# Patient Record
Sex: Female | Born: 1960 | Race: Black or African American | Hispanic: No | Marital: Married | Smoking: Never smoker
Health system: Southern US, Community
[De-identification: ages and names within clinical notes are randomized; demographics above are authoritative.]

## PROBLEM LIST (undated history)

## (undated) ENCOUNTER — Ambulatory Visit: Payer: MEDICAID

## (undated) ENCOUNTER — Ambulatory Visit: Payer: MEDICAID | Attending: Women's Health

## (undated) DIAGNOSIS — R920 Mammographic microcalcification found on diagnostic imaging of breast: Secondary | ICD-10-CM

## (undated) HISTORY — DX: Mammographic microcalcification found on diagnostic imaging of breast: R92.0

## (undated) HISTORY — PX: KNEE SURGERY: SHX244

## (undated) MED FILL — BUDESONIDE DR-ER 9 MG TABLET,DELAYED AND EXTENDED RELEASE: 9 mg | ORAL | 30 days supply | Qty: 30 | Fill #0

## (undated) MED FILL — BUDESONIDE DR-ER 9 MG TABLET,DELAYED AND EXTENDED RELEASE: 9 mg | ORAL | 30 days supply | Qty: 15 | Fill #1

## (undated) MED FILL — BUDESONIDE DR-ER 9 MG TABLET,DELAYED AND EXTENDED RELEASE: 9 mg | ORAL | 30 days supply | Qty: 30 | Fill #1

---

## 1997-04-10 HISTORY — PX: CHOLECYSTECTOMY: SHX55

## 2007-04-11 HISTORY — PX: COLONOSCOPY: SHX174

## 2007-12-13 ENCOUNTER — Ambulatory Visit: Payer: Self-pay | Admitting: Obstetrics and Gynecology

## 2012-09-17 ENCOUNTER — Encounter: Payer: Self-pay | Admitting: General Surgery

## 2012-09-25 ENCOUNTER — Ambulatory Visit (INDEPENDENT_AMBULATORY_CARE_PROVIDER_SITE_OTHER): Payer: BC Managed Care – PPO | Admitting: General Surgery

## 2012-09-25 ENCOUNTER — Encounter: Payer: Self-pay | Admitting: General Surgery

## 2012-09-25 VITALS — BP 124/68 | HR 76 | Resp 14 | Ht 67.5 in | Wt 186.0 lb

## 2012-09-25 DIAGNOSIS — Z803 Family history of malignant neoplasm of breast: Secondary | ICD-10-CM

## 2012-09-25 DIAGNOSIS — R928 Other abnormal and inconclusive findings on diagnostic imaging of breast: Secondary | ICD-10-CM

## 2012-09-25 NOTE — Patient Instructions (Addendum)
Continue self breast exams. Call office for any new breast issues or concerns. Continue annual follow up mammogram with primary MD

## 2012-09-25 NOTE — Progress Notes (Signed)
Patient ID: Sierra Clark, female   DOB: December 28, 1960, 52 y.o.   MRN: [id number]  Chief Complaint  Patient presents with  . Follow-up    mammogram    HPI Sierra Clark is a 52 y.o. female.  Who presents for her annual follow up breast evaluation. The most recent mammogram was done on  09-16-12.  Patient does perform regular self breast checks and gets regular mammograms done. Family history of breast cancer includes a mother at age 76.   HPI  Past Medical History  Diagnosis Date  . Mammographic microcalcification     left    Past Surgical History  Procedure Laterality Date  . Cholecystectomy  1999  . Colonoscopy  2009    Family History  Problem Relation Age of Onset  . Breast cancer Mother 45    Social History History  Substance Use Topics  . Smoking status: Never Smoker   . Smokeless tobacco: Never Used  . Alcohol Use: No    No Known Allergies  No current outpatient prescriptions on file.   No current facility-administered medications for this visit.    Review of Systems Review of Systems  Constitutional: Negative.   Respiratory: Negative.   Cardiovascular: Negative.     Blood pressure 124/68, pulse 76, resp. rate 14, height 5' 7.5" (1.715 m), weight 186 lb (84.369 kg), last menstrual period 07/01/2012.  Physical Exam Physical Exam  Constitutional: She appears well-developed and well-nourished.  Neck: Neck supple.  Cardiovascular: Normal rate and regular rhythm.   Pulmonary/Chest: Effort normal and breath sounds normal. Right breast exhibits no inverted nipple, no mass, no nipple discharge, no skin change and no tenderness. Left breast exhibits no inverted nipple, no mass, no nipple discharge, no skin change and no tenderness.  Lymphadenopathy:    She has no cervical adenopathy.    She has no axillary adenopathy.  Neurological: She is alert.  Skin: Skin is warm and dry.    Data Reviewed Bilateral mammogram dated September 16, 2012 was reviewed. These films  were completed at [facility]. No areas of architectural distortions or microcalcifications. BI-RAD-1.  Assessment    Benign breast exam.     Plan    The patient should continue annual clinical exams with her gynecologist as well as annual mammograms to be scheduled through that office.        Earline Mayotte 09/26/2012, 9:00 PM

## 2012-09-26 ENCOUNTER — Encounter: Payer: Self-pay | Admitting: General Surgery

## 2012-09-26 DIAGNOSIS — R928 Other abnormal and inconclusive findings on diagnostic imaging of breast: Secondary | ICD-10-CM | POA: Insufficient documentation

## 2014-01-20 ENCOUNTER — Ambulatory Visit: Payer: Self-pay | Admitting: Gastroenterology

## 2014-01-21 LAB — PATHOLOGY REPORT

## 2014-02-09 ENCOUNTER — Encounter: Payer: Self-pay | Admitting: General Surgery

## 2016-12-21 DIAGNOSIS — Z720 Tobacco use: Secondary | ICD-10-CM

## 2016-12-21 DIAGNOSIS — M542 Cervicalgia: Secondary | ICD-10-CM

## 2016-12-21 DIAGNOSIS — M19019 Primary osteoarthritis, unspecified shoulder: Secondary | ICD-10-CM

## 2017-02-22 DIAGNOSIS — N393 Stress incontinence (female) (male): Secondary | ICD-10-CM

## 2017-11-29 DIAGNOSIS — M130 Polyarthritis, unspecified: Secondary | ICD-10-CM

## 2018-03-12 ENCOUNTER — Other Ambulatory Visit: Payer: Self-pay | Admitting: Family Medicine

## 2018-03-12 DIAGNOSIS — E2839 Other primary ovarian failure: Secondary | ICD-10-CM

## 2018-05-08 ENCOUNTER — Ambulatory Visit
Admission: RE | Admit: 2018-05-08 | Discharge: 2018-05-08 | Disposition: A | Discharge status: Home / Self Care | Payer: BC Managed Care – PPO | Source: Ambulatory Visit | Attending: Family Medicine | Admitting: Family Medicine

## 2018-05-08 DIAGNOSIS — E2839 Other primary ovarian failure: Secondary | ICD-10-CM

## 2018-05-09 DIAGNOSIS — F331 Major depressive disorder, recurrent, moderate: Secondary | ICD-10-CM

## 2018-11-27 NOTE — Progress Notes (Deleted)
Chelsea Ball is a 58 y.o. female seen via telehealth for a new visit consultation with chief complaint of No chief complaint on file.   at the request of None Per Patient Provider.    I performed this consultation using real-time Telehealth tools, including a live video connection between my location and the patient's location. Prior to initiating the consultation, I obtained informed verbal consent to perform this consultation using Telehealth tools and answered all the questions about the Telehealth interaction.       History of Present Illness  This is a 58 y.o. female with history of depression, cholecystectomy, hysterectomy, substance use, homelessness who is referred to gastroenterology for left upper quadrant pain.     For constipation she has been prescribed metamucil, colace, lactulose, Fleet's enemas, Amitiza 11mg BID    For reflux she has been prescribed famotidine, omeprazole, pantoprazole, Creon, Dexilant, Aciphex, Nexium,     For pain she has been prescribed Creon, Bentyl, hyoscyamine, desipramin 25mBID, Donnatal, Norco, tizanidine    She has been prescribed prozac and lyrica for anxiety. Other psychiatric medications in medical history include Risperdal, Lyrica (neuropathy), desipramine    Her gastroenterologist Dr. [facility]im[facility] history of diverticulitis, bloating believed due to distended lumen in splenic flexure and descending colon). Per outside gastroenterologist, was unable to pass splenic flexure/descending colon diverticulosis at attempted colonoscopy.     No past medical history on file.   No past surgical history on file.      No family history on file.   There is ***no family history of GI or liver diseases or malignancies.   No outpatient medications have been marked as taking for the 11/27/18 encounter (Appointment) with DaGuido SanderMD.      Allergies/Contraindications  Allergies not on file     Review of Systems  Gen: No  fevers/chills, no weight change  Cardiovascular: No chest pain, no syncope  All other systems were reviewed and are negative.     Physical Exam:  Observed via video:  Constitutional: Patient appears well-developed and well-nourished. Pleasant and appropriately interactive.  Head: Normocephalic and atraumatic.  Eyes: Conjunctivae and EOM are normal. No scleral icterus  Neck: Normal range of motion, no visible thyroid enlargement  Pulmonary/Chest: Effort normal. No respiratory distress. No cough.  Cardiovascular: No visible edema, face and upper extremities appear well perfused  Abdomen: No abdominal distension.  Musculoskeletal: Appears normal, no swelling or deformity, no temporal wasting  Neurological: Alert and oriented to person, place, and time. Cranial nerves grossly intact.   Psychiatric: Normal mood and affect. Behavior is normal. Judgment and thought content normal.  Skin: Normal color, no visible rash.      I have personally reviewed the labs, imaging and other diagnostics below:     Latest [facility]  No results found for: HGB, WBC, MCV, PLT, INR, AST, ALT, TBILI, ALKP, ESR, CRP    Latest Outside Labs  01/04/18   Hgb 13.8, Plt 277  Cr 0.47  TSH 1.33    05/29/18  Hgb 12.0, Plt 229  AST 28, ALT 19, alk phos 74, Tbili 0.4  HIV negative  TSH 3.34    05/29/18 UTox: Cocaine metabolite positive  10/30/17 drug tox oral fluid cocaine negative, cotinine postive  12/25/16 cocaine metabolite detected  01/25/16 cocaine metabolite detected    Latest Imaging and Other Diagnostics  07/23/18 Colonoscopy (abdominal pain)- moderate sedation:  Advanced to transverse colon. Prep was adequate  Comments:  Unable to advance past distal transverse colon. Diverticulosis noted.     07/05/18 Abdominal ultrasound (RUQ pain)  Impression: Fatty infiltration of the liver but without evidence of hepatomegaly, splenomegaly, portal hypertension, focal solid mass lesion, biliary ductal dilatation, ascites nor varices. S/p cholecystectomy w/o evidence  of biliary ductal dilatation or choledocholithiasis.     Colonoscopy 10/20/15 per outside summary of healthcare screening needs, no report available.   Upper endoscopy 08/05/18 (no report available)    Assessment and Plan  Problem List:        Summary of Recommendations:         Patient will follow up at ***    An after visit summary was provided to the patient.     CC: None Per Patient Provider

## 2019-01-06 ENCOUNTER — Ambulatory Visit: Admit: 2019-01-06 | Discharge: 2019-01-07 | Payer: MEDICAID

## 2019-01-21 ENCOUNTER — Ambulatory Visit
Admit: 2019-01-21 | Payer: MEDICAID | Attending: Student in an Organized Health Care Education/Training Program | Primary: Physician

## 2019-01-21 DIAGNOSIS — K579 Diverticulosis of intestine, part unspecified, without perforation or abscess without bleeding: Secondary

## 2019-01-21 DIAGNOSIS — R109 Unspecified abdominal pain: Secondary | ICD-10-CM

## 2019-01-21 DIAGNOSIS — F329 Major depressive disorder, single episode, unspecified: Secondary | ICD-10-CM

## 2019-01-21 DIAGNOSIS — F319 Bipolar disorder, unspecified: Secondary | ICD-10-CM

## 2019-01-21 DIAGNOSIS — F32A Depression, unspecified: Secondary | ICD-10-CM

## 2019-01-21 DIAGNOSIS — R634 Abnormal weight loss: Secondary | ICD-10-CM

## 2019-01-21 MED ORDER — PEG 3350-ELECTROLYTES 236 GRAM-22.74 GRAM-6.74 GRAM-5.86 GRAM SOLUTION
Freq: Once | ORAL | 0 refills | Status: AC
Start: 2019-01-21 — End: 2019-01-21

## 2019-01-21 MED ORDER — ONDANSETRON HCL 8 MG TABLET
8 | ORAL_TABLET | ORAL | 0 refills | Status: AC
Start: 2019-01-21 — End: ?

## 2019-01-21 NOTE — Patient Instructions (Addendum)
-  Please schedule colonoscopy with anesthesia support, our office number is [phone]

## 2019-01-21 NOTE — Progress Notes (Signed)
Chelsea Ball is a 58 y.o. female seen via telehealth for a new visit consultation with chief complaint of Abdominal Pain   at the request of Derrill Center, DO.    I performed this consultation using real-time Telehealth tools, including a live video connection between my location and the patient's location. Prior to initiating the consultation, I obtained informed verbal consent to perform this consultation using Telehealth tools and answered all the questions about the Telehealth interaction. This visit was conducted over phone as patient does not have video capability.       History of Present Illness  This is a 58 y.o. female with history of irritable bowel syndrome with constipation, diverticulitis, cholecystectomy (2019), depression, substance use and hysterectomy who is referred to gastroenterology for diverticulosis with abdominal pain and incomplete colonoscopy.     Referred from Dr. Casimiro Needle in [place]. Consult question: "Patient with diverticulosis / diverticulitis (treated)... disorted lumen in splenic flexure and descending colon- Please evaluate for possible surgery?"    The patient describes her problem as having problems having a bowel movement. When she eats she has to eat some fruit to have a bowel movement and it can be runny. The patient reports weight loss because she gets severe stomach pain when she eats. The pain is happening on a daily basis. The entire stomach is painful, not one particular area- she feels she has to pant (breathe heavily) which can calm the pain a little bit. She reports that she has been put on bed rest by Dr. Terance Hart because of this. She has had weight loss related to eating less, she was 142 pounds, now down to 115 pounds. The only thing she can eat is soft foods, and reports she's not able to eat much. If she eats more she gets severe pain. She has some nausea but no vomiting. She reports being very weak now. She is also having rotator cuff problem. She  reports having bowel movements that are runny - they used to be small, solid, curved, but now have transitioned to being runny. Some days she does not have a bowel movement, and some days she will have a few runny bowel movements. She has some fear of eating. There is no blood in the stools.     She has not seen a nutritionist, does not take any nutrition shakes. She has stopped taking any of her medications because of the abdominal pain, uses Norco maybe once a week for rotator cuff, occasionally takes Calcium.    The patient reads me her medication list, although she is not curently taking these: Mirtazapine 15, megestrol 15, 72m hydroxyzine, 62mtizanadine PRN, calcium +vitamin D, docusate, fluoxetine, lamotrigine, albuterol, QVAR, Norco 5. She takes esomeprazole 4061ms needed for heartburn. She has tried FiberChoice tablets, didn't help her. Prior medicines from Dr. PetTerance Hartve her diarrhea. She tried miralax. She takes lactulose sometimes when constipated, helps her go. No enemas. Her prior medication list from June 2020 note of Dr. PetTerance Hartso mentions several PPI's, as well as metamucil, Creon, Amitiza, Bentyl, despiramine, Donnatal, fleet's enemas, but she reports prior medicines prescribed by Dr. PetTerance Hartst gave her diarrhea so she's not on them now.     She was seen in June by her gastroenterologist Dr. PetTerance Harto noted that he had attempted colonoscopy but was unable to advance past an area.     Her cholecystectomy did not help her pain in 2019.     Note, utox has been positive for cocaine  metabolites on 05/29/18, 12/25/16, ad 01/25/16.     No past medical history on file.   Past Surgical History:   Procedure Laterality Date    CHOLECYSTECTOMY      HYSTERECTOMY           Family history:  Father and grandmother with colon cancer, also uncles with colon cancer. Father was in his 17's when he got colon cancer.     Medications:   See HPI    Allergies/Contraindications  Allergies not on file      Social:  3 cigarettes per day  Very rare alcohol  Reports not having used drugs for years      Review of Systems  Gen: No fevers/chills, + weight loss as per HPI  Cardiovascular: No chest pain, no syncope  All other systems were reviewed and are negative.     Physical Exam:  No physical exam due to video visit    I have personally reviewed the labs, imaging and other diagnostics below:     Latest [facility]  No results found for: HGB, WBC, MCV, PLT, INR, AST, ALT, TBILI, ALKP, ESR, CRP    Latest Outside Labs  01/04/18 Hgb 13.8, Plt 277, WBC 3.4  Calcium 10.1  Lipase 25  HIV negative  TSH 1.33    05/29/18   Utox positive for cocaine metabolites  Hgb 12.0, Plt 229  TSH 3.34    Latest Imaging and Other Diagnostics      07/23/18 Colonoscopy:  Indication: Abdominal pain (Fentanyl 150 total, Versed 4 total)  Comments: Unable to pass distal transverse colon, diverticulosis noted.     07/05/18 Abdominal ultrasound:   Impression: Fatty infiltration of liver, but without evidence of hepatomegaly, splenomegaly, portal hypertension, focal solid mass lesion, biliary ductal dilatation, ascites nor varices.   S/p cholecystectomy without biliary dilatation or choledocholithiasis  Simple right renal cyst    EGD 08/05/18  Impression: Acute gastritis without bleeding  Pathology:  Antrum biopsy: Mild reactive gastropathy. No intestinal metplasia, H pylori, or neoplasia.    CT abdomen/pelvis with contrast 01/06/19:   Impression:   S/p cholecystectomy clips without evidence of biliary ductal dilatation or choledocholithiasis seen  S/p hysterectomy findings  Fatty infiltration of liver  Bilateral simple renal cysts, largest of which measures 11 x 16 x 27m in mid lower pole junction region of right kidney    01/04/18 CT abdomen/pelvis with IV contrast: (abdominal pain):  FINDINGS:  Mediastinum: A small hiatal hernia is present.    Liver: A small amount of focal fatty infiltration is seen in the liver adjacent  to the falciform ligament. No  suspicious masses are seen within the liver.  Gallbladder and bile ducts: The gallbladder has been removed.  Pancreas: Normal. No ductal dilation.  Spleen: Normal. No splenomegaly.  Adrenals: Normal. No mass.  Kidneys and ureters: The right kidney demonstrates a simple cyst in the lower  pole measuring 1.6 cm in diameter. The left kidney demonstrates a simple cyst  in the lower pole measuring 6 mm in diameter. There is no hydronephrosis. No  renal stones are identified.  Stomach and bowel: Unremarkable. No obstruction. No mucosal thickening.  Appendix: No evidence of appendicitis.  Intraperitoneal space: Unremarkable. No free air. No significant fluid  collection.  Vasculature: Unremarkable. No abdominal aortic aneurysm.  Lymph nodes: Unremarkable. No enlarged lymph nodes.    Bladder: Unremarkable as visualized.  Reproductive: Unremarkable as visualized.  Bones/joints: Unremarkable. No acute fracture.  Soft tissues: Unremarkable.  IMPRESSION:  Small  hiatal hernia.  Bilateral renal cysts.  No acute findings.  Colonic diverticulosis.    04/13/17 CT abd/pelvis with contrast:   IMPRESSION:  1. Small benign-appearing bilateral renal cysts.  2. Rectosigmoid diverticulosis.  3. 3 mm pulmonary nodule anterior right lung base. Recommend  followup as per Fleischner Society criteria.    01/02/14 CT abd/pelvis with IV contrast:   1. Colonic diverticulosis. Apparent mild circumferential wall thickening of    the mid sigmoid colon, which is favored to be artifactual, related to    nondistention. However, cannot fully exclude early acute sigmoid    diverticulitis or focal colitis. Recommend correlation with the patient's    clinical symptoms.   2. Echogenic foci with within the dependent gallbladder likely represent    gallstones. However, recommend followup with gallbladder ultrasound to fully    exclude the less likely possibility of a polypoid gallbladder lesion.   3. Tiny distal esophageal diverticulum.   4.  Noncalcified 3 mm right middle lobe pulmonary nodule. Recommend comparison    with any prior CT examinations that are available. If none are available,    recommend followup according to Fleischner criteria.    Colonoscopy 10/21/14: report not available    EGD 12/23/2014: report not available      Assessment and Plan  Problem List:  # Abdominal pain  # Weight loss  # Diverticulosis    This is a 58 y.o. female with history of irritable bowel syndrome with constipation, diverticulitis, cholecystectomy (2019), depression, substance use and hysterectomy who is referred to gastroenterology for diverticulosis with abdominal pain and incomplete colonoscopy.     She is having severe diffuse postprandial abdominal pain. This has led to her eating less with subsequent weight loss. Bowel movements have changed from small pieces to mostly liquid and are irregular. Her most recent colonoscopy April 2020 comments on diverticulosis and an inability to pass the distal transverse colon. The differential includes stricture (from diverticulosis or possibly repeated diverticulitis, although only one of her CT scans has shown findings suggestive of diverticulitis many years ago, versus stricture from ischemic colitis given her history of cocaine use), symptomatic uncomplicated diverticular disease, constipation with overflow diarrhea, malignancy (family history of colon cancer). 2019 CT scan with IV contrast reports unremarkable vascular so chronic mesenteric ischemia is not likely. Her CT scan September 2020 did not show large bowel obstruction so I believe prepping for a colonoscopy is safe.     I will repeat colonoscopy with anesthesia support which may allow passage of the narrowed or distorted area of the colon seen on her prior colonoscopy to clarify if there is evidence of fibrotic stricture, malignancy, or only diverticulosis leading to the narrowing/distortion. Pending these findings we can consider involvement of colorectal  surgery.     Summary of Recommendations:   -Low residue diet  -Recommended lactose free protein / nutrition shakes 1-2 per day if patient is able to obtain, to maintain nutrition  -Colonoscopy with anesthesia support    Patient will follow up at the time of colonoscopy.     An after visit summary was provided to the patient.     The patient requested this visit. I obtained consent from the patient to conduct this visit by telephone only. I spent a total of > 30 minutes. Total minutes: 35 minutes in audio communication with this patient.    CC: Casimiro Needle MD

## 2019-01-22 NOTE — Nursing Note (Signed)
Spoke to pt and confirmed procedure date-01/28/19 and arrival time-1130/ML-location/NPO status/driver required/prep received.       Instructed pt to adhere to clear liquid diet all day 01/27/19     ( AVOID drinks that are colored purple and red. Clear liquids include: water, clear juices (with no pulp), clear broth (beef, vegetable or chicken), coconut water, Gatorade, coffee or tea without milk, green/yellow jello. Honey and sugar are OK.)      Advised pt to start drinking 1st half of prep at 6 pm 01/27/19 and 2nd half at 0700 01/28/19. Stop all clear liquids including prep two hours prior to arrival time. Pt verbalized understanding.     Pre-op COVID-19 test will be ordered by Prepare team to be completed 3-4 days prior to procedure. Pt is aware.        Temporary Visitor Restrictions During the COVID-19 Emergency     Wed like to provide advance notice of additional protections our hospital has temporarily put in place for the safety of our patients and visitors, including you, your loved ones, and our healthcare providers.      Visitors are restricted from the hospital and our surgical waiting area will be closed to non-patients.   If you are not going home the day of surgery, we ask that your family members/visitors stay at home.   If you are going home the day of your surgery, your family member or person who will drive you home will be unable to accompany you to the preop / pacu area.  We strongly encourage your family member/driver to practice social distancing in the location they chose to wait.  If the driver lives in [place], we encourage them to return home to wait.       Patients will need to undergo a health screening on arrival and will not be allowed if they have symptoms of illness, including cough, runny nose, sneezing, fever, and sore throat.   Additionally, children under the age of 68 are restricted from the hospital.

## 2019-01-25 ENCOUNTER — Ambulatory Visit: Admit: 2019-01-25 | Payer: MEDICAID | Primary: Physician

## 2019-01-25 DIAGNOSIS — Z01818 Encounter for other preprocedural examination: Secondary | ICD-10-CM

## 2019-01-26 LAB — COVID-19 RNA, RT-PCR/NUCLEIC A: COVID-19 RNA, RT-PCR/Nucleic A: NOT DETECTED

## 2019-01-27 ENCOUNTER — Ambulatory Visit: Admit: 2019-01-27 | Discharge: 2019-01-27 | Payer: MEDICAID

## 2019-01-27 MED ORDER — ESOMEPRAZOLE MAGNESIUM 40 MG CAPSULE,DELAYED RELEASE
40 | ORAL | Status: AC
Start: 2019-01-27 — End: ?

## 2019-01-27 MED ORDER — CALCIUM CARBONATE 600 MG-VITAMIN D3 10 MCG (400 UNIT) TABLET
600-10400 mg-10 mcg (400 unit) | ORAL | Status: AC
Start: 2019-01-27 — End: ?

## 2019-01-27 MED ORDER — FLUOXETINE 20 MG CAPSULE
20 | ORAL | 1.00 refills | 30.00000 days | Status: AC
Start: 2019-01-27 — End: 2020-09-10

## 2019-01-27 MED ORDER — HYDROXYZINE HCL 25 MG TABLET
25 | ORAL | 0.00 refills | 30.00000 days | Status: AC
Start: 2019-01-27 — End: ?

## 2019-01-27 MED ORDER — PREMARIN 0.625 MG/GRAM VAGINAL CREAM
0.625 | VAGINAL | 2.00 refills | 60.00000 days | Status: AC
Start: 2019-01-27 — End: 2020-09-10

## 2019-01-27 MED ORDER — LAMOTRIGINE 100 MG TABLET
100 | ORAL | Status: AC
Start: 2019-01-27 — End: ?

## 2019-01-27 NOTE — Anesthesia Pre-Procedure Evaluation (Addendum)
[facility]  Anesthesia Preprocedure Evaluation    Procedure Information     Case:  [id number] Date/Time:  01/28/19 1235    Procedure:  ENDO ADULT COLONOSCOPY WITH BIOPSY (N/A )    Diagnosis:       Diverticulosis [K57.90]      Abdominal pain, unspecified abdominal location [R10.9]    Pre-op diagnosis:       Diverticulosis [K57.90]      Abdominal pain, unspecified abdominal location [R10.9]    Location:  [place] [facility] / [facility]    Surgeon:  Guido Sander, MD          Precautions          None          Relevant Problems   No relevant active problems       Previous Anesthesia Records Displaying the 5 most recent anesthesia records    Date   Procedure Department Responsible Provider Comp/Issues @ Handoff Comp/Issues @ Post Op Anesthesia Technique(s)    05/15/18  See report for details [facility] Operating Room - - - -    04/26/17  See report for details [facility] Periop - - - -       View all records in Chart Review            Anesthesia Encounter History        CC/HPI/Past Medical History Summary: Chelsea Ball is a 58 y.o. female with diverticulosis S/f ENDO ADULT COLONOSCOPY WITH BIOPSY (N/A ) on 01/28/19 by Dr. Chalmers Cater. At [facility]. 1/2 hour surgery under MAC anesthesia.       (Please refer to APeX Allergies, Problems, Past Medical History, Past Surgical History, Social History, and Family History activities, Results for current data from these respective sections of the chart; these sections of the chart are also summarized in reports, including the Patient Summary Extracts found in Chart Review)      Summary of Outside Records:  ~~~~~~~~~~~~~~~~~~~~~~~~    01/25/19; 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 Last addressed on 12/18/2012. Problem automatically mapped to SNOMED code "Eczema (19758832)" from [facility] Chronic Conditions table on 12/20/2013.      Gastroduodenitis 06/23/2011     Converted note: Onset date 06/23/2011. Last addressed on 03/29/2012. Problem automatically mapped to SNOMED code "Gastroduodenitis (549826415)" from [facility] Chronic Conditions table on  12/20/2013.      Moderate persistent asthma 04/10/2011     Converted note: Onset date 04/10/2011. Last addressed on 05/30/2013. Problem automatically mapped to SNOMED code "Moderate persistent asthma (676195093)" from [facility] Chronic Conditions table on 12/20/2013.      Back pain 06/22/2006     Past Medical History:   Diagnosis Date    Abdominal pain 01/21/2019    Bipolar 1 disorder (CMS code) 01/21/2019    Depression 01/21/2019    GERD (gastroesophageal reflux disease) 05/04/2016     Past Surgical History:   Procedure Laterality Date    CHOLECYSTECTOMY      HYSTERECTOMY       Current Medications       Dosage    calcium carbonate-vitamin D 600 mg(1,575m) -400 unit tablet TAKE 1 TABLET BY MOUTH EVERY DAY    esomeprazole (NEXIUM) 40 mg capsule TAKE 1 CAPSULE BY MOUTH EVERY DAY    FLUoxetine (PROZAC) 20 mg capsule      hydrOXYzine (ATARAX) 25 mg tablet     lamoTRIgine (LAMICTAL) 100 mg tablet     ondansetron (ZOFRAN) 8 mg tablet Take 8 mg po every 6-8 hours as needed for nausea related to colonoscopy prep.   To be taken as needed for nausea ( see colonoscopy prep instructions for details)    PREMARIN vaginal cream PLACE 0.5 G VAGINALLY NIGHTLY AT BEDTIME        Allergies as of 01/27/2019  Never Reviewed   No Known Allergies       Social History     Socioeconomic History    Marital status: Widowed     Spouse name: Not on file    Number of children: Not on file    Years of education: Not on file    Highest education level: Not on file   Occupational History    Not on file   Social Needs    Financial resource strain: Not on file    Food insecurity:     Worry: Not on file     Inability: Not on file    Transportation needs:     Medical: Not on file     Non-medical: Not on file   Tobacco Use    Smoking status: Not on file   Substance and Sexual Activity    Alcohol use: Not on file    Drug use: Not on file    Sexual activity: Not on file   Lifestyle    Physical activity:     Days per week: Not on file     Minutes per session: Not on file    Stress: Not on file   Relationships    Social connections:     Talks on phone: Not on file     Gets together: Not on file     Attends religious service: Not on file     Active member of club or organization: Not on file     Attends meetings of clubs or organizations: Not on file     Relationship status: Not on file    Intimate partner violence:     Fear of current or ex partner: Not on file     Emotionally abused: Not on file     Physically abused: Not on file     Forced sexual activity: Not on file   Other Topics Concern    Not on file   Social History Narrative    Not on file     Review  of Systems Functional Status: Climb a flight of stairs or walk up a hill (5.50 METs)   Respiratory: Negative.    Cardiovascular: Negative.    Gastrointestinal: Positive for abdominal pain.  Negative for Negative for GERD symptoms.       Physical Exam    Airway:   Modified Mallampati score: II. Thyromental distance: > 6.5 cm. Mouth opening: good. Neck range of motion: full.   Constitutional:       Appearance: She is well-developed and well-nourished.   HENT:      Mouth/Throat:      Dentition: None present.   Cardiovascular:      Rate and Rhythm: Normal rate and regular rhythm.   Pulmonary:      Effort: Pulmonary effort is normal.      Breath sounds: Normal breath sounds.   Neurological:   Level of consciousness: alert.      Dental: Patient is edentulous. Patient has upper dentures and lower dentures.       There were no vitals taken for this visit.      Prepare (Pre-Operative Clinic) Assessment/Plan/Narrative  Prepare Clinic consult type: Chart review  01/27/19-LM that patient can continue clear liquid until 2 hours prior to arrival time. Will call pt in the morning to complete interview.  Pablo Ledger, NP  01/27/19-1440, 1600, 1630-Left messages at Cell: [phone] to return call to complete PC. Patient is having problem obtaining bowel prep, so she was unable to talk.  Pablo Ledger, NP      Obstructive Sleep Apnea Screening  STOPBANG Score:      S Do you Snore loudly (louder than talking or loud  enough to be heard through closed doors)? No    T Do you often feel Tired, fatigued, or sleepy during daytime? No    O Has anyone Observed you stop breathing during  your sleep? No    P Do you have or are you being treated for high blood Pressure? No    B BMI more than 35 KG/m^2? No    A Age over 75 years old? Yes    N Neck circumference > 16 inches (40cm)? No    G Gender: Female? No    STOPBANG Total Score 1         Risk Level (based only on STOPBANG) Low       CPAP/BiPAP prescribed - No.              Anesthesia Assessment and Plan  Day Of Surgery Provider Chart Review:  NPO status verified  Medications reviewed  Allergies reviewed  Problem list reviewed  Anesthesia history reviewed  Pertinent labs reviewed  Consults  reviewed    ASA 2       Anesthesia Plan  Anesthesia Type: MAC  Induction Technique: IntraVenous  Invasive Monitors/Vascular Access: None  Airway Plan: mask only  Possible Advanced Airway Techniques: None  Other Techniques: None  Planned Recovery Location: Endoscopy Recovery Area    Blood Product PreparationBlood Products Plan: N/A, minimal risk    Anesthesia Potential Complication Discussion  At increased or higher than average risk of: Aspiration and Post-operative nausea and vomiting  There is the possibility of rare but serious complications.    Informed Consent for Anesthesia  Consent obtained from patient    Risks, benefits and alternatives including those of invasive monitoring discussed. Increased risks (as above) discussed.  Questions invited and all answered.  Interpreter: N/A - patient/guardian's preferred language is [place].  Consent granted for anesthetic  plan    Quality Measure Documentation   Opioid Therapy Planned? No    (See Anesthesia Record for attending attestation)    [Please note, smart link data included in this note may not reflect changes since note creation. Please see appropriate section of APeX for up-to-the minute information.]

## 2019-01-27 NOTE — H&P (Signed)
Procedure Planned: Colonoscopy  Diagnosis: Diverticulosis    Past Medical History:   Diagnosis Date    Abdominal pain 01/21/2019    Bipolar 1 disorder (CMS code) 01/21/2019    Depression 01/21/2019    GERD (gastroesophageal reflux disease) 05/04/2016     Past Surgical History:   Procedure Laterality Date    CHOLECYSTECTOMY      HYSTERECTOMY       Allergies/Contraindications  Allergies no known allergies   No medications prior to admission.       Social History  She      Family History  family history is not on file.   Family history is otherwise negative or as noted above.      Physical Exam:  Vss:  There were no vitals taken for this visit.   Wt Readings from Last 3 Encounters:   No data found for Wt      Mallampati Score: 2  ASA Classification: 3  Constitutional: Well-appearing.  No acute distress.  Eyes: Normal eyelids and conjunctivae, pupils equal round and reactive to light  Ears, Nose, Mouth, Throat: Atraumatic, normocephalic, normal lips, teeth, and gum, moist mucous membranes  Neck:  Neck supple, no lymphadenopathy  Respiratory:  Normal respiratory effort, no accessory muscle use or intercostal retraction, no dullness to percussion, clear to auscultation bilaterally  Cardiovascular:  Regular rate and rhythm, normal s1/s2, no lower extremity edema  Gastrointestinal:  Soft, nontender, nondistended, no masses, no hepatosplenomegaly, no guarding, no rebound   Hem/Lymphatic: No lymphadenopathy of neck or axillae  Muskuloskeletal: No clubbing or cyanosis of hands.    Skin:  No rashes, ulcers or lesions.  No nodules, scaling or induration.  Neurologic:  Gait normal  Psychiatric: Oriented to time, place, and person.  Normal mood and affect.     I have personally reviewed the following blood tests listed below:     Latest [facility]:  No results found for: WBC, RBC, HGB, HCT, MCV, MCH, MCHC, PLT, CBCD  No results found for: ALT, NALT, ANA6, AST, ALKP, DBILI, TBILI, ANA4, TBILWB, GGT, GGTEXL, GGTEXQ, ANA5   No  results found for: AMY  No results found for: LIPA  No results found for: CRP, CRPEXL, CRPEXQ       I have reviewed the studies below     Latest imaging and other diagnostics:      07/23/18 Colonoscopy:  Indication: Abdominal pain (Fentanyl 150 total, Versed 4 total)  Comments: Unable to pass distal transverse colon, diverticulosis noted.     07/05/18 Abdominal ultrasound:   Impression: Fatty infiltration of liver, but without evidence of hepatomegaly, splenomegaly, portal hypertension, focal solid mass lesion, biliary ductal dilatation, ascites nor varices.   S/p cholecystectomy without biliary dilatation or choledocholithiasis  Simple right renal cyst    EGD 08/05/18  Impression: Acute gastritis without bleeding  Pathology:  Antrum biopsy: Mild reactive gastropathy. No intestinal metplasia, H pylori, or neoplasia.    CT abdomen/pelvis with contrast 01/06/19:   Impression:   S/p cholecystectomy clips without evidence of biliary ductal dilatation or choledocholithiasis seen  S/p hysterectomy findings  Fatty infiltration of liver  Bilateral simple renal cysts, largest of which measures 11 x 16 x 74mm in mid lower pole junction region of right kidney    01/04/18 CT abdomen/pelvis with IV contrast: (abdominal pain):  FINDINGS:  Mediastinum: A small hiatal hernia is present.    Liver: A small amount of focal fatty infiltration is seen in the liver adjacent  to the falciform  ligament. No suspicious masses are seen within the liver.  Gallbladder and bile ducts: The gallbladder has been removed.  Pancreas: Normal. No ductal dilation.  Spleen: Normal. No splenomegaly.  Adrenals: Normal. No mass.  Kidneys and ureters: The right kidney demonstrates a simple cyst in the lower  pole measuring 1.6 cm in diameter. The left kidney demonstrates a simple cyst  in the lower pole measuring 6 mm in diameter. There is no hydronephrosis. No  renal stones are identified.  Stomach and bowel: Unremarkable. No obstruction. No mucosal  thickening.  Appendix: No evidence of appendicitis.  Intraperitoneal space: Unremarkable. No free air. No significant fluid  collection.  Vasculature: Unremarkable. No abdominal aortic aneurysm.  Lymph nodes: Unremarkable. No enlarged lymph nodes.    Bladder: Unremarkable as visualized.  Reproductive: Unremarkable as visualized.  Bones/joints: Unremarkable. No acute fracture.  Soft tissues: Unremarkable.  IMPRESSION:  Small hiatal hernia.  Bilateral renal cysts.  No acute findings.  Colonic diverticulosis    _______________________________  ASSESSMENT AND PLAN: 58 y.o. female with history of irritable bowel syndrome with constipation, diverticulitis, cholecystectomy (2019), depression, substance use and hysterectomy who is referred recently to [facility] gastroenterology for diverticulosis with abdominal pain after incomplete colonoscopy, now scheduled for repeat colonoscopy given concerns for possible stricture.     Plan for: Anesthesia Consult    Immediate Pre-Sedation Assessment Completed including response to Pre-Medication.    Airway Status Unchanged - Cleared for Sedation and Procedure    DOCUMENTATION OF INFORMED CONSENT  I have discussed the risks, benefits, and alternatives of the procedure and sedation with the patient and/or the patient's medical decision-maker.  This discussion included, but was not limited to, the risk of bleeding, infection, damage to anatomical structures, need for reoperation, or even death.  The patient and/or the patient's medical decision maker understands, has had all of his/her questions answered, and desires to proceed.  Informed consent obtained.

## 2019-01-27 NOTE — Patient Instructions (Signed)
We want your upcoming surgical visit to be as safe and comfortable as possible. The following instructions are designed to prepare you for your surgical hospitalization.  Please read and follow all instructions carefully.    Procedure Location  Your procedure is scheduled to take place at [place]: [place]. 1st Floor Report to Room L103 at assigned ARRIVAL time noted below. Phone [phone]    Procedure Date and Time  Please arrive on  01/28/2019.       Contact your surgeon's office if you have not received your time of arrival for the day surgery    If for any reason your surgery start time is changed, you will receive a call from your surgeons office.  Should you have any questions regarding the date or time of arrival, please call your surgeons office and ask to speak with his/her practice assistant.    Preparing for Surgery  What can I eat or drink on the day of surgery?   Please do not have anything to eat or drink except clear liquids after midnight the evening before your surgery (including gum, candy or mints).   You may have clear liquids on the day of surgery up to 2 hours prior to arrival.   Clear liquids include:   Non-pulp, clear apple juice.   Tea with sugar or sweetener (NO milk, cream, or milk substitute)   Gatorade   Water   If you drink anything other than clear liquids on the day of surgery, or if you have ANYTHING to drink in the two hours prior to hospital arrival, then your doctors will cancel your surgery for your safety.   If you have been instructed to take any medications the day of surgery, take them with a small sip of water.     If your surgeon has given you additional instructions about what to eat or drink before the day of surgery, please follow those instructions as well.    Which medications should I take on the day of surgery?   Lew Dawes   Prior to Surgery Medication Instructions NID:[id number]    Printed on:01/27/19  1657   Medication Information Take Morning of Surgery Special Instructions          calcium carbonate-vitamin D 600 mg(1,500mg ) -400 unit tablet  (calcium carbonate/vitamin D3)  TAKE 1 TABLET BY MOUTH EVERY DAY   DO NOT TAKE MORNING OF SURGERY       esomeprazole (NEXIUM) 40 mg capsule  (esomeprazole magnesium)  TAKE 1 CAPSULE BY MOUTH EVERY DAY   TAKE MORNING OF SURGERY       FLUoxetine (PROZAC) 20 mg capsule  (fluoxetine HCl)     TAKE MORNING OF SURGERY       hydrOXYzine (ATARAX) 25 mg tablet  (hydroxyzine HCl)          lamoTRIgine (LAMICTAL) 100 mg tablet  (lamotrigine)     TAKE MORNING OF SURGERY       ondansetron (ZOFRAN) 8 mg tablet  (ondansetron HCl)  Take 8 mg po every 6-8 hours as needed for nausea related to colonoscopy prep.   To be taken as needed for nausea ( see colonoscopy prep instructions for details)   TAKE MORNING OF SURGERY, IF NEEDED       PREMARIN vaginal cream  (estrogens, conjugated)  PLACE 0.5 G VAGINALLY NIGHTLY AT BEDTIME    TAKE NIGHT BEFORE SURGERY          Preparing to  Come to the Hospital   Showering Instructions:   Shower the morning of surgery. After showering, do not apply lotion, cream, powder, deodorant, or hair conditioner.    Do not shave or remove body hair. Shaving your face is generally fine. If you are having head surgery, however, ask your doctor whether you can shave.     Wear casual, loose fitting, comfortable clothing and leave all valuables, including jewelry, and large sums of cash at home.   Leave your valuables at home. Belongings that remain with you are your responsibility. [facility] is not liable for loss or damage. If valuables are brought to the hospital, they will be identified and recorded by our admitting team. All jewelry must be removed and left at home. Otherwise, removal will occur in the Pre-operative department and may result in damage of the jewelry.  This policy protects the patient and prevents the items from being lost or damaged.    Leave contact  lenses at home. Wear your eyeglasses and bring a case.   If you develop any illness prior to surgery (fever, cough, sore throat, cold, flu, infection), OR START A NEW MEDICATION, please call your surgeon and the [facility] at [phone].   If you are spending the night you may bring toiletries and sleeping clothes if you desire; otherwise the hospital will provide them for you.    DO NOT bring your medications with you to the hospital unless you were specifically instructed to do so.   DO bring a list of your medications including dose(s) and when you take them.   DO bring TWO forms of ID - including one ID with a photo.    Leaving the Hospital   Please ask your surgeon about your anticipated length of stay.   It is recommended that all patients have a responsible person at home the first night after discharge from the hospital.   ALL patients, including same day surgery patients, must arrange for an adult to drive/escort them home upon discharge.  Patients going home the same day of surgery will have their procedure cancelled if these arrangements are not made ahead of time.    Family and Friends  Friends and family may wait in the assigned waiting areas.  Patient care coordinators are available in these patient waiting areas to provide updates regarding patient progress; hospital room assignments; and discharge planning (for same day surgery).    [facility] End[facility], [address]    Guided Imagery  Research has shown that listening to guided imagery is helpful for many health conditions.   offers these sessions to listen to at the following website: [email or web address]     Please consider guided imagery to prepare for surgery and for coping with stress, sleep and pain.        Temporary Visitor Restrictions During the COVID-19 Emergency    Wed like to provide advance notice of additional protections our hospital  has temporarily put in place for the safety of our patients and visitors, including you, your loved ones, and our healthcare providers.     Visitors are restricted from the hospital and our surgical waiting area will be closed to non-patients.   If you are not going home the day of surgery, we ask that your family members/visitors stay at home.   If you are going home the day of your surgery, your family member or person who will drive you home will be unable to accompany you  to the preop / pacu area.  We strongly encourage your family member/driver to practice social distancing in the location they chose to wait.  If the driver lives in [place], we encourage them to return home to wait.       Patients will need to undergo a health screening on arrival and will not be allowed if they have symptoms of illness, including cough, runny nose, sneezing, fever, and sore throat.   Additionally, children under the age of 60 are restricted from the hospital.    Thank you for your cooperation.     FAQ     1.     What if I or the person who is scheduled to take me home following my procedure has one of the symptoms you stated?   Please attempt to secure a ride home with a different friend or family member. If thats not possible, please contact your surgeons office immediately to inquire whether or not it is appropriate to reschedule the procedure.     2.     Why are children not allowed?   This is a precaution put in place by our clinical leadership and intended to ensure the safety of our patients. Younger children can carry viruses even when they are not symptomatic.

## 2019-01-28 DIAGNOSIS — K5790 Diverticulosis of intestine, part unspecified, without perforation or abscess without bleeding: Secondary | ICD-10-CM

## 2019-01-28 DIAGNOSIS — R109 Unspecified abdominal pain: Secondary | ICD-10-CM

## 2019-01-28 MED ORDER — FENTANYL (PF) 50 MCG/ML INJECTION SOLUTION
50 | INTRAMUSCULAR | Status: DC | PRN
Start: 2019-01-28 — End: 2019-01-28
  Administered 2019-01-28: 20:00:00 via INTRAVENOUS

## 2019-01-28 MED ORDER — PROPOFOL 10 MG/ML INTRAVENOUS EMULSION
10 | INTRAVENOUS | Status: DC | PRN
Start: 2019-01-28 — End: 2019-01-28
  Administered 2019-01-28: 20:00:00 250 via INTRAVENOUS

## 2019-01-28 MED ORDER — PROPOFOL 10 MG/ML INTRAVENOUS EMULSION
10 | INTRAVENOUS | Status: DC | PRN
Start: 2019-01-28 — End: 2019-01-28
  Administered 2019-01-28: 21:00:00 via INTRAVENOUS
  Administered 2019-01-28 (×2): 20 via INTRAVENOUS

## 2019-01-28 MED ORDER — LIDOCAINE (PF) 10 MG/ML (1 %) INJECTION SOLUTION: 10 mg/mL (1 %) | INTRAMUSCULAR | Status: DC

## 2019-01-28 MED ORDER — ALBUTEROL SULFATE HFA 90 MCG/ACTUATION AEROSOL INHALER: 90 mcg/actuation | RESPIRATORY_TRACT | Status: AC | PRN

## 2019-01-28 MED ORDER — ONDANSETRON HCL (PF) 4 MG/2 ML INJECTION SOLUTION
4 | Freq: Four times a day (QID) | INTRAMUSCULAR | Status: DC | PRN
Start: 2019-01-28 — End: 2019-01-28

## 2019-01-28 MED ORDER — PHENYLEPHRINE 10 MG/ML INJECTION SOLUTION
10 | INTRAMUSCULAR | Status: DC | PRN
Start: 2019-01-28 — End: 2019-01-28
  Administered 2019-01-28: 20:00:00 100 via INTRAVENOUS
  Administered 2019-01-28: 20:00:00 via INTRAVENOUS
  Administered 2019-01-28: 21:00:00 100 via INTRAVENOUS

## 2019-01-28 MED ORDER — SODIUM CHLORIDE 0.9 % INTRAVENOUS SOLUTION
0.9 % | INTRAVENOUS | Status: DC
  Administered 2019-01-28: 19:00:00 100 mL/h via INTRAVENOUS
  Administered 2019-01-28: 20:00:00 via INTRAVENOUS

## 2019-01-28 MED ORDER — ACETAMINOPHEN 1,000 MG/100 ML (10 MG/ML) INTRAVENOUS SOLUTION
1000 | Freq: Once | INTRAVENOUS | Status: DC | PRN
Start: 2019-01-28 — End: 2019-01-28

## 2019-01-28 NOTE — Anesthesia Post-Procedure Evaluation (Addendum)
Anesthesia Post-op Evaluation    Scheduled date of Operation: 01/28/2019    Scheduled Surgeon(s):Daniel Heriberto Antigua, MD  Scheduled Procedure(s):ENDO ADULT COLONOSCOPY WITH POLYPECTOMYENDO ADULT COLONOSCOPY WITH BIOPSY    Final Anesthesia Type: MAC    Assessment  Respiratory Function:      Airway Patency: Excellent      Respiratory Rate: See vitals below      SpO2: See vitals below      Overall Respiratory Assessment: Stable  Cardiovascular Function:      Pulse Rate: See Vitals Below      Blood Pressure: See Vitals Below      Cardiac status: Stable  Mental Status:      RASS Score: -1 Drowsy, Not fully alert, but has sustained (more than 10 seconds) awakening, with eye contact, to voice  Temperature: Normothermic  Pain Control: Adequate  Nausea and Vomiting: Absent  Fluids/Hydration Status: Euvolemic    Complications (anesthesia/case associated complications, possible complications, and/or significant issues; as of time of note completion: No apparent complications      Plan  Follow-up care: As per primary team    Post-op Note Status: Complete, patient participated in evaluation, which occurred after recovery from anesthesia but prior to 48 hours from end of case      OB Perinatal DB          Recent Pre-op and Post-op Vital Signs  Vitals:    01/28/19 1205 01/28/19 1352 01/28/19 1400   BP: _0   Pulse: 83 79 78   Resp: _1 Temp: 36.1 C (97 F) 36 C (96.8 F)    TempSrc: Temporal Temporal    SpO2: 100% 100% 100%     Last Vital Signs Out of Room to Anesthesia Stop  Vitals Value Taken Time   Pulse     Resp     SpO2     BP     Arterial Line BP (mmHg)      Arterial Line MAP (mmHg)     Arterial Line 2 BP (mmHg)     Arterial Line 2 MAP (mmHg)     Temp     Temp src         Case Tracking Events:  Event Time In Time Out   In Facility 1125    In [place] 1126    In Pre-op 1153    Anesthesia Start 1251    Anesthesia Ready 1311    In Room 1302    Procedure Start 1312    Procedure Finish 1540    Out of Room 1348     In PACU 1350    Anesthesia Finish 1351

## 2019-02-02 MED ORDER — MESALAMINE 1.2 GRAM TABLET,DELAYED RELEASE
1.2 | ORAL_TABLET | Freq: Every day | ORAL | 1 refills | Status: DC
Start: 2019-02-02 — End: 2019-02-28

## 2019-02-02 NOTE — Telephone Encounter (Signed)
I called the patient to discuss her biopsy and colonoscopy results and discuss next steps.     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 Endoscopic appearance was suggestive of SCAD but there was no inflammation on biopsies. Either way I recommend at trial of treatment with mesalamine 2.4g daily so I explained this to her along with possible side effects of the medicine. Will set her up with a follow up visit with me.     She will need a surveillance colonoscopy in 3 years due to polyps.     Eustace Pen MD

## 2019-02-11 MED ORDER — MESALAMINE 400 MG CAPSULE (WITH DELAYED RELEASE TABLETS INSIDE)
400 | ORAL_CAPSULE | Freq: Three times a day (TID) | ORAL | 1 refills | Status: DC
Start: 2019-02-11 — End: 2019-02-28

## 2019-02-11 NOTE — Telephone Encounter (Signed)
Current pended order:  sulfaSALAzine (AZULFIDINE) 500 mg tablet [Pharmacy Med Name: SULFASALAZINE 500 MG TABLET]  Please specify directions, refills and quantity    Last time approved:  mesalamine (DELZICOL) 400 mg DR capsule  Take 2 capsules (800 mg total) by mouth 3 (three) times daily  Dispense: 90 capsule Refill: 1  [facility] 0175 2 by SELVIG, DANIEL ROBIN on 02/11/19      Last appointment: Visit date not found  Next appointment: Visit date not found    Last  BP:  BP Readings from Last 3 Encounters:   01/28/19 92/57       Last Labs:    No results found for: CREAT, NA, K, A1C, CHOL, LDL, HDL, TSH, HGB, GFRC, GFRAA, POCTHGBA1C    If patient has not been seen in clinic for 12 months or more I have routed encounter to the administrative team to book a follow-up appointment.

## 2019-02-12 MED ORDER — BALSALAZIDE 750 MG CAPSULE
750 | ORAL_CAPSULE | Freq: Three times a day (TID) | ORAL | 2 refills | Status: DC
Start: 2019-02-12 — End: 2020-09-10

## 2019-02-28 ENCOUNTER — Ambulatory Visit
Admit: 2019-02-28 | Payer: MEDICAID | Attending: Student in an Organized Health Care Education/Training Program | Primary: Physician

## 2019-02-28 DIAGNOSIS — R109 Unspecified abdominal pain: Secondary | ICD-10-CM

## 2019-02-28 DIAGNOSIS — K6389 Other specified diseases of intestine: Secondary | ICD-10-CM

## 2019-02-28 DIAGNOSIS — K5790 Diverticulosis of intestine, part unspecified, without perforation or abscess without bleeding: Secondary | ICD-10-CM

## 2019-02-28 DIAGNOSIS — K5010 Crohn's disease of large intestine without complications: Secondary | ICD-10-CM

## 2019-02-28 DIAGNOSIS — R634 Abnormal weight loss: Secondary | ICD-10-CM

## 2019-02-28 MED ORDER — RIFAXIMIN 550 MG TABLET
550 | ORAL_TABLET | Freq: Three times a day (TID) | ORAL | 0 refills | Status: AC
Start: 2019-02-28 — End: 2019-03-24
  Filled 2019-03-10: qty 42, 14d supply, fill #0

## 2019-02-28 NOTE — Telephone Encounter (Signed)
Specialty Pharmacy   Prior Authorization Note    [place]:[id number]    PA: pending    Pharmacy:[facility]   Insurance info: [place] on 02/28/19 via covermymeds         Clinton Quant,  Specialty Pharmacy Technician  Pharmacy:[phone]

## 2019-02-28 NOTE — Progress Notes (Signed)
Chelsea Ball is a 58 y.o. female seen via telehealth for a follow up visit  with chief complaint of Diverticulitis, Irritable bowel syndrome with constipation, Abdominal Pain, and Acute gastritis   at the request of Derrill Center, DO.    I performed this consultation using real-time Telehealth tools, including a live video connection between my location and the patient's location. Prior to initiating the consultation, I obtained informed verbal consent to perform this consultation using Telehealth tools and answered all the questions about the Telehealth interaction. This visit was conducted over phone as patient does not have video capability.       History of Present Illness  This is a 58 y.o. female with history of irritable bowel syndrome with constipation, diverticulitis, cholecystectomy (2019), depression, substance use and hysterectomy who is referred to gastroenterology for diverticulosis with abdominal pain and incomplete colonoscopy, following up after colonoscopy was able to be completed.     Background history:  Referred from Dr. Casimiro Needle in [place]. Consult question: "Patient with diverticulosis / diverticulitis (treated)... disorted lumen in splenic flexure and descending colon- Please evaluate for possible surgery?"    The patient describes her problem as having problems having a bowel movement. When she eats she has to eat some fruit to have a bowel movement and it can be runny. The patient reports weight loss because she gets severe stomach pain when she eats. The pain is happening on a daily basis. The entire stomach is painful, not one particular area- she feels she has to pant (breathe heavily) which can calm the pain a little bit. She reports that she has been put on bed rest by Dr. Terance Hart because of this. She has had weight loss related to eating less, she was 142 pounds, now down to 115 pounds. The only thing she can eat is soft foods, and reports she's not able to eat much. If  she eats more she gets severe pain. She has some nausea but no vomiting. She reports being very weak now. She is also having rotator cuff problem. She reports having bowel movements that are runny - they used to be small, solid, curved, but now have transitioned to being runny. Some days she does not have a bowel movement, and some days she will have a few runny bowel movements. She has some fear of eating. There is no blood in the stools.     She has not seen a nutritionist, does not take any nutrition shakes. She has stopped taking any of her medications because of the abdominal pain, uses Norco maybe once a week for rotator cuff, occasionally takes Calcium.    The patient reads me her medication list, although she is not curently taking these: Mirtazapine 15, megestrol 15, 84m hydroxyzine, 635mtizanadine PRN, calcium +vitamin D, docusate, fluoxetine, lamotrigine, albuterol, QVAR, Norco 5. She takes esomeprazole 4055ms needed for heartburn. She has tried FiberChoice tablets, didn't help her. Prior medicines from Dr. PetTerance Hartve her diarrhea. She tried miralax. She takes lactulose sometimes when constipated, helps her go. No enemas. Her prior medication list from June 2020 note of Dr. PetTerance Hartso mentions several PPI's, as well as metamucil, Creon, Amitiza, Bentyl, despiramine, Donnatal, fleet's enemas, but she reports prior medicines prescribed by Dr. PetTerance Hartst gave her diarrhea so she's not on them now.     She was seen in June by her gastroenterologist Dr. PetTerance Harto noted that he had attempted colonoscopy but was unable to advance past an area.  Her cholecystectomy did not help her pain in 2019.     Note, utox has been positive for cocaine metabolites on 05/29/18, 12/25/16, ad 01/25/16.     Interval Events (02/28/19 phone visit):   At my initial clinic visit on 01/21/19, I planned to repeat colonoscopy with anesthesia. This was performed 01/28/19 and was able to be completed to the cecum, with  polyps and with severe diverticulosis with endoscopic appearance of SCAD but with pathology showing mucosal prolapse. I started the patient on mesalamine 2.4g daily.     At first she was having small bowel movements with postprandial abdominal pain. She is still having thin stools and postprandial discomfort that begins even right when she begins to eat a meal that has caused some fear of eating larger meals. Now at our 02/28/19 visit she is a little better but not much. The mesalamine has given her sour stomach after taking it for a week so she has stopped it. Now she reports walking more after being off bed rest and is having still thin bowel movements. She reports still not eating very well but thinks she has not lost further weight. She has not filled other medications with her PCP at this point. She is back on her psych meds. She takes opioids but only rarely, not every day.     Past Medical History:   Diagnosis Date    Abdominal pain 01/21/2019    Bipolar 1 disorder (CMS code) 01/21/2019    Depression 01/21/2019    GERD (gastroesophageal reflux disease) 05/04/2016      Past Surgical History:   Procedure Laterality Date    CHOLECYSTECTOMY      HYSTERECTOMY        Social History     Tobacco Use    Smoking status: Current Every Day Smoker     Years: 8.00    Smokeless tobacco: Never Used    Tobacco comment: 3 cigarettes/day   Substance and Sexual Activity    Alcohol use: Yes     Comment: holidays only    Drug use: Not Currently     Comment: cocaine 7 years ago     Family history:  Father and grandmother with colon cancer, also uncles with colon cancer. Father was in his 70's when he got colon cancer.     Medications:   See HPI    Allergies/Contraindications  No Known Allergies     Social:  3 cigarettes per day  Very rare alcohol  Reports not having used drugs for years      Review of Systems  Gen: No fevers/chills, reports no weight loss since last visit  Cardiovascular: No chest pain, no syncope  All  other systems were reviewed and are negative.     Physical Exam:  No physical exam due to video visit    I have personally reviewed the labs, imaging and other diagnostics below:     Latest [place] [facility]  No results found for: HGB, WBC, MCV, PLT, INR, AST, ALT, TBILI, ALKP, ESR, CRP    Latest Outside Labs  01/04/18 Hgb 13.8, Plt 277, WBC 3.4  Calcium 10.1  Lipase 25  HIV negative  TSH 1.33    05/29/18   Utox positive for cocaine metabolites  Hgb 12.0, Plt 229  TSH 3.34    Latest Imaging and Other Diagnostics  01/28/19 colonoscopy ([place])  IMPRESSIONS:   1. Two sessile polyps were found in the ascending   colon and at the cecum; polypectomy was  performed with cold forceps   2. 4 mm sessile polyp was found in the sigmoid colon; polypectomy was   performed using a cold snare   3. Two sessile polyps were found in the rectum; polypectomy was   performed with a cold snare   4. There was severe diverticulosis in the sigmoid colon, ranging from   18cm to 30cm from the anal verge (measured on withdrawal with the   colon straight and short). 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 This was biopsied for   histology   5. Retroflexed views revealed no abnormalities     FINAL PATHOLOGIC DIAGNOSIS  A. Right colon, polyps, biopsy: Tubular adenoma.       B. Sigmoid polyp, biopsy: Sessile serrated adenoma.  C. Sigmoid, biopsy: Mucosal prolapse.   D. Rectal polyps, biopsy: Sessile serrated adenoma, fragments.       07/23/18 Colonoscopy:  Indication: Abdominal pain (Fentanyl 150 total, Versed 4 total)  Comments: Unable to pass distal transverse colon, diverticulosis noted.     07/05/18 Abdominal ultrasound:   Impression: Fatty infiltration of liver, but without evidence of hepatomegaly, splenomegaly, portal hypertension, focal solid mass lesion, biliary ductal dilatation, ascites nor varices.   S/p cholecystectomy  without biliary dilatation or choledocholithiasis  Simple right renal cyst    EGD 08/05/18  Impression: Acute gastritis without bleeding  Pathology:  Antrum biopsy: Mild reactive gastropathy. No intestinal metplasia, H pylori, or neoplasia.    CT abdomen/pelvis with contrast 01/06/19:   Impression:   S/p cholecystectomy clips without evidence of biliary ductal dilatation or choledocholithiasis seen  S/p hysterectomy findings  Fatty infiltration of liver  Bilateral simple renal cysts, largest of which measures 11 x 16 x 51m in mid lower pole junction region of right kidney    01/04/18 CT abdomen/pelvis with IV contrast: (abdominal pain):  FINDINGS:  Mediastinum: A small hiatal hernia is present.    Liver: A small amount of focal fatty infiltration is seen in the liver adjacent  to the falciform ligament. No suspicious masses are seen within the liver.  Gallbladder and bile ducts: The gallbladder has been removed.  Pancreas: Normal. No ductal dilation.  Spleen: Normal. No splenomegaly.  Adrenals: Normal. No mass.  Kidneys and ureters: The right kidney demonstrates a simple cyst in the lower  pole measuring 1.6 cm in diameter. The left kidney demonstrates a simple cyst  in the lower pole measuring 6 mm in diameter. There is no hydronephrosis. No  renal stones are identified.  Stomach and bowel: Unremarkable. No obstruction. No mucosal thickening.  Appendix: No evidence of appendicitis.  Intraperitoneal space: Unremarkable. No free air. No significant fluid  collection.  Vasculature: Unremarkable. No abdominal aortic aneurysm.  Lymph nodes: Unremarkable. No enlarged lymph nodes.    Bladder: Unremarkable as visualized.  Reproductive: Unremarkable as visualized.  Bones/joints: Unremarkable. No acute fracture.  Soft tissues: Unremarkable.  IMPRESSION:  Small hiatal hernia.  Bilateral renal cysts.  No acute findings.  Colonic diverticulosis.    04/13/17 CT abd/pelvis with contrast:   IMPRESSION:  1. Small benign-appearing  bilateral renal cysts.  2. Rectosigmoid diverticulosis.  3. 3 mm pulmonary nodule anterior right lung base. Recommend  followup as per Fleischner Society criteria.    01/02/14 CT abd/pelvis with IV contrast:   1. Colonic diverticulosis. Apparent mild circumferential wall thickening of    the mid sigmoid colon, which is  favored to be artifactual, related to    nondistention. However, cannot fully exclude early acute sigmoid    diverticulitis or focal colitis. Recommend correlation with the patient's    clinical symptoms.   2. Echogenic foci with within the dependent gallbladder likely represent    gallstones. However, recommend followup with gallbladder ultrasound to fully    exclude the less likely possibility of a polypoid gallbladder lesion.   3. Tiny distal esophageal diverticulum.   4. Noncalcified 3 mm right middle lobe pulmonary nodule. Recommend comparison    with any prior CT examinations that are available. If none are available,    recommend followup according to Fleischner criteria.    Colonoscopy 10/21/14: report not available    EGD 12/23/2014: report not available      Assessment and Plan  Problem List:  # Abdominal pain  # Weight loss  # Diverticulosis    This is a 57 y.o. female with history of irritable bowel syndrome with constipation, diverticulitis, cholecystectomy (2019), depression, substance use and hysterectomy who is referred to gastroenterology for diverticulosis with abdominal pain and incomplete colonoscopy, subsequently able to be completed on 01/28/19 showing severe diverticulosis with endoscopic appearance suggestive of SCAD.     Overall I suspect she has either segmental colitis associated with diverticulosis (appearance was suggestive of this, but biopsies did not show infammation) or symptomatic uncomplicated diverticular disease. Her severe symptoms and weight loss may also reflect an underlying component of irritable bowel syndrome as well. While she has risk factors for  ischemic colitis, CT scans have shown patent vasculature. We have tried mesalamine but she did not tolerate it due to nausea after using it for 1 week. I will try giving a course of rifaximin, as in addition to SCAD there may be small intestinal bacterial overgrowth (or if unable to obtain it with insurance, a one week course of metronidazole). Alternative treatments can include oral Uceris or VSL #3. She has not previously had any improvement with stool softeners. If all else fails surgery can be considered although it is not certain that this will completely resolve her symptoms.       Summary of Recommendations:   -Stop mesalamine (already discontinued due to nausea  -I have prescribed a course of rifaximin (if not able to obtain, will recommend 7 days of metronidazole 225m TID after confirming patient not drinking any alcohol)  -Future treatments could include Uceris    Patient will follow up via phone visit in 1 month.     An after visit summary was provided to the patient.     The patient requested this visit. I obtained consent from the patient to conduct this visit by telephone only. I spent a total of 21-30 minutes minutes in audio communication with this patient.    CC: RCasimiro NeedleMD

## 2019-02-28 NOTE — Patient Instructions (Signed)
I have prescribed a course of rifaximin, which is a non-absorbed antibiotic that may help with the intestines and the digestion. It is taken 3 times per day for 2 weeks.     I want to have another telephone visit with you in 1 month. You can also notify our clinic at [phone] after you have tried the antibiotic medicine to let us know how it has worked.

## 2019-03-04 NOTE — Telephone Encounter (Signed)
Specialty Pharmacy   Prior Authorization Note    JH:[id number]    PA: approved   Pharmacy:[facility]   Insurance info: [place] on 02/28/19 via covermymeds     [facility] will reach out to patient     Clinton Quant,  Specialty Pharmacy Technician  Pharmacy:[phone]

## 2019-04-19 ENCOUNTER — Emergency Department: Payer: BC Managed Care – PPO

## 2019-04-19 ENCOUNTER — Emergency Department
Admission: EM | Admit: 2019-04-19 | Discharge: 2019-04-19 | Disposition: A | Discharge status: Home / Self Care | Payer: BC Managed Care – PPO | Attending: Student | Admitting: Student

## 2019-04-19 ENCOUNTER — Other Ambulatory Visit: Payer: Self-pay

## 2019-04-19 DIAGNOSIS — Y939 Activity, unspecified: Secondary | ICD-10-CM | POA: Insufficient documentation

## 2019-04-19 DIAGNOSIS — Y92410 Unspecified street and highway as the place of occurrence of the external cause: Secondary | ICD-10-CM | POA: Insufficient documentation

## 2019-04-19 DIAGNOSIS — S72432A Displaced fracture of medial condyle of left femur, initial encounter for closed fracture: Secondary | ICD-10-CM | POA: Insufficient documentation

## 2019-04-19 DIAGNOSIS — S79922A Unspecified injury of left thigh, initial encounter: Secondary | ICD-10-CM | POA: Diagnosis present

## 2019-04-19 DIAGNOSIS — Y999 Unspecified external cause status: Secondary | ICD-10-CM | POA: Insufficient documentation

## 2019-04-19 MED ORDER — HYDROMORPHONE HCL 1 MG/ML IJ SOLN
1.0000 mg | Freq: Once | INTRAMUSCULAR | Status: AC
  Administered 2019-04-19: 15:00:00 1 mg via INTRAMUSCULAR
  Filled 2019-04-19: qty 1

## 2019-04-19 MED ORDER — OXYCODONE-ACETAMINOPHEN 5-325 MG PO TABS
1.0000 | ORAL_TABLET | Freq: Once | ORAL | Status: AC
  Administered 2019-04-19: 1 via ORAL
  Filled 2019-04-19: qty 1

## 2019-04-19 MED ORDER — KETOROLAC TROMETHAMINE 60 MG/2ML IM SOLN
60.0000 mg | Freq: Once | INTRAMUSCULAR | Status: AC
  Administered 2019-04-19: 60 mg via INTRAMUSCULAR
  Filled 2019-04-19: qty 2

## 2019-04-19 MED ORDER — OXYCODONE-ACETAMINOPHEN 7.5-325 MG PO TABS: 1.0000 | ORAL_TABLET | Freq: Four times a day (QID) | ORAL | 0 refills | Status: AC | PRN

## 2019-04-19 MED ORDER — ORPHENADRINE CITRATE 30 MG/ML IJ SOLN
60.0000 mg | Freq: Two times a day (BID) | INTRAMUSCULAR | Status: DC
  Administered 2019-04-19: 60 mg via INTRAMUSCULAR
  Filled 2019-04-19: qty 2

## 2019-04-19 MED ORDER — IBUPROFEN 600 MG PO TABS: 600.0000 mg | ORAL_TABLET | Freq: Three times a day (TID) | ORAL | 0 refills | Status: DC | PRN

## 2019-04-19 MED ORDER — LIDOCAINE 5 % EX PTCH
1.0000 | MEDICATED_PATCH | CUTANEOUS | Status: DC
  Administered 2019-04-19: 1 via TRANSDERMAL
  Filled 2019-04-19: qty 1
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
## 2019-04-19 NOTE — Discharge Instructions (Addendum)
Follow discharge care instruction take medication as directed.  Ambulate with knee immobilizer and crutches until evaluation by orthopedics.  Call Monday morning at 830 and tell them you are follow-up from the emergency room.

## 2019-04-25 MED ORDER — BUDESONIDE DR-ER 9 MG TABLET,DELAYED AND EXTENDED RELEASE
9 | ORAL_TABLET | Freq: Every day | ORAL | 3 refills | Status: DC
Start: 2019-04-25 — End: 2019-04-29

## 2019-04-25 NOTE — Telephone Encounter (Signed)
Patient called clinic reporting ongoing symptoms despite rifaximin.     I recommend a trial of Uceris as described in my last clinic note. I have prescribed a 30 day trial of this medication to [facility] [place] [facility] [employer]     I tried calling the patient twice to discuss but number was busy; will have Doreen Beam or other RN try calling the patient to inform her.    Percell Boston MD

## 2019-04-25 NOTE — Telephone Encounter (Signed)
Reports reports return of "episodes" of intermittent/diffuse cramping abdominal pain around umbilicus and severe pain below umbilicus, abdominal bloating, not being able to poop normally and occasional nausea as of 1/9.    Reports some improvement of lower abdominal pain after BMs.    Has 0-3 small/thin BMs daily.    Last BM was 1/14 and was small/worm thin.    Denies severe pain now, blood in stool/black tarry stools, fever/chills, vomiting.    Pt afraid to eat during the day due to the pain so not eating much.   has 1 taco in the evening or cereal at night.    Completed 2 week course of rifaximin 2 weeks ago.   Had some improvement initially but symptoms have returned.    Pt understands to go to ED if severe abdominal pain.  She's agreeable to schedule phone visit as MD recommended 02/2019.    If Labs[facility]  Ok to [facility] with MD's recs or LM for appt.          ----- Message from Thurston Hole sent at 04/24/2019  2:17 PM PST -----  Regarding: patient symptoms  Hi Berlene Dixson,    This patient called asking for advice on her symptoms asap. She said she has severe stomach pain and her BM's are "worm" thin. Her call back number is [phone] and it is ok to leave a detailed message.    -Jana Half

## 2019-04-25 NOTE — Telephone Encounter (Signed)
-----   Message from Thurston Hole sent at 04/24/2019  2:17 PM PST -----  Regarding: patient symptoms  Hi Melaney Tellefsen,    This patient called asking for advice on her symptoms asap. She said she has severe stomach pain and her BM's are "worm" thin. Her call back number is [phone] and it is ok to leave a detailed message.    -Jana Half

## 2019-04-29 NOTE — Telephone Encounter (Signed)
Informed pt provider's comments below.  Per pt she thought we were going to reach out to her to schedule follow up 1 month after 11/20 appt and she was frustrated we hadn't reached out to her as of yet.    I advised pt when an MD recommends follow up appointment it is the pt's responsibility to contact the clinic to schedule the appt once they know what days/times work for them. our practice does reach out to pts to schedule appointments but since this pt is overdue for a visit the doctor asked a scheduler reach out to arrange follow up.    provided pt the clinic number to schedule an appt per her request though she understands our office will reach out to her this week to schedule follow.      Ivory Broad, MD  Doreen Beam, RN    Cc: Leighton Parody   Caller: Unspecified (4 days ago, 3:17 PM)            Hi Alizae Bechtel,   Can you try calling the patient to inform her I have prescribed a trial of 30 days of Uceris (taken once per day in the morning) [facility] [employer] in [place] I tried to call twice but no answer.     Lynnea Ferrier, can you also arrange a follow up telephone visit with the patient?

## 2019-04-30 MED ORDER — BUDESONIDE DR-ER 9 MG TABLET,DELAYED AND EXTENDED RELEASE
9 | ORAL_TABLET | Freq: Every day | ORAL | 1 refills | Status: DC
Start: 2019-04-30 — End: 2019-06-03
  Filled 2019-06-17: qty 30, 30d supply, fill #0

## 2019-05-01 NOTE — Telephone Encounter (Signed)
Specialty Pharmacy   Prior Authorization Note    Rx: Budesonide  PA: pending    Pharmacy:[facility]   Insurance info: [employer]   Submitted on 05/01/19 via covermymeds     (Key: [id number])        Chaney Malling,  Specialty Pharmacy Technician  Pharmacy:[phone]

## 2019-05-05 NOTE — Telephone Encounter (Signed)
Specialty Pharmacy   Prior Authorization Note    GO:[id number]    PA: denied, need more medical justification    Pharmacy:[facility]   Insurance info: [employer]

## 2019-05-21 ENCOUNTER — Ambulatory Visit
Admit: 2019-05-22 | Discharge: 2019-05-27 | Payer: MEDICAID | Attending: Student in an Organized Health Care Education/Training Program | Primary: Physician

## 2019-05-21 DIAGNOSIS — K5790 Diverticulosis of intestine, part unspecified, without perforation or abscess without bleeding: Secondary | ICD-10-CM

## 2019-05-21 DIAGNOSIS — R634 Abnormal weight loss: Secondary | ICD-10-CM

## 2019-05-21 DIAGNOSIS — R109 Unspecified abdominal pain: Secondary | ICD-10-CM

## 2019-05-21 NOTE — Progress Notes (Signed)
Chelsea Ball is a 59 y.o. female seen via telehealth for a follow up visit  with chief complaint of Irritable Bowel Syndrome, Abdominal Pain, and Diverticulitis   at the request of Derrill Center, DO.    The patient requested this visit. I obtained consent from the patient to conduct this visit by telephone only. I spent a total of 21-30 minutes in audio communication with this patient.      History of Present Illness  This is a 59 y.o. female with history of irritable bowel syndrome with constipation, diverticulitis, cholecystectomy (2019), depression, substance use and hysterectomy who is referred to gastroenterology for diverticulosis with abdominal pain and incomplete colonoscopy, following up after colonoscopy was able to be completed.     Background history:  Referred from Dr. Casimiro Needle in [place]. Consult question: "Patient with diverticulosis / diverticulitis (treated)... disorted lumen in splenic flexure and descending colon- Please evaluate for possible surgery?"    The patient describes her problem as having problems having a bowel movement. When she eats she has to eat some fruit to have a bowel movement and it can be runny. The patient reports weight loss because she gets severe stomach pain when she eats. The pain is happening on a daily basis. The entire stomach is painful, not one particular area- she feels she has to pant (breathe heavily) which can calm the pain a little bit. She reports that she has been put on bed rest by Dr. Terance Hart because of this. She has had weight loss related to eating less, she was 142 pounds, now down to 115 pounds. The only thing she can eat is soft foods, and reports she's not able to eat much. If she eats more she gets severe pain. She has some nausea but no vomiting. She reports being very weak now. She is also having rotator cuff problem. She reports having bowel movements that are runny - they used to be small, solid, curved, but now have transitioned to  being runny. Some days she does not have a bowel movement, and some days she will have a few runny bowel movements. She has some fear of eating. There is no blood in the stools.     She has not seen a nutritionist, does not take any nutrition shakes. She has stopped taking any of her medications because of the abdominal pain, uses Norco maybe once a week for rotator cuff, occasionally takes Calcium.    The patient reads me her medication list, although she is not curently taking these: Mirtazapine 15, megestrol 15, 78m hydroxyzine, 61mtizanadine PRN, calcium +vitamin D, docusate, fluoxetine, lamotrigine, albuterol, QVAR, Norco 5. She takes esomeprazole 4050ms needed for heartburn. She has tried FiberChoice tablets, didn't help her. Prior medicines from Dr. PetTerance Hartve her diarrhea. She tried miralax. She takes lactulose sometimes when constipated, helps her go. No enemas. Her prior medication list from June 2020 note of Dr. PetTerance Hartso mentions several PPI's, as well as metamucil, Creon, Amitiza, Bentyl, despiramine, Donnatal, fleet's enemas, but she reports prior medicines prescribed by Dr. PetTerance Hartst gave her diarrhea so she's not on them now.     She was seen in June by her gastroenterologist Dr. PetTerance Harto noted that he had attempted colonoscopy but was unable to advance past an area.     Her cholecystectomy did not help her pain in 2019.     Note, utox has been positive for cocaine metabolites on 05/29/18, 12/25/16, ad 01/25/16.     02/28/19  phone visit:  At my initial clinic visit on 01/21/19, I planned to repeat colonoscopy with anesthesia. 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 I started the patient on mesalamine 2.4g daily.     At first she was having small bowel movements with postprandial abdominal pain. She is still having thin stools and  postprandial discomfort that begins even right when she begins to eat a meal that has caused some fear of eating larger meals. At our 02/28/19 visit she is a little better but not much. The mesalamine has given her sour stomach after taking it for a week so she has stopped it. Now she reports walking more after being off bed rest and is having still thin bowel movements. She reports still not eating very well but thinks she has not lost further weight. She has not filled other medications with her PCP at this point. She is back on her psych meds. She takes opioids but only rarely, not every day.     Interval Events (05/21/19 visit):  At our 04/29/18 video visit, she was given a course of rifaximin with some improvement initially but symptoms returned. She was prescribed a course of Uceris    At our 05/21/19 visit, she reports symptoms are getting worse. She has frequent small volume diarrhea, sometimes with urges to have a bowel movement but nothing comes out. When she does have bowel movments, it tends to be small bits or runny. She has had some episodes of incontinence. Some days she goes to the bathroom every 20 or 30 minutes and bowel movements are still very thin- bowel movements are like very long worms if it's not watery. Her abdominal pain is severe- reports it is worse than labor pains, intermittent spasms affecting the whole abdomen - she has to lay still and focus on her breathing to get the pain improve. It involves the whole abdomen and tends to happen when she is on the toilet trying to have a bowel movement. She continues to be afraid to eat because when she eats she usually has to go to the bathroom within 10-15 minutes for a bowel movement and then she gest the pain while on the toilet. She changes her body position (leaning backwards) sometimes helps a little stool come out. She continues to lose weight, she thinks now down to maybe 100 pounds although does not have a scale to confirm this.     Past  Medical History:   Diagnosis Date    Abdominal pain 01/21/2019    Bipolar 1 disorder (CMS code) 01/21/2019    Depression 01/21/2019    GERD (gastroesophageal reflux disease) 05/04/2016      Past Surgical History:   Procedure Laterality Date    CHOLECYSTECTOMY      HYSTERECTOMY        Social History     Tobacco Use    Smoking status: Current Every Day Smoker     Years: 8.00    Smokeless tobacco: Never Used    Tobacco comment: 3 cigarettes/day   Substance and Sexual Activity    Alcohol use: Yes     Comment: holidays only    Drug use: Not Currently     Comment: cocaine 7 years ago     Family history:  Father and grandmother with colon cancer, also uncles with colon cancer. Father was in his 57's  when he got colon cancer.     Medications:   See HPI    Allergies/Contraindications  No Known Allergies     Social:  3 cigarettes per day  Very rare alcohol  Reports not having used drugs for years      Review of Systems  Gen: No fevers/chills, reports no weight loss since last visit  Cardiovascular: No chest pain, no syncope  All other systems were reviewed and are negative.     Physical Exam:  No physical exam due to video visit    I have personally reviewed the labs, imaging and other diagnostics below:     Latest [facility]  No results found for: HGB, WBC, MCV, PLT, INR, AST, ALT, TBILI, ALKP, ESR, CRP    Latest Outside Labs  01/04/18 Hgb 13.8, Plt 277, WBC 3.4  Calcium 10.1  Lipase 25  HIV negative  TSH 1.33    05/29/18   Utox positive for cocaine metabolites  Hgb 12.0, Plt 229  TSH 3.34    Latest Imaging and Other Diagnostics  01/28/19 colonoscopy ([facility])  IMPRESSIONS:   1. Two sessile polyps were found in the ascending   colon and at the cecum; polypectomy was performed with cold forceps   2. 4 mm sessile polyp was found in the sigmoid colon; polypectomy was   performed using a cold snare   3. Two sessile polyps were found in the rectum; polypectomy was   performed with a cold snare   4. There was severe  diverticulosis in the sigmoid colon, ranging from   18cm to 30cm from the anal verge (measured on withdrawal with the   colon straight and short). 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 This was biopsied for   histology   5. Retroflexed views revealed no abnormalities     FINAL PATHOLOGIC DIAGNOSIS  A. Right colon, polyps, biopsy: Tubular adenoma.       B. Sigmoid polyp, biopsy: Sessile serrated adenoma.  C. Sigmoid, biopsy: Mucosal prolapse.   D. Rectal polyps, biopsy: Sessile serrated adenoma, fragments.       07/23/18 Colonoscopy:  Indication: Abdominal pain (Fentanyl 150 total, Versed 4 total)  Comments: Unable to pass distal transverse colon, diverticulosis noted.     07/05/18 Abdominal ultrasound:   Impression: Fatty infiltration of liver, but without evidence of hepatomegaly, splenomegaly, portal hypertension, focal solid mass lesion, biliary ductal dilatation, ascites nor varices.   S/p cholecystectomy without biliary dilatation or choledocholithiasis  Simple right renal cyst    EGD 08/05/18  Impression: Acute gastritis without bleeding  Pathology:  Antrum biopsy: Mild reactive gastropathy. No intestinal metplasia, H pylori, or neoplasia.    CT abdomen/pelvis with contrast 01/06/19:   Impression:   S/p cholecystectomy clips without evidence of biliary ductal dilatation or choledocholithiasis seen  S/p hysterectomy findings  Fatty infiltration of liver  Bilateral simple renal cysts, largest of which measures 11 x 16 x 65m in mid lower pole junction region of right kidney    01/04/18 CT abdomen/pelvis with IV contrast: (abdominal pain):  FINDINGS:  Mediastinum: A small hiatal hernia is present.    Liver: A small amount of focal fatty infiltration is seen in the liver adjacent  to the falciform ligament. No suspicious masses are seen within the liver.  Gallbladder and bile ducts: The  gallbladder has been removed.  Pancreas: Normal. No ductal dilation.  Spleen: Normal. No splenomegaly.  Adrenals: Normal. No mass.  Kidneys and ureters: The right kidney demonstrates a simple cyst in the lower  pole measuring 1.6 cm in diameter. The left kidney demonstrates a simple cyst  in the lower pole measuring 6 mm in diameter. There is no hydronephrosis. No  renal stones are identified.  Stomach and bowel: Unremarkable. No obstruction. No mucosal thickening.  Appendix: No evidence of appendicitis.  Intraperitoneal space: Unremarkable. No free air. No significant fluid  collection.  Vasculature: Unremarkable. No abdominal aortic aneurysm.  Lymph nodes: Unremarkable. No enlarged lymph nodes.    Bladder: Unremarkable as visualized.  Reproductive: Unremarkable as visualized.  Bones/joints: Unremarkable. No acute fracture.  Soft tissues: Unremarkable.  IMPRESSION:  Small hiatal hernia.  Bilateral renal cysts.  No acute findings.  Colonic diverticulosis.    04/13/17 CT abd/pelvis with contrast:   IMPRESSION:  1. Small benign-appearing bilateral renal cysts.  2. Rectosigmoid diverticulosis.  3. 3 mm pulmonary nodule anterior right lung base. Recommend  followup as per Fleischner Society criteria.    01/02/14 CT abd/pelvis with IV contrast:   1. Colonic diverticulosis. Apparent mild circumferential wall thickening of    the mid sigmoid colon, which is favored to be artifactual, related to    nondistention. However, cannot fully exclude early acute sigmoid    diverticulitis or focal colitis. Recommend correlation with the patient's    clinical symptoms.   2. Echogenic foci with within the dependent gallbladder likely represent    gallstones. However, recommend followup with gallbladder ultrasound to fully    exclude the less likely possibility of a polypoid gallbladder lesion.   3. Tiny distal esophageal diverticulum.   4. Noncalcified 3 mm right middle lobe pulmonary nodule. Recommend comparison    with any  prior CT examinations that are available. If none are available,    recommend followup according to Fleischner criteria.    Colonoscopy 10/21/14: report not available    EGD 12/23/2014: report not available      Assessment and Plan  Problem List:  # Abdominal pain  # Weight loss  # Diverticulosis    This is a 59 y.o. female with history of irritable bowel syndrome with constipation, diverticulitis, cholecystectomy (2019), depression, substance use and hysterectomy who is referred to gastroenterology for diverticulosis with abdominal pain.     Her outside gastroenterologist was not able to complete a colonoscopy in 2020 due to the severity of her diverticulosis. Subsequent colonoscopy with anesthesia here at [facility] 01/28/19 was able to be completed but with significant difficulty showing quite severe diverticulosis with endoscopic appearance suggestive of SCAD (however biopsies just showed mucosal prolapse, not inflammation).     Overall I do suspect that her severe and worsening abdominal symptoms are due to extreme sigmoid diverticulosis leading to chronic partial large bowel obstruction symptoms. Her stools have become progressively smaller and narrower, now just small thin pieces or loose/liquid, and she has problems both with incontinence of liquid stool and inability to pass stools at all at varying times. She has severe cramping abdominal pain preceding attempts at bowel movements. She has decreased her food intake because eating leads to the need to have bowel movements which then leads to the extreme pain. She has had weight loss of around 30-40 pounds in the last year or so due to the severity of the symptoms. Treatment with antibiotics and mesalamine has not been successful, but since I believe mechanical distortion rather than inflammation is the cause of the symptoms I  do not think further medical treatments are likely to be of much help.     I have referred the patient to colorectal surgery for  consideration of resection of the area of severe diverticulosis.       Summary of Recommendations:   -Referral to colorectal surgery for consideration of resection of severe diverticulosis which I suspect is causing chronic partial large bowel obstruction    I will call the patient to check in on her in 1 week.     An after visit summary was provided to the patient.     The patient requested this visit. I obtained consent from the patient to conduct this visit by telephone only. I spent a total of 21-30 minutes minutes in audio communication with this patient.    CC: Casimiro Needle MD

## 2019-05-21 NOTE — Patient Instructions (Signed)
I will refer you to the surgeons for discussion of possible colon surgery.

## 2019-05-28 NOTE — Telephone Encounter (Signed)
Gastroenterology Clinic Telephone Contact Note    I called Chelsea Ball to follow up on our clinic visit from last week. Her symptoms are the same. She has not heard from colorectal surgery so I advised her to call [phone] to ask about scheduling of the appointment.       Percell Boston MD  Assistant Professor  Gastroenterology

## 2019-06-03 MED ORDER — BUDESONIDE DR-ER 9 MG TABLET,DELAYED AND EXTENDED RELEASE
9 | ORAL_TABLET | Freq: Every day | ORAL | 1 refills | Status: DC
Start: 2019-06-03 — End: 2019-07-10

## 2019-06-05 ENCOUNTER — Ambulatory Visit: Admit: 2019-06-12 | Discharge: 2019-06-17 | Payer: MEDICAID | Attending: Physician | Primary: Physician

## 2019-06-05 DIAGNOSIS — K5790 Diverticulosis of intestine, part unspecified, without perforation or abscess without bleeding: Secondary | ICD-10-CM

## 2019-06-05 DIAGNOSIS — K50119 Crohn's disease of large intestine with unspecified complications: Secondary | ICD-10-CM

## 2019-06-05 DIAGNOSIS — R634 Abnormal weight loss: Secondary | ICD-10-CM

## 2019-06-05 NOTE — Telephone Encounter (Signed)
Specialty Pharmacy   Prior Authorization Note    HM:[id number]    resubmitted PA    Pharmacy:[facility]   Insurance info: [facility]   Submitted on 06/05/19 via covermymeds   Chelsea Ball (Key: [id number])      Chaney Malling,  Specialty Pharmacy Technician  [phone]

## 2019-06-05 NOTE — H&P (Signed)
Chelsea Ball presents todayto the [place] for evaluation of segmental colitis associated diverticulosis.    HISTORY OF PRESENT ILLNESS:  Chelsea Ball is a 59 year old woman with history of chronic abdominal pain, depression, substance use (cocaine), and sigmoid diverticulosis presenting today via video for surgical evaluation. This is a new problem for me to evaluate.  History of present illness:  She reports a greater than one year long history of abdominal pain and bowel urgency and increased bowel frequency.   She reports multiple bowel movements per day and that she cannot leave her house due to the number of bowel movements.   She reports daily abdominal pain that is debilitating. She reports nothing has helped her pain and that she has already tried many medications with her GI without relief. She reports she has not tried steroids as of yet.  She reports that her pain is not always improved with BM. She reports that she does not always produce a BM when she is in the bathroom.   She reports her pain and frequent BMs have affected her quality of life. She reports she cannot leave the home due to the pain and BMs. She reports she is miserable right now and that she has to do something to try to help her issues. She reports she is no longer on norco for pain and is only on muscle relaxants presently for pain.   H/o incomplete colonoscopy in April 2020 (to transverse colon; unsure why incomplete) that demonstrated diverticulosis of the large intestine, which was repeated with anesthesia on 01/28/19 and found severe diverticulosis with endoscopic appearance of SCAD but with pathology showing mucosal prolapse.   She reports she smokes 1 pack of cigarettes per week. She reports she can walk a flight of stairs but that she would be out of breath doing it.   She reports significant weight loss. She is on a dairy-free diet.   Prior surgical history:   cholecystectomy (2019)    Hysterectomy   Imaging:  01/06/19 CT abd pel with contrast (images available for review)  Impressions:  Status post cholecystectomy clips without evidence of biliary ductal dilatation or choledocholithiasis seen.   Status post hysterectomy findings.  Fatty infiltration of liver.  Bilateral, simple renal cysts, the largest of which measures 11 x 16 x 13 mm in the mid-lower pole junction region of the right kidney.   Procedures:  01/28/19 Colonoscopy (complete)  IMPRESSIONS:   1. Two sessile polyps were found in the ascending colon and at the cecum; polypectomy was performed with cold forceps  2. 4 mm sessile polyp was found in the sigmoid colon; polypectomy was performed using a cold snare   3. Two sessile polyps were found in the rectum; polypectomy was performed with a cold snare   4. There was severe diverticulosis in the sigmoid colon, ranging from 18cm to 30cm from the anal verge (measured on withdrawal with the   colon straight and short). There were a few areas of patchy erythema in the mucosa between the diverticula, most suggestive of Segmental   Colitis associated with Diverticulosis (SCAD). This was biopsied for histology5. Retroflexed views revealed no abnormalities   FINAL PATHOLOGIC DIAGNOSIS  A. Right colon, polyps, biopsy: Tubular adenoma.   B. Sigmoid polyp, biopsy: Sessile serrated adenoma.   C. Sigmoid, biopsy: Mucosal prolapse.   D. Rectal polyps, biopsy: Sessile serrated adenoma, fragments.   COMMENT:   The trichrome stain on block C1 confirm focal lamina propria fibrosis;  however, no activity is seen. Level sections on blocks B1-D1 confirm the diagnoses.     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, right    Osteoarthritis of shoulder    Paresthesia and pain of both upper extremities    Polyarthritis    Schizophrenia (CMS code)    Stress incontinence in female    Tobacco user    Vitamin D deficiency    Abdominal pain    History of colonoscopy       Past Medical History:   Diagnosis Date    Abdominal pain 01/21/2019    Bipolar 1 disorder (CMS code) 01/21/2019    Depression 01/21/2019    GERD (gastroesophageal reflux disease) 05/04/2016       Past Surgical History:   Procedure Laterality Date    CHOLECYSTECTOMY      HYSTERECTOMY          Allergies/Contraindications  No Known Allergies    Current Outpatient Medications   Medication Sig Dispense Refill    albuterol 90 mcg/actuation metered dose inhaler Inhale into the lungs every 6 (six) hours as needed for Wheezing      calcium carbonate-vitamin D 600 mg(1,561m) -400 unit tablet TAKE 1 TABLET BY MOUTH EVERY DAY      PREMARIN vaginal cream PLACE 0.5 G VAGINALLY NIGHTLY AT BEDTIME      balsalazide (COLAZAL) 750 mg capsule Take 1 capsule (750 mg total) by mouth 3 (three) times daily (Patient not taking: Reported on 05/20/2019  ) 90 capsule 2    budesonide (UCERIS) 9 mg 24 hr tablet Take 1 tablet (9 mg total) by mouth daily 30 tablet 1    esomeprazole (NEXIUM) 40 mg capsule TAKE 1 CAPSULE BY MOUTH EVERY DAY      FLUoxetine (PROZAC) 20 mg capsule       hydrOXYzine (ATARAX) 25 mg tablet       lamoTRIgine (LAMICTAL) 100 mg tablet       ondansetron (ZOFRAN) 8 mg tablet Take 8 mg po every 6-8 hours as needed for nausea related to colonoscopy prep.   To be taken as needed for nausea ( see colonoscopy prep instructions for details) (Patient not taking: Reported on 05/20/2019  ) 3 tablet 0     No current facility-administered medications for this visit.        There are no discontinued  medications.      Social History     Socioeconomic History    Marital status: Widowed     Spouse name: Not on file    Number of children: Not on file    Years of education: Not on file    Highest education level: Not on file   Tobacco Use    Smoking status: Current Every Day Smoker     Years: 8.00    Smokeless tobacco: Never Used    Tobacco comment: 3 cigarettes/day   Substance and Sexual  Activity    Alcohol use: Yes     Comment: holidays only    Drug use: Not Currently     Comment: cocaine 7 years ago    Sexual activity: Not on file   Other Topics Concern    Not on file   Social History Narrative    Not on file       No family history on file.    ROS    There were no vitals filed for this visit.       Physical Exam     STUDIES:    No results found for: NA, CL, KSB2, CREAT, BUN, GLUF, CA, TRIG2, APTH3, APTH4, APTH6, APTH7, APTH8, PWAPF, AG2, FETC, AG13, HCTM, T412, AG1, RABE, AG4, AG14, ALKP, AST, TBILIEXL, ALB, RBC, MCH, MCHC, WBC, HGB, HCT, HCV, PLT, CHOL, HDL, CHOLRATIO, TG  No results found.   Results for orders placed or performed during the hospital encounter of 01/28/19   Endoscopy, colon, diagnostic    Impression    COLONOSCOPY PROCEDURE REPORT     EXAM DATE: 01/28/2019    PATIENT NAME:      Chelsea Ball, Chelsea Ball           MR #:      [id number]  BIRTHDATE:       [birth year]  ATTENDING:     Belenda Cruise M.D.  ASSISTANT:    INDICATIONS:  The patient is a 59 yr old female here for a colonoscopy  due to Patient's prior colonoscopy was unable to be completed past  the distal transverse colon; diverticulosis reported.  Abdominal pain  with eating, weight loss.  PROCEDURE PERFORMED:     Colonoscopy, diagnostic and Colonoscopy with  snare  MEDICATIONS:     Nasal oxygen,   MAC    DESCRIPTION OF PROCEDURE:  During intra-op preparation period all  mechanical & medical equipment was checked for proper function. Hand  hygiene and appropriate measures for infection prevention was taken.  A physicial examination was  performed.  After the risks, benefits and  alternatives of the procedure and sedation were thoroughly explained,  informed consent was verified, confirmed and timeout was successfully  executed by the treatment team.  The [id number]-[id number] ([id number]) endoscope  was introduced through the anus and advanced to the cecum, which was  identified by both the appendiceal orifice and ileocecal valve.  The  scope was then completely withdrawn from the patient and the  procedure terminated.    QUALITY OF PREP:  Adequate for the indication and for the standard  follow up guidelines The [place] Bowel Prep Score was [facility] Scale  Right colon 3, Transverse colon 3, and Left colon 3.  Total BBPS = 9.      A digital rectal exam was performed and revealed no abnormalities of  the rectum.  COLON FINDINGS: Two sessile polyps were found in the ascending colon  and at the cecum.  The polyp sizes were 2 mm.  A polypectomy was  performed with cold forceps.  The resection was complete and the  polyp tissue was completely retrieved.   A 4 mm sessile polyp was  found in the sigmoid colon.  A polypectomy was performed using a cold  snare.  The resection was complete and the polyp tissue was  completely retrieved.   Two sessile polyps were found in the rectum.  The polyp sizes were 4 mm.  A polypectomy was performed with a cold  snare.  The resection was complete and the polyp  tissue was  completely retrieved.   There was severe diverticulosis in the  sigmoid colon, ranging from 18cm to 30cm from the anal verge  (measured on withdrawal with the colon straight and short).  There  were a few areas of patchy erythema in the mucosa between the  diverticula, most suggestive of Segmental Colitis Associtaed with  Diverticulosis (SCAD).  This was biopsied for histology.  Retroflexed views revealed no abnormalities.  Otherwise the  examination was normal.        ESTIMATED BLOOD LOSS:     None  COMPLICATIONS:      There were no complications.  IMPRESSIONS:     1.   Two sessile polyps were found in the ascending  colon and at the cecum; polypectomy was performed with cold forceps  2.  4 mm sessile polyp was found in the sigmoid colon; polypectomy was  performed using a cold snare  3.  Two sessile polyps were found in the rectum; polypectomy was  performed with a cold snare  4.  There was severe diverticulosis in the sigmoid colon, ranging from  18cm to 30cm from the anal verge (measured on withdrawal with the  colon straight and short).  There were a few areas of patchy erythema  in the mucosa between the diverticula, most suggestive of Segmental  Colitis Associtaed with Diverticulosis (SCAD).  This was biopsied for  histology  5.  Retroflexed views revealed no abnormalities    RECOMMENDATIONS:     Await pathology results.  Repeat colonoscopy likely in 3 years if 3 or more of the polyps  removed are tubular adenomas.  I will contact the patient for discussion of biopsy results and  possible segental colitis associated with diverticulosis.  REPEAT COLONOSCOPY:     for Colonoscopy, pending biopsy results.      The attending physician performed the entire procedure.    _____________________________  Belenda Cruise M.D.  Activated:  01/28/2019 1:55 PM      cc:            Reviewed by:     Belenda Cruise M.D.        PATIENT NAME:  Chelsea Ball, Chelsea Ball  MR#: [id number]              Recent Surgical Pathology  (Last result in the past 365 days)               01/28/19 1330  Surgical Pathology Specimen Source (enter 1 per line): 1.   Right colon Polyps, 2.   Sigmoid polyp, 3.   Sigmoid biopsies  r/o colitis, 4.   Rectal polyps Final result    Impression:  [facility]   [address] [place] [address]   TEL: [phone]  FAX: [phone]           SURGICAL PATHOLOGY REPORT               Patient Name: Chelsea Ball, Chelsea Ball   Med. Rec.#: [id number]   DOB: Apr 04, 1961 (Age: 44)   Sex: Female   Accession #: [id number]   Visit #: [id number]    Service Date: 01/28/2019   Received: 01/28/2019   Location: [facility]   Client:[facility]    Physician(s): Shaune Pascal. [place] [phone])                   FINAL PATHOLOGIC DIAGNOSIS       A. Right colon, polyps,  biopsy: Tubular adenoma.            B. Sigmoid polyp, biopsy: Sessile serrated adenoma.       C. Sigmoid, biopsy: Mucosal prolapse.        D. Rectal polyps, biopsy: Sessile serrated adenoma, fragments.               COMMENT:   The trichrome stain on block C1 confirm focal lamina propria fibrosis;   however, no activity is seen. Level sections on blocks B1-D1 confirm the   diagnoses.                    Specimen(s) Received   A:Right colon Polyps   B:Sigmoid polyp   C:Sigmoid biopsies  r/o colitis   D:Rectal polyps           Clinical History   Relevant History: 59 year old woman with diverticulosis.            Gross Description   The specimen is received in 4 parts; each part is labeled with the   patient's name and medical record number.       Part A is received in formalin and additionally labeled "right colon   polyps," and consists of two tan, yellow, irregular soft tissue   fragments (0.2 x 0.2 x 0.2 and 0.2 x 0.2 x 0.2 cm).  The specimen is   entirely submitted in cassette A1.  (rm)       Part B is received in formalin and additionally labeled "sigmoid polyp,"   and consists of a pale pink-red, irregular soft tissue fragment (1.1 x   0.3 x 0.2 cm).  The specimen is entirely submitted in cassette B1.  (rm)       Part C is received in formalin and additionally labeled "sigmoid   biopsies, r/o colitis," and consists of multiple pink, yellow, irregular   soft tissue fragments (0.6 x 0.4 x 0.1 cm in aggregate).  The specimen   is entirely submitted in cassette C1.  (rm)       Part D is received in formalin and additionally labeled "rectal polyps,"   and consists of two yellow, tan, irregular soft tissue fragment (0.6 x   0.4 x 0.2 cm).  The specimen is entirely submitted in cassette D1.  (rm)           The  diagnosis was rendered using whole slide digital images of the glass   slides on [employer]' FDA-approved (21 CFR 924.2683) PIPS-based platform   validated at [employer].               Diagnosis based on microscopic and/or gross examinations.  Final   Diagnosis made by attending pathologist following review of all   pathology slides (histochemical quality is satisfactory).  The attending   pathologist has reviewed all dictations, including prosector work, and   preliminary interpretations performed by any resident involved in the   case and performed all necessary edits before signing the final report.           Laurel Dimmer Kim/Pathologist        Electronically signed out on 01/31/2019 14:34                            ASSESSMENT AND PLAN:  This is a new diagnosis for me.  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  I discussed  laparoscopic possible robot sigmoid colectomy in detail with her as well as the risks, benefits and alternatives.  Risks include but are not limited to bleeding, infection, injury to the surrounding structures, anastomotic leak, hernia, ileus, bowel obstruction, cardiopulmonary and venous thrombotic complication.  We discussed expected postoperative course.  I answered all of her questions.  I advised her to stop smoking and nutrition consult.  She is eager to proceed with surgery.    discussed the risks of surgery.   discussed that surgery may or may not help with her symptoms   encouraged smoking cessation prior to surgery   referral to nutrition to help with weight gain and non-dairy nutrition in preparation for surgery   I will plan to discuss with her GI any potential for additional medical therapy    I spent a total of 30 minutes face-to-face with the patient and 15 minutes of that time was spent counseling regarding treatment plan    I performed this evaluation using real-time telehealth tools, including a live video Zoom connection between my location  and the patient's location. Prior to initiating, I obtained informed verbal consent to perform this evaluation using telehealth tools and answered all the questions about the telehealth interaction.

## 2019-06-05 NOTE — Telephone Encounter (Signed)
Specialty Pharmacy   Prior Authorization Note    XA:[id number]    resubmitted PA  denied  Pharmacy:[facility]   Insurance info: [facility]   Submitted on 06/05/19 via covermymeds   Chelsea Ball (Key: [id number])          Chaney Malling,  Specialty Pharmacy Technician  [phone]

## 2019-06-09 NOTE — Telephone Encounter (Signed)
Specialty Pharmacy   Prior Authorization Note    [id number]    PA: resubmitted 06/09/19 attached letter   Pharmacy:[facility]   Insurance info: [employer]   Submitted on 06/09/19 via covermymeds     (Key: [id number])      Chaney Malling,  Specialty Pharmacy Technician  Pharmacy:[phone]

## 2019-06-10 NOTE — Telephone Encounter (Signed)
Specialty Pharmacy   Prior Authorization Note    YS:[id number]    PA: approved 09/08/19    Pharmacy:[facility]   Insurance info: [employer]   Submitted on 06/09/19 via covermymeds     (Key: [id number])      Chaney Malling,  Specialty Pharmacy Technician  [phone]

## 2019-06-12 MED ORDER — NEOMYCIN 500 MG TABLET
500 | ORAL_TABLET | ORAL | 0 refills | 2.00000 days | Status: AC
Start: 2019-06-12 — End: 2021-03-30

## 2019-06-12 MED ORDER — PEG3350 100 GRAM-SOD SULF 7.5 GRAM-NACL-KCL-ASCORBATE-C ORAL PWDR PACK
PACK | Freq: Once | ORAL | 0 refills | Status: AC
Start: 2019-06-12 — End: 2019-06-12

## 2019-06-12 MED ORDER — METRONIDAZOLE 500 MG TABLET
500 | ORAL_TABLET | ORAL | 0 refills | 7.00000 days | Status: AC
Start: 2019-06-12 — End: 2021-03-30

## 2019-06-12 MED ORDER — ONDANSETRON HCL 4 MG TABLET
4 mg | ORAL_TABLET | Freq: Three times a day (TID) | ORAL | 0 refills | Status: AC | PRN
Start: 2019-06-12 — End: 2021-03-30

## 2019-06-12 NOTE — Progress Notes (Signed)
Surgery orders placed.   Bowel prep sent

## 2019-06-16 NOTE — Progress Notes (Signed)
[facility]  Pharmacist Medication Management      Spoke with Patient who agreed to a comprehensive medication review.    Patient allergies reviewed:  Yes  a. Any onset of new allergies? None noted    Medication recall process reviewed with patient and verbalized understanding?  Yes    Does the patient have an emergency contact person?  Yes: reviewed in epic    Does the patient have any dietary restrictions? i.e. vegan, gluten-free, etc.  None noted    Does the patient have any functional limitations? i.e. hearing impaired, mobility constraints, etc.  None noted    Does the patient have any concerns in affording their medication?  None noted    Does patient have adequate living arrangements to adhere to therapy? i.e. homeless, no refrigerator, etc.  Yes    Is patient educated on emergency preparedness? Information is in our Copy and pharmacy website under resources  Yes    For re-assessments only: Has patient missed any doses in the last 6 months?  new start    Patient's Goal of Therapy:  Patient agrees to goals of therapy: reduce GI discomfort/symptoms; achieve disease remission      [facility]  Pharmacist Medication Reconciliation    Medication Discrepancies and Points of Clarification    Medications ON the [facility] list that the patient is NOT taking:  None noted    Medications NOT on the [facility] list that the patient IS taking:  None noted    Medications ON the [facility] list that the patient is taking DIFFERENTLY than ordered:  pt reports holding all meds for now except calcium/vitD and premarin    Drug-Drug interactions that were identified:  None noted      A/P  1. Start budesonide 9mg  po qam. Counseled pt by phone. Delivery address verified, signature services requested.       Estimated total time spent: 15 to 20 minutes    [place]  [phone]

## 2019-06-18 NOTE — Telephone Encounter (Signed)
I spoke at length with the patient about surgery, steroid medication and nutrition.   We wanted Chelsea Ball to know she received the new steroid medication today 06/18/19.     We scheduled for a nutritionist visit with Greta for next week, details send via e-mail and mychart info to activate.     Pending:  If Dr. Ishmael Holter spoke with the GI MD about treatment options.  The patient is still up in the air about surgery, wants to see how steroids do for next 30 days.

## 2019-06-19 NOTE — Telephone Encounter (Addendum)
Call placed but went to VM. Left message and will try again tomorrow.

## 2019-06-20 NOTE — Telephone Encounter (Signed)
Call placed to ask patient if she has started budesonide which was actually ordered two months ago by her Dr [name] but did not get approved until now.     Reached patient. She would like to try the medication first and started it this morning.   She will give it two weeks and would like a follow up visit with Dr Ishmael Holter via zoom set up to check in again in two weeks. If not benefit, would like to talk about surgery again.     She is aware of the 06/26/19 nutritionist appointment.

## 2019-07-10 ENCOUNTER — Ambulatory Visit: Admit: 2019-07-11 | Discharge: 2019-07-17 | Payer: MEDICAID | Attending: Physician | Primary: Physician

## 2019-07-10 DIAGNOSIS — K5730 Diverticulosis of large intestine without perforation or abscess without bleeding: Secondary | ICD-10-CM

## 2019-07-10 DIAGNOSIS — K5010 Crohn's disease of large intestine without complications: Secondary | ICD-10-CM

## 2019-07-10 MED ORDER — BUDESONIDE DR-ER 9 MG TABLET,DELAYED AND EXTENDED RELEASE
9 | ORAL_TABLET | Freq: Every day | ORAL | 1 refills | Status: DC
Start: 2019-07-10 — End: 2019-08-18

## 2019-07-10 NOTE — Progress Notes (Signed)
English Chelsea Ball presents today at the [facility] for follow-up.     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     At her initial visit she had reported a greater than one year long history of abdominal pain, bowel urgency and increased bowel frequency that was interfering with her quality of life and ability to leave her home. She had reported daily abdominal pain that was debilitating. She has a h/o incomplete colonoscopy in April 2020 (to transverse colon; unsure why incomplete) that demonstrated diverticulosis of the large intestine, which was repeated with anesthesia on 01/28/19 and found severe diverticulosis with endoscopic appearance of SCAD but with pathology showing mucosal prolapse. At her consultation, surgery was discussed but with concerns that it may not relieve all her presenting symptoms. After consultation she was alerted by her pharmacy that a prescription of budesonide, sent in by her GI team but unable to be authorized for an extended period of time, was finally ready for pick up. Patient opted to try this medication first and then follow up in two weeks before moving forward with surgery (laparoscopic possible robot sigmoid colectomy). This decision was made in conjunction with her GI Dr Chalmers Cater. She presents today for follow up to discuss surgery intent s/p two weeks on budesonide.   She reports the budesonide has been very helpful. She states her abdominal pain is much improved as well as her bowel urgency. Her energy is also much improved. She has been able to leave her home now and function. She on today's Zoom video visit is outside the home running errands. She reports increased hunger on the medication and that she has to at time actively  limit her food consumption as over indulgence leads to bloating. She reports this bloating is improved with passage of stool. She reports some intermittent sensation of "clamminess" to her abdomen. She would like to hold off on surgery for now given her improvements and continue on the medication. She reports on week of medication remaining and is unsure if she has a refill but will like one available.         ROS    There were no vitals filed for this visit.       Physical Exam  NAD, woman of stated age, answering appropriately and in good spirits.     STUDIES: ( The following reports and imaging studies have been personally reviewed and interpreted by me )     No results found for: NA, CL, KSB2, CREAT, BUN, GLUF, CA, TRIG2, APTH3, APTH4, APTH6, APTH7, APTH8, PWAPF, AG2, FETC, AG13, HCTM, T412, AG1, RABE, AG4, AG14, ALKP, AST, TBILIEXL, ALB, RBC, MCH, MCHC, WBC, HGB, HCT, HCV, PLT, CHOL, HDL, CHOLRATIO, TG    No results found.   Results for orders placed or performed during the hospital encounter of 01/28/19   Endoscopy, colon, diagnostic    Impression    COLONOSCOPY PROCEDURE REPORT     EXAM DATE: 01/28/2019    PATIENT NAME:      Chelsea Ball, Chelsea Ball           MR #:      [id number]  BIRTHDATE:       05-17-1960  ATTENDING:     Belenda Cruise M.D.  ASSISTANT:    INDICATIONS:  The  patient is a 59 yr old female here for a colonoscopy  due to Patient's prior colonoscopy was unable to be completed past  the distal transverse colon; diverticulosis reported.  Abdominal pain  with eating, weight loss.  PROCEDURE PERFORMED:     Colonoscopy, diagnostic and Colonoscopy with  snare  MEDICATIONS:     Nasal oxygen,   MAC    DESCRIPTION OF PROCEDURE:  During intra-op preparation period all  mechanical & medical equipment was checked for proper function. Hand  hygiene and appropriate measures for infection prevention was taken.  A physicial examination was performed.  After the risks, benefits and  alternatives of the procedure and  sedation were thoroughly explained,  informed consent was verified, confirmed and timeout was successfully  executed by the treatment team.  The EC34-[id number] ([id number]) endoscope  was introduced through the anus and advanced to the cecum, which was  identified by both the appendiceal orifice and ileocecal valve.  The  scope was then completely withdrawn from the patient and the  procedure terminated.    QUALITY OF PREP:  Adequate for the indication and for the standard  follow up guidelines The [place] Bowel Prep Score was [facility] Scale  Right colon 3, Transverse colon 3, and Left colon 3.  Total BBPS = 9.      A digital rectal exam was performed and revealed no abnormalities of  the rectum.  COLON FINDINGS: Two sessile polyps were found in the ascending colon  and at the cecum.  The polyp sizes were 2 mm.  A polypectomy was  performed with cold forceps.  The resection was complete and the  polyp tissue was completely retrieved.   A 4 mm sessile polyp was  found in the sigmoid colon.  A polypectomy was performed using a cold  snare.  The resection was complete and the polyp tissue was  completely retrieved.   Two sessile polyps were found in the rectum.  The polyp sizes were 4 mm.  A polypectomy was performed with a cold  snare.  The resection was complete and the polyp tissue was  completely retrieved.   There was severe diverticulosis in the  sigmoid colon, ranging from 18cm to 30cm from the anal verge  (measured on withdrawal with the colon straight and short).  There  were a few areas of patchy erythema in the mucosa between the  diverticula, most suggestive of Segmental Colitis Associtaed with  Diverticulosis (SCAD).  This was biopsied for histology.  Retroflexed views revealed no abnormalities.  Otherwise the  examination was normal.        ESTIMATED BLOOD LOSS:     None  COMPLICATIONS:      There were no complications.  IMPRESSIONS:     1.  Two sessile polyps were found in the ascending  colon and at the cecum;  polypectomy was performed with cold forceps  2.  4 mm sessile polyp was found in the sigmoid colon; polypectomy was  performed using a cold snare  3.  Two sessile polyps were found in the rectum; polypectomy was  performed with a cold snare  4.  There was severe diverticulosis in the sigmoid colon, ranging from  18cm to 30cm from the anal verge (measured on withdrawal with the  colon straight and short).  There were a few areas of patchy erythema  in the mucosa between the diverticula, most suggestive of Segmental  Colitis Associtaed with Diverticulosis (SCAD).  This was biopsied for  histology  5.  Retroflexed views revealed no abnormalities    RECOMMENDATIONS:     Await pathology results.  Repeat colonoscopy likely in 3 years if 3 or more of the polyps  removed are tubular adenomas.  I will contact the patient for discussion of biopsy results and  possible segental colitis associated with diverticulosis.  REPEAT COLONOSCOPY:     for Colonoscopy, pending biopsy results.      The attending physician performed the entire procedure.    _____________________________  Belenda Cruise M.D.  Activated:  01/28/2019 1:55 PM      cc:            Reviewed by:     Belenda Cruise M.D.        PATIENT NAME:  Chelsea Ball, Chelsea Ball  MR#: [id number]              Recent Surgical Pathology  (Last result in the past 365 days)               01/28/19 1330  Surgical Pathology Specimen Source (enter 1 per line): 1.   Right colon Polyps, 2.   Sigmoid polyp, 3.   Sigmoid biopsies  r/o colitis, 4.   Rectal polyps Final result    Impression:  [facility]   [address] [place] [address]   TEL: [phone]  FAX: [phone]           SURGICAL PATHOLOGY REPORT               Patient Name: Chelsea Ball, Chelsea Ball   Med. Rec.#: [id number]   DOB: [birth year] (Age: 68)   Sex: Female   Accession #: [id number]   Visit #: [id number]   Service Date: 01/28/2019   Received: 01/28/2019   Location: [facility]    Client:[facility]    Physician(s): Shaune Pascal. Selvig [phone])                   FINAL PATHOLOGIC DIAGNOSIS       A. Right colon, polyps, biopsy: Tubular adenoma.            B. Sigmoid polyp, biopsy: Sessile serrated adenoma.       C. Sigmoid, biopsy: Mucosal prolapse.        D. Rectal polyps, biopsy: Sessile serrated adenoma, fragments.               COMMENT:   The trichrome stain on block C1 confirm focal lamina propria fibrosis;   however, no activity is seen. Level sections on blocks B1-D1 confirm the   diagnoses.                    Specimen(s) Received   A:Right colon Polyps   B:Sigmoid polyp   C:Sigmoid biopsies  r/o colitis   D:Rectal polyps           Clinical History   Relevant History: 59 year old woman with diverticulosis.            Gross Description   The specimen is received in 4 parts; each part is labeled with the   patient's name and medical record number.       Part A is received in formalin and additionally labeled "right colon   polyps," and consists of two tan, yellow, irregular soft tissue   fragments (0.2 x 0.2 x 0.2 and 0.2 x 0.2 x 0.2 cm).  The specimen is  entirely submitted in cassette A1.  (rm)       Part B is received in formalin and additionally labeled "sigmoid polyp,"   and consists of a pale pink-red, irregular soft tissue fragment (1.1 x   0.3 x 0.2 cm).  The specimen is entirely submitted in cassette B1.  (rm)       Part C is received in formalin and additionally labeled "sigmoid   biopsies, r/o colitis," and consists of multiple pink, yellow, irregular   soft tissue fragments (0.6 x 0.4 x 0.1 cm in aggregate).  The specimen   is entirely submitted in cassette C1.  (rm)       Part D is received in formalin and additionally labeled "rectal polyps,"   and consists of two yellow, tan, irregular soft tissue fragment (0.6 x   0.4 x 0.2 cm).  The specimen is entirely submitted in cassette D1.  (rm)           The diagnosis was rendered using whole slide digital images of the glass    slides on [employer]' FDA-approved (21 CFR 353.6144) PIPS-based platform   validated at [employer].               Diagnosis based on microscopic and/or gross examinations.  Final   Diagnosis made by attending pathologist following review of all   pathology slides (histochemical quality is satisfactory).  The attending   pathologist has reviewed all dictations, including prosector work, and   preliminary interpretations performed by any resident involved in the   case and performed all necessary edits before signing the final report.           Laurel Dimmer Kim/Pathologist        Electronically signed out on 01/31/2019 14:34                            ASSESSMENT AND PLAN:  She is a 59 year old woman with severe symptomatic SCAD       delighted to hear that she is feeling some significant improvements on the budesonide. I feel it is reasonable to hold off on surgery now and continue on the medication with GI. I did invite her to follow up again in the future for repeat surgical discussion should the medication begin to lose efficacy and her symptoms once again worsen. She reports she will.   Dyann Ruddle NP to send Dr Chalmers Cater a mychart message updating him on patient's report of improvements. She should follow up with GI for any medication/refills.   Follow up here as needed.      I performed this evaluation using real-time telehealth tools, including a live video Zoom connection between my location and the patient's location. Prior to initiating, I obtained informed verbal consent to perform this evaluation using telehealth tools and answered all the questions about the telehealth interaction.

## 2019-07-14 NOTE — Telephone Encounter (Addendum)
Informed pt Dr. Miguel Rota and NP's comments below.  Pt was appreciative of the call and will give [place] an update in 3-4 weeks.      ----- Message from Ivory Broad, MD sent at 07/11/2019 11:25 AM PDT -----  Regarding: Inform of budesonide refill  Hi Chistina Roston,     Can you call Ms. Stribling (she does not use MyChart) to inform her that a refill has been prescribed for her Uceris (budesonide)?    Also remind her that if she needs to get in touch with our clinic for refills she can call the [phone] number, she has had trouble reaching [place] for thing like this in the past so I want to make sure she knows how to get ahold of [place].     -Leotis Shames, NP  Ivory Broad, MD; Doreen Beam, RN            She doesn't have mychart.     Kemp Gomes, would you please let patient know we sent in more oral budesonide and she should give [place] an update in 3-4 weeks.   Josie               ----- Message -----   From: Ivory Broad, MD   Sent: 07/10/2019  4:11 PM PDT   To: Canary Brim, MD, *     Thank you very much, yes I agree if there is improvement we will continue it and I will set up follow up with her, thanks!   -Percell Boston   ----- Message -----   From: Ricci Barker, NP   Sent: 07/10/2019  3:49 PM PDT   To: Canary Brim, MD, Ivory Broad, MD, *     Hi Dr [name] and Byrd Hesselbach.   Dr Ishmael Holter saw Rosey Bath for follow up via video today, and she reports she has had noticeable improvements in pain and energy with the budesonide and so she would like to continue on that and hold off on a surgery for now.   She was asking for a refill of her medication and so I told her that we would reach out to your office with regards to that and setting up any ongoing follow ups with her.   She was advised to return for future surgical discussion should her pain and symptoms stop responding to the medication.   Sincerely   Marjean Donna

## 2019-08-01 MED ORDER — POLYETHYLENE GLYCOL 3350 17 GRAM/DOSE ORAL POWDER
17 | Freq: Every day | ORAL | 3 refills | Status: DC
Start: 2019-08-01 — End: 2020-01-27

## 2019-08-01 NOTE — Telephone Encounter (Signed)
Gastroenterology Clinic Telephone Contact Note    Called Ms Bickley to inform of miralax rx for constipation.     Percell Boston MD  Assistant Professor  Gastroenterology

## 2019-08-19 MED ORDER — BUDESONIDE DR-ER 9 MG TABLET,DELAYED AND EXTENDED RELEASE
9 | ORAL_TABLET | Freq: Every day | ORAL | 1 refills | Status: DC
Start: 2019-08-19 — End: 2019-09-03
  Filled 2019-08-21: qty 30, 30d supply, fill #0

## 2019-08-21 NOTE — Telephone Encounter (Addendum)
Specialty Pharmacy   Prior Authorization Note    Rx: Budesonide ER 9mg  tab  PA: Approved 08/20/19-10/20/19 (2 month supply)  Pharmacy: [facility]  Insurance: [facility]  Submitted on 08/20/19 via CMM (Key: [id number])         10/20/19  [facility]

## 2019-08-29 ENCOUNTER — Ambulatory Visit
Admit: 2019-09-03 | Discharge: 2019-09-04 | Payer: MEDICAID | Attending: Student in an Organized Health Care Education/Training Program | Primary: Physician

## 2019-08-29 DIAGNOSIS — R634 Abnormal weight loss: Secondary | ICD-10-CM

## 2019-08-29 DIAGNOSIS — R109 Unspecified abdominal pain: Secondary | ICD-10-CM

## 2019-08-29 DIAGNOSIS — K5730 Diverticulosis of large intestine without perforation or abscess without bleeding: Secondary | ICD-10-CM

## 2019-08-29 NOTE — Patient Instructions (Signed)
Dear Chelsea Ball,     Please continue taking the miralax and the budesonide.     We will have a repeat telephone visit in 3 months. If you are doing well we can consider trying to decrease the dose of budesonide down to one pill every other day.

## 2019-08-29 NOTE — Progress Notes (Signed)
Chelsea Ball is a 59 y.o. female seen via telehealth for a follow up visit  with chief complaint of No chief complaint on file.   at the request of Derrill Center, DO.    The patient requested this visit. I obtained consent from the patient to conduct this visit by telephone only. I spent a total of 15 minutes in audio communication with this patient.      History of Present Illness  This is a 59 y.o. female with history of irritable bowel syndrome with constipation, diverticulitis, cholecystectomy (2019), depression, substance use and hysterectomy who is referred to gastroenterology for diverticulosis with abdominal pain and incomplete colonoscopy, following up after colonoscopy was able to be completed.     Background history:  Referred from Dr. Casimiro Needle in [place]. Consult question: "Patient with diverticulosis / diverticulitis (treated)... disorted lumen in splenic flexure and descending colon- Please evaluate for possible surgery?"    The patient describes her problem as having problems having a bowel movement. When she eats she has to eat some fruit to have a bowel movement and it can be runny. The patient reports weight loss because she gets severe stomach pain when she eats. The pain is happening on a daily basis. The entire stomach is painful, not one particular area- she feels she has to pant (breathe heavily) which can calm the pain a little bit. She reports that she has been put on bed rest by Dr. Terance Hart because of this. She has had weight loss related to eating less, she was 142 pounds, now down to 115 pounds. The only thing she can eat is soft foods, and reports she's not able to eat much. If she eats more she gets severe pain. She has some nausea but no vomiting. She reports being very weak now. She is also having rotator cuff problem. She reports having bowel movements that are runny - they used to be small, solid, curved, but now have transitioned to being runny. Some days she does not  have a bowel movement, and some days she will have a few runny bowel movements. She has some fear of eating. There is no blood in the stools.     She has not seen a nutritionist, does not take any nutrition shakes. She has stopped taking any of her medications because of the abdominal pain, uses Norco maybe once a week for rotator cuff, occasionally takes Calcium.    The patient reads me her medication list, although she is not curently taking these: Mirtazapine 15, megestrol 15, 83m hydroxyzine, 668mtizanadine PRN, calcium +vitamin D, docusate, fluoxetine, lamotrigine, albuterol, QVAR, Norco 5. She takes esomeprazole 4084ms needed for heartburn. She has tried FiberChoice tablets, didn't help her. Prior medicines from Dr. PetTerance Hartve her diarrhea. She tried miralax. She takes lactulose sometimes when constipated, helps her go. No enemas. Her prior medication list from June 2020 note of Dr. PetTerance Hartso mentions several PPI's, as well as metamucil, Creon, Amitiza, Bentyl, despiramine, Donnatal, fleet's enemas, but she reports prior medicines prescribed by Dr. PetTerance Hartst gave her diarrhea so she's not on them now.     She was seen in June 2020 by her gastroenterologist Dr. PetTerance Harto noted that he had attempted colonoscopy but was unable to advance past an area.     Her cholecystectomy did not help her pain in 2019.     Note, utox has been positive for cocaine metabolites on 05/29/18, 12/25/16, ad 01/25/16.     02/28/19 phone  visit:  At my initial clinic visit on 01/21/19, I planned to repeat colonoscopy with anesthesia. 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 I started the patient on mesalamine 2.4g daily.     At first she was having small bowel movements with postprandial abdominal pain. She is still having thin stools and postprandial discomfort that  begins even right when she begins to eat a meal that has caused some fear of eating larger meals. At our 02/28/19 visit she is a little better but not much. The mesalamine has given her sour stomach after taking it for a week so she has stopped it. Now she reports walking more after being off bed rest and is having still thin bowel movements. She reports still not eating very well but thinks she has not lost further weight. She has not filled other medications with her PCP at this point. She is back on her psych meds. She takes opioids but only rarely, not every day.     05/21/19 visit:  At our 04/29/18 video visit, she was given a course of rifaximin with some improvement initially but symptoms returned. She was prescribed a course of Uceris but was not able to get this due to insurance for a couple of months.     At our 05/21/19 visit, she reports symptoms are getting worse. She has frequent small volume diarrhea, sometimes with urges to have a bowel movement but nothing comes out. When she does have bowel movments, it tends to be small bits or runny. She has had some episodes of incontinence. Some days she goes to the bathroom every 20 or 30 minutes and bowel movements are still very thin- bowel movements are like very long worms if it's not watery. Her abdominal pain is severe- reports it is worse than labor pains, intermittent spasms affecting the whole abdomen - she has to lay still and focus on her breathing to get the pain improve. It involves the whole abdomen and tends to happen when she is on the toilet trying to have a bowel movement. She continues to be afraid to eat because when she eats she usually has to go to the bathroom within 10-15 minutes for a bowel movement and then she gest the pain while on the toilet. She changes her body position (leaning backwards) sometimes helps a little stool come out. She continues to lose weight, she thinks now down to maybe 100 pounds although does not have a scale to  confirm this.     Interval Events- 08/29/19 visit:   At our 05/21/19 visit I had referred the patient to colorectal surgery given the ongoing weight loss and severity of her symptoms. She then had the Uceris approved and started it June 20, 2019 and did experience some improvement in symptoms so surgery was not pursued at the time. She reported constipation and was prescribed Miralax in April 2021.     She is continuing to use Miralax with sometimes loose stools. Appetite is increased on the steroids. She feels a lot better on the Uceris, not miserable and waking up overnight anymore. She still has some episodes of wanting to poop and not being able to poop. She's eating moderately. She has 3 more weeks of Uceris left right now. Her weight has gone up, 113# up to 130.       Past Medical History:  Diagnosis Date    Abdominal pain 01/21/2019    Bipolar 1 disorder (CMS code) 01/21/2019    Depression 01/21/2019    GERD (gastroesophageal reflux disease) 05/04/2016      Past Surgical History:   Procedure Laterality Date    CHOLECYSTECTOMY      HYSTERECTOMY        Social History     Tobacco Use    Smoking status: Current Every Day Smoker     Years: 8.00    Smokeless tobacco: Never Used    Tobacco comment: 3 cigarettes/day   Substance and Sexual Activity    Alcohol use: Yes     Comment: holidays only    Drug use: Not Currently     Comment: cocaine 7 years ago     Family history:  Father and grandmother with colon cancer, also uncles with colon cancer. Father was in his 30's when he got colon cancer.     Medications:   See HPI    Allergies/Contraindications  No Known Allergies     Social:  3 cigarettes per day  Very rare alcohol  Reports not having used drugs for years      Review of Systems  Gen: No fevers/chills, reports no weight loss since last visit  Cardiovascular: No chest pain, no syncope  All other systems were reviewed and are negative.     Physical Exam:  No physical exam due to video visit    I have  personally reviewed the labs, imaging and other diagnostics below:     Latest [facility]  No results found for: HGB, WBC, MCV, PLT, INR, AST, ALT, TBILI, ALKP, ESR, CRP    Latest Outside Labs  01/04/18 Hgb 13.8, Plt 277, WBC 3.4  Calcium 10.1  Lipase 25  HIV negative  TSH 1.33    05/29/18   Utox positive for cocaine metabolites  Hgb 12.0, Plt 229  TSH 3.34    Latest Imaging and Other Diagnostics  01/28/19 colonoscopy ([facility])  IMPRESSIONS:   1. Two sessile polyps were found in the ascending   colon and at the cecum; polypectomy was performed with cold forceps   2. 4 mm sessile polyp was found in the sigmoid colon; polypectomy was   performed using a cold snare   3. Two sessile polyps were found in the rectum; polypectomy was   performed with a cold snare   4. There was severe diverticulosis in the sigmoid colon, ranging from   18cm to 30cm from the anal verge (measured on withdrawal with the   colon straight and short). 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 This was biopsied for   histology   5. Retroflexed views revealed no abnormalities     FINAL PATHOLOGIC DIAGNOSIS  A. Right colon, polyps, biopsy: Tubular adenoma.       B. Sigmoid polyp, biopsy: Sessile serrated adenoma.  C. Sigmoid, biopsy: Mucosal prolapse.   D. Rectal polyps, biopsy: Sessile serrated adenoma, fragments.       07/23/18 Colonoscopy:  Indication: Abdominal pain (Fentanyl 150 total, Versed 4 total)  Comments: Unable to pass distal transverse colon, diverticulosis noted.     07/05/18 Abdominal ultrasound:   Impression: Fatty infiltration of liver, but without evidence of hepatomegaly, splenomegaly, portal hypertension, focal solid mass lesion, biliary ductal dilatation, ascites nor varices.   S/p cholecystectomy without biliary dilatation or choledocholithiasis  Simple right renal cyst    EGD 08/05/18  Impression: Acute  gastritis without bleeding  Pathology:  Antrum biopsy: Mild reactive gastropathy. No intestinal metplasia, H pylori, or neoplasia.    CT abdomen/pelvis with contrast 01/06/19:   Impression:   S/p cholecystectomy clips without evidence of biliary ductal dilatation or choledocholithiasis seen  S/p hysterectomy findings  Fatty infiltration of liver  Bilateral simple renal cysts, largest of which measures 11 x 16 x 76m in mid lower pole junction region of right kidney    01/04/18 CT abdomen/pelvis with IV contrast: (abdominal pain):  FINDINGS:  Mediastinum: A small hiatal hernia is present.    Liver: A small amount of focal fatty infiltration is seen in the liver adjacent  to the falciform ligament. No suspicious masses are seen within the liver.  Gallbladder and bile ducts: The gallbladder has been removed.  Pancreas: Normal. No ductal dilation.  Spleen: Normal. No splenomegaly.  Adrenals: Normal. No mass.  Kidneys and ureters: The right kidney demonstrates a simple cyst in the lower  pole measuring 1.6 cm in diameter. The left kidney demonstrates a simple cyst  in the lower pole measuring 6 mm in diameter. There is no hydronephrosis. No  renal stones are identified.  Stomach and bowel: Unremarkable. No obstruction. No mucosal thickening.  Appendix: No evidence of appendicitis.  Intraperitoneal space: Unremarkable. No free air. No significant fluid  collection.  Vasculature: Unremarkable. No abdominal aortic aneurysm.  Lymph nodes: Unremarkable. No enlarged lymph nodes.    Bladder: Unremarkable as visualized.  Reproductive: Unremarkable as visualized.  Bones/joints: Unremarkable. No acute fracture.  Soft tissues: Unremarkable.  IMPRESSION:  Small hiatal hernia.  Bilateral renal cysts.  No acute findings.  Colonic diverticulosis.    04/13/17 CT abd/pelvis with contrast:   IMPRESSION:  1. Small benign-appearing bilateral renal cysts.  2. Rectosigmoid diverticulosis.  3. 3 mm pulmonary nodule anterior right lung base.  Recommend  followup as per Fleischner Society criteria.    01/02/14 CT abd/pelvis with IV contrast:   1. Colonic diverticulosis. Apparent mild circumferential wall thickening of    the mid sigmoid colon, which is favored to be artifactual, related to    nondistention. However, cannot fully exclude early acute sigmoid    diverticulitis or focal colitis. Recommend correlation with the patient's    clinical symptoms.   2. Echogenic foci with within the dependent gallbladder likely represent    gallstones. However, recommend followup with gallbladder ultrasound to fully    exclude the less likely possibility of a polypoid gallbladder lesion.   3. Tiny distal esophageal diverticulum.   4. Noncalcified 3 mm right middle lobe pulmonary nodule. Recommend comparison    with any prior CT examinations that are available. If none are available,    recommend followup according to Fleischner criteria.    Colonoscopy 10/21/14: report not available    EGD 12/23/2014: report not available      Assessment and Plan  Problem List:  # Abdominal pain  # Weight loss  # Diverticulosis    This is a 59y.o. female with history of irritable bowel syndrome with constipation, diverticulitis, cholecystectomy (2019), depression, substance use and hysterectomy who is referred to gastroenterology for diverticulosis with abdominal pain.     Her outside gastroenterologist was not able to complete a colonoscopy in 2020 due to the severity of her diverticulosis. [facility] was able to be completed but with significant difficulty showing quite severe diverticulosis with endoscopic appearance  suggestive of SCAD (however biopsies just showed mucosal prolapse, not inflammation).     Overall I do suspect that her severe and worsening abdominal symptoms are due to extreme sigmoid diverticulosis with intermittent partially obstructive symptoms. She was not able to tolerate oral mesalamine, but a  trial of Uceris has led to significant improvement in her abdominal pain, bowel movements and appetite, associated with regain of much of the weight she had previously lost. This suggests that there is a component of inflammation associated with the diverticulosis (diagnosis of SCAD) despite the biopsies not showing any inflammation.     The budesonide has resulted in significant improvement of her symptoms, PO intake and weight loss and has likely spared her from needing a sigmoid colectomy at this point. I recommend continuing this for an additional 3 months and reassessing by telephone visit; if she is doing well we can consider slowly weaning the dose (e.g. one tab every other day).     Summary of Recommendations:   -Continue Uceris 3 more months  -Repeat telephone visit in 3 months; if doing well can try decreasing to every other day dosing.     An after visit summary was provided to the patient.

## 2019-09-04 MED ORDER — BUDESONIDE DR-ER 9 MG TABLET,DELAYED AND EXTENDED RELEASE
9 | ORAL_TABLET | Freq: Every day | ORAL | 1 refills | Status: DC
Start: 2019-09-04 — End: 2019-11-17
  Filled 2019-09-22: qty 30, 30d supply, fill #0

## 2019-09-22 NOTE — Telephone Encounter (Signed)
Called pt to schedule telephone visit with Dr. [name] for August 2021, no answer.    LMOVM to call at [phone] to schedule telephone visit.

## 2019-10-16 NOTE — Telephone Encounter (Addendum)
Specialty Pharmacy   Prior Authorization Note    Rx: Budesonide ER 9mg  tab   PA: Approved 10/15/19-01/13/20   Pharmacy: [facility]  Insurance: [facility]    Submitted on 7/7/21via CMM (Key: 12/16/19)         [id number]  [facility]

## 2019-10-23 MED FILL — BUDESONIDE DR-ER 9 MG TABLET,DELAYED AND EXTENDED RELEASE: 9 mg | ORAL | 30 days supply | Qty: 30 | Fill #1 | Status: CP

## 2019-11-18 MED ORDER — BUDESONIDE DR-ER 9 MG TABLET,DELAYED AND EXTENDED RELEASE
9 | ORAL_TABLET | Freq: Every day | ORAL | 1 refills | Status: DC
Start: 2019-11-18 — End: 2020-01-27

## 2019-12-02 NOTE — Telephone Encounter (Signed)
Called pt to let her know that Dr. [name] has OK'ed telephone visit to be scheduled in a video visit time slot per Dr. [name]. Was not able to leave VM as mailbox was full.

## 2020-01-07 ENCOUNTER — Encounter: Payer: Self-pay | Admitting: Obstetrics and Gynecology

## 2020-01-21 NOTE — Telephone Encounter (Signed)
Tried to schedule pt for a telephone visit however pt was very upset that Dr. Miguel Rota next availability will not be until January.    Pt claims she is in severe pain and is unwilling to wait until January. Pt requested to be seen as soon as this week.    Seeking Dr. Miguel Rota advise in regards to accommodating scheduling pt for a sooner telephone visit.

## 2020-01-21 NOTE — Telephone Encounter (Addendum)
Specialty Pharmacy   Prior Authorization Note    EX:[id number]    PA: reauth pending    Pharmacy:[facility]   Insurance info: [employer]   Submitted on 01/21/20 via covermymeds       Galen Daft (Key: [id number])  Budesonide ER 9MG  er tablets      DS PA PEND, INS REOPENED PA, missing info 10/19       11/19,  Specialty Pharmacy Technician  [phone]

## 2020-01-26 ENCOUNTER — Ambulatory Visit
Admit: 2020-01-30 | Payer: MEDICAID | Attending: Student in an Organized Health Care Education/Training Program | Primary: Physician

## 2020-01-26 DIAGNOSIS — K5010 Crohn's disease of large intestine without complications: Secondary | ICD-10-CM

## 2020-01-26 DIAGNOSIS — K5900 Constipation, unspecified: Secondary | ICD-10-CM

## 2020-01-26 DIAGNOSIS — K5730 Diverticulosis of large intestine without perforation or abscess without bleeding: Secondary | ICD-10-CM

## 2020-01-26 NOTE — Patient Instructions (Addendum)
Dear Ms. Dolce,    It was nice to see you in clinic today! I recommend:     -Take Uceris every other day  -Miralax daily  -I have prescribed a bowel preparation, you can take half of it if you have a significant episode of constipation  -Let's have a follow up telephone visit in 2-3 months.     Sincerely,  Dr. [name]

## 2020-01-26 NOTE — Progress Notes (Signed)
Chelsea Ball is a 59 y.o. female seen via telehealth for a follow up visit  with chief complaint of No chief complaint on file.   at the request of Derrill Center, DO.    The patient requested this visit. I obtained consent from the patient to conduct this visit by telephone only. I spent a total of 16 minutes in audio communication with this patient.      History of Present Illness  This is a 59 y.o. female with history of irritable bowel syndrome with constipation, diverticulitis, cholecystectomy (2019), depression, substance use and hysterectomy who is referred to gastroenterology for diverticulosis with abdominal pain and incomplete colonoscopy, following up after colonoscopy was able to be completed.     Background history:  Referred from Dr. Casimiro Needle in [place]. Consult question: "Patient with diverticulosis / diverticulitis (treated).[name] disorted lumen in splenic flexure and descending colon- Please evaluate for possible surgery?"    The patient describes her problem as having problems having a bowel movement. When she eats she has to eat some fruit to have a bowel movement and it can be runny. The patient reports weight loss because she gets severe stomach pain when she eats. The pain is happening on a daily basis. The entire stomach is painful, not one particular area- she feels she has to pant (breathe heavily) which can calm the pain a little bit. She reports that she has been put on bed rest by Dr. Terance Hart because of this. She has had weight loss related to eating less, she was 142 pounds, now down to 115 pounds. The only thing she can eat is soft foods, and reports she's not able to eat much. If she eats more she gets severe pain. She has some nausea but no vomiting. She reports being very weak now. She is also having rotator cuff problem. She reports having bowel movements that are runny - they used to be small, solid, curved, but now have transitioned to being runny. Some days she does not  have a bowel movement, and some days she will have a few runny bowel movements. She has some fear of eating. There is no blood in the stools.     She has not seen a nutritionist, does not take any nutrition shakes. She has stopped taking any of her medications because of the abdominal pain, uses Norco maybe once a week for rotator cuff, occasionally takes Calcium.    The patient reads me her medication list, although she is not curently taking these: Mirtazapine 15, megestrol 15, 70m hydroxyzine, 667mtizanadine PRN, calcium +vitamin D, docusate, fluoxetine, lamotrigine, albuterol, QVAR, Norco 5. She takes esomeprazole 40106ms needed for heartburn. She has tried FiberChoice tablets, didn't help her. Prior medicines from Dr. PetTerance Hartve her diarrhea. She tried miralax. She takes lactulose sometimes when constipated, helps her go. No enemas. Her prior medication list from June 2020 note of Dr. PetTerance Hartso mentions several PPI's, as well as metamucil, Creon, Amitiza, Bentyl, despiramine, Donnatal, fleet's enemas, but she reports prior medicines prescribed by Dr. PetTerance Hartst gave her diarrhea so she's not on them now.     She was seen in June 2020 by her gastroenterologist Dr. PetTerance Harto noted that he had attempted colonoscopy but was unable to advance past an area.     Her cholecystectomy did not help her pain in 2019.     Note, utox has been positive for cocaine metabolites on 05/29/18, 12/25/16, ad 01/25/16.     02/28/19 phone  visit:  At my initial clinic visit on 01/21/19, I planned to repeat colonoscopy with anesthesia. 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 I started the patient on mesalamine 2.4g daily.     At first she was having small bowel movements with postprandial abdominal pain. She is still having thin stools and postprandial discomfort that  begins even right when she begins to eat a meal that has caused some fear of eating larger meals. At our 02/28/19 visit she is a little better but not much. The mesalamine has given her sour stomach after taking it for a week so she has stopped it. Now she reports walking more after being off bed rest and is having still thin bowel movements. She reports still not eating very well but thinks she has not lost further weight. She has not filled other medications with her PCP at this point. She is back on her psych meds. She takes opioids but only rarely, not every day.     05/21/19 visit:  At our 04/29/18 video visit, she was given a course of rifaximin with some improvement initially but symptoms returned. She was prescribed a course of Uceris but was not able to get this due to insurance for a couple of months.     At our 05/21/19 visit, she reports symptoms are getting worse. She has frequent small volume diarrhea, sometimes with urges to have a bowel movement but nothing comes out. When she does have bowel movments, it tends to be small bits or runny. She has had some episodes of incontinence. Some days she goes to the bathroom every 20 or 30 minutes and bowel movements are still very thin- bowel movements are like very long worms if it's not watery. Her abdominal pain is severe- reports it is worse than labor pains, intermittent spasms affecting the whole abdomen - she has to lay still and focus on her breathing to get the pain improve. It involves the whole abdomen and tends to happen when she is on the toilet trying to have a bowel movement. She continues to be afraid to eat because when she eats she usually has to go to the bathroom within 10-15 minutes for a bowel movement and then she gest the pain while on the toilet. She changes her body position (leaning backwards) sometimes helps a little stool come out. She continues to lose weight, she thinks now down to maybe 100 pounds although does not have a scale to  confirm this.     Interval Events- 08/29/19 visit:   At our 05/21/19 visit I had referred the patient to colorectal surgery given the ongoing weight loss and severity of her symptoms. She then had the Uceris approved and started it June 20, 2019 and did experience some improvement in symptoms so surgery was not pursued at the time. She reported constipation and was prescribed Miralax in April 2021.     She is continuing to use Miralax with sometimes loose stools. Appetite is increased on the steroids. She feels a lot better on the Uceris, not miserable and waking up overnight anymore. She still has some episodes of wanting to poop and not being able to poop. She's eating moderately. She has 3 more weeks of Uceris left right now. Her weight has gone up, 113# up to 130.     Interval Events: 01/26/20  She had  an ED visit 11/05/19 for abdominal pain. Mobic was refilled and gabapentin prescribed.     Lower abd pain, sometimes wrapping around to the back. Pelvic ultrasound was normal. Has been having problems with bowel movements. Has not received budesonide this week. Entire lower abdomen. miralax works only fair. Budesonide was increasing appetite, but eating makes pain worse. Throughout day there is a periodic ache. bm's 1-2 times per day, but has to lean back and forth to have bm's, normally 3-4 small piees in the toilet. Symptoms tend to be relieved with bowel movements. Weight is around 142.     Past Medical History:   Diagnosis Date    Abdominal pain 01/21/2019    Bipolar 1 disorder (CMS code) 01/21/2019    Depression 01/21/2019    GERD (gastroesophageal reflux disease) 05/04/2016      Past Surgical History:   Procedure Laterality Date    CHOLECYSTECTOMY      HYSTERECTOMY        Social History     Tobacco Use    Smoking status: Current Every Day Smoker     Years: 8.00    Smokeless tobacco: Never Used    Tobacco comment: 3 cigarettes/day   Substance and Sexual Activity    Alcohol use: Yes     Comment:  holidays only    Drug use: Not Currently     Comment: cocaine 7 years ago    Sexual activity: Not on file   Social History Narrative    Not on file     Family history:  Father and grandmother with colon cancer, also uncles with colon cancer. Father was in his 89's when he got colon cancer.     Medications:   See HPI    Allergies/Contraindications  No Known Allergies     Social:  3 cigarettes per day  Very rare alcohol  Reports not having used drugs for years      Review of Systems  Gen: No fevers/chills, reports no weight loss since last visit  Cardiovascular: No chest pain, no syncope  All other systems were reviewed and are negative.     Physical Exam:  No physical exam due to video visit    I have personally reviewed the labs, imaging and other diagnostics below:     Latest Meno Labs  No results found for: HGB, WBC, MCV, PLT, INR, AST, ALT, TBILI, ALKP, ESR, CRP    Latest Outside Labs  01/04/18 Hgb 13.8, Plt 277, WBC 3.4  Calcium 10.1  Lipase 25  HIV negative  TSH 1.33    05/29/18   Utox positive for cocaine metabolites  Hgb 12.0, Plt 229  TSH 3.34    Latest Imaging and Other Diagnostics  01/28/19 colonoscopy ([place])  IMPRESSIONS:   1. Two sessile polyps were found in the ascending   colon and at the cecum; polypectomy was performed with cold forceps   2. 4 mm sessile polyp was found in the sigmoid colon; polypectomy was   performed using a cold snare   3. Two sessile polyps were found in the rectum; polypectomy was   performed with a cold snare   4. There was severe diverticulosis in the sigmoid colon, ranging from   18cm to 30cm from the anal verge (measured on withdrawal with the   colon straight and short). 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 This  was biopsied for   histology   5. Retroflexed views revealed no abnormalities     FINAL PATHOLOGIC DIAGNOSIS  A. Right colon,  polyps, biopsy: Tubular adenoma.       B. Sigmoid polyp, biopsy: Sessile serrated adenoma.  C. Sigmoid, biopsy: Mucosal prolapse.   D. Rectal polyps, biopsy: Sessile serrated adenoma, fragments.       07/23/18 Colonoscopy:  Indication: Abdominal pain (Fentanyl 150 total, Versed 4 total)  Comments: Unable to pass distal transverse colon, diverticulosis noted.     07/05/18 Abdominal ultrasound:   Impression: Fatty infiltration of liver, but without evidence of hepatomegaly, splenomegaly, portal hypertension, focal solid mass lesion, biliary ductal dilatation, ascites nor varices.   S/p cholecystectomy without biliary dilatation or choledocholithiasis  Simple right renal cyst    EGD 08/05/18  Impression: Acute gastritis without bleeding  Pathology:  Antrum biopsy: Mild reactive gastropathy. No intestinal metplasia, H pylori, or neoplasia.    CT abdomen/pelvis with contrast 01/06/19:   Impression:   S/p cholecystectomy clips without evidence of biliary ductal dilatation or choledocholithiasis seen  S/p hysterectomy findings  Fatty infiltration of liver  Bilateral simple renal cysts, largest of which measures 11 x 16 x 46m in mid lower pole junction region of right kidney    01/04/18 CT abdomen/pelvis with IV contrast: (abdominal pain):  FINDINGS:  Mediastinum: A small hiatal hernia is present.    Liver: A small amount of focal fatty infiltration is seen in the liver adjacent  to the falciform ligament. No suspicious masses are seen within the liver.  Gallbladder and bile ducts: The gallbladder has been removed.  Pancreas: Normal. No ductal dilation.  Spleen: Normal. No splenomegaly.  Adrenals: Normal. No mass.  Kidneys and ureters: The right kidney demonstrates a simple cyst in the lower  pole measuring 1.6 cm in diameter. The left kidney demonstrates a simple cyst  in the lower pole measuring 6 mm in diameter. There is no hydronephrosis. No  renal stones are identified.  Stomach and bowel: Unremarkable. No obstruction. No  mucosal thickening.  Appendix: No evidence of appendicitis.  Intraperitoneal space: Unremarkable. No free air. No significant fluid  collection.  Vasculature: Unremarkable. No abdominal aortic aneurysm.  Lymph nodes: Unremarkable. No enlarged lymph nodes.    Bladder: Unremarkable as visualized.  Reproductive: Unremarkable as visualized.  Bones/joints: Unremarkable. No acute fracture.  Soft tissues: Unremarkable.  IMPRESSION:  Small hiatal hernia.  Bilateral renal cysts.  No acute findings.  Colonic diverticulosis.    04/13/17 CT abd/pelvis with contrast:   IMPRESSION:  1. Small benign-appearing bilateral renal cysts.  2. Rectosigmoid diverticulosis.  3. 3 mm pulmonary nodule anterior right lung base. Recommend  followup as per Fleischner Society criteria.    01/02/14 CT abd/pelvis with IV contrast:   1. Colonic diverticulosis. Apparent mild circumferential wall thickening of    the mid sigmoid colon, which is favored to be artifactual, related to    nondistention. However, cannot fully exclude early acute sigmoid    diverticulitis or focal colitis. Recommend correlation with the patient's    clinical symptoms.   2. Echogenic foci with within the dependent gallbladder likely represent    gallstones. However, recommend followup with gallbladder ultrasound to fully    exclude the less likely possibility of a polypoid gallbladder lesion.   3. Tiny distal esophageal diverticulum.   4. Noncalcified 3 mm right middle lobe pulmonary nodule. Recommend comparison    with any prior CT examinations that are available. If none are available,  recommend followup according to Fleischner criteria.    Colonoscopy 10/21/14: report not available    EGD 12/23/2014: report not available      Assessment and Plan  Problem List:  # Abdominal pain  # Weight loss  # Diverticulosis    This is a 59 y.o. female with history of irritable bowel syndrome with constipation, diverticulitis, cholecystectomy (2019), depression, substance use  and hysterectomy who is referred to gastroenterology for diverticulosis with abdominal pain.     Her outside gastroenterologist was not able to complete a colonoscopy in 2020 due to the severity of her diverticulosis. Subsequent colonoscopy with anesthesia here at [facility] 01/28/19 was able to be completed but with significant difficulty showing quite severe diverticulosis with endoscopic appearance suggestive of SCAD (however biopsies just showed mucosal prolapse, not inflammation).     Overall I do suspect that her lower abdominal symptoms are due to extreme sigmoid diverticulosis with intermittent partially obstructive symptoms. She was not able to tolerate oral mesalamine, but a trial of Uceris has led to significant improvement in her abdominal pain, bowel movements and appetite, associated with regain of much of the weight she had previously lost. This suggests that there is a component of inflammation associated with the diverticulosis (diagnosis of SCAD) despite the biopsies not showing any inflammation.     The budesonide has resulted in significant improvement of her symptoms, PO intake and weight loss and has likely spared her from needing a sigmoid colectomy at this point. As she has been on this for over 3 months I will have her try gradually titrating to 75m every other day and see her again in 2-3 months. 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     Summary of Recommendations:   -Continue Uceris, take every other day for 3 months  -Continue daily Miralax  -Anorectal manometry referral    -Repeat telephone visit in 2-3 months;

## 2020-01-27 MED ORDER — POLYETHYLENE GLYCOL 3350 17 GRAM/DOSE ORAL POWDER
17 | Freq: Every day | ORAL | 3 refills | Status: AC
Start: 2020-01-27 — End: 2020-09-17

## 2020-01-27 MED ORDER — PEG 3350-ELECTROLYTES 236 GRAM-22.74 GRAM-6.74 GRAM-5.86 GRAM SOLUTION
Freq: Once | ORAL | 0 refills | Status: AC
Start: 2020-01-27 — End: 2020-01-27

## 2020-01-27 MED ORDER — BUDESONIDE DR-ER 9 MG TABLET,DELAYED AND EXTENDED RELEASE
9 | ORAL_TABLET | ORAL | 1 refills | Status: DC
Start: 2020-01-27 — End: 2020-02-27
  Filled 2020-02-04: qty 15, 30d supply, fill #0

## 2020-01-28 NOTE — Telephone Encounter (Signed)
Specialty Pharmacy   Prior Authorization Note    [id number]    PA: reauth pending   Pharmacy:[facility]   Insurance info: [employer]   Submitted on 01/28/20 via covermymeds     Resubmitted with LETTER MEDICAL NECESSITY     Chelsea Ball (Key: [id number])  Budesonide ER 9MG  er tablets      DS PA PEND, INS REOPENED PA, missing info 10/19       11/19,  Specialty Pharmacy Technician  Pharmacy:[phone]

## 2020-01-28 NOTE — Telephone Encounter (Signed)
Specialty Pharmacy   Prior Authorization Note    BP:[id number]    PA: reauth denied    Pharmacy:[facility]   Insurance info: [employer]   Submitted on 01/21/20 via covermymeds       Galen Daft (Key: [id number])  Budesonide ER 9MG  er tablets      DS PA PEND, INS REOPENED PA, missing info 10/19       11/19,  Specialty Pharmacy Technician  [phone]

## 2020-01-28 NOTE — Telephone Encounter (Signed)
Got a transferred call from [employer] ([employer] about PA for pt's budesonide.  Per Rahel, PharmD form [employer]., pt should not be on uceris for longer than 16 weeks and it appears we are prescribing it for her for 7 months now. Per latest office visit  notes, pt is doing much better on this regimen and we are planning to wean her off by  Reducing the dose ( 9 mg every other day). I managed to convince Rahel to approve the current script for one more time but she made a note in pt's file and mentioned that no more PA will be granted. She offered to switch pt to Entocort 3 mg which will not require a PA if pt will still require the medication in the future.

## 2020-01-29 NOTE — Telephone Encounter (Signed)
Specialty Pharmacy   Prior Authorization Note    [id number]  ZE:SPQZRA approved 1x. Pt must speak to md for new assessment plan.   Pharmacy:[facility]  Insurance info:[employer]  Submitted on10/20/21via covermymeds     Resubmitted with LETTER MEDICAL NECESSITY     Chelsea Ball (Key: [id number])  Budesonide ER 9MG  er tablets    DS PA PEND,INSREOPENEDPA, missing info10/19      ,  Specialty Pharmacy Technician  [phone]

## 2020-02-27 MED ORDER — BUDESONIDE DR - ER 3 MG CAPSULE,DELAYED,EXTENDED RELEASE
3 | ORAL_CAPSULE | Freq: Every morning | ORAL | 1 refills | 30.00000 days | Status: DC
Start: 2020-02-27 — End: 2020-04-20

## 2020-02-27 NOTE — Telephone Encounter (Signed)
Patient is no longer using [facility] as of 02/27/20 for the following medication: Budesonide 9mg .    Reason for discharge: Patient completed prescribed therapy, Provider order new Rx to patient local pharmacy for Budesonide 3mg  to wean off.    Summary of pharmacy services provided: Prior authorization completed    Response to therapy: Patient responded well to therapy and achieved goals of therapy    Patient filled the medication last on 02/04/20.    Reviewed and completed by:  

## 2020-02-27 NOTE — Telephone Encounter (Signed)
Informed pt provider's comments below.  She was agreeable to recs and will call the clinic to provide an update after weaning off Entocort in a month.    Received: Today  Ivory Broad, MD  Doreen Beam, RN  Cc: Maryella Shivers, RN; Alycia Patten Sieng  Caller: Unspecified (1 month ago)    Hi Jamilee Lafosse,   I have put in a prescription for 3mg  Entocort to replace the 9mg  Uceris. I gave a one month prescription with one refill. This is part of the process of having her try to wean off these steroid medicines. Can you contact the patient (phone, doesn't use mychart) to tell her we have put in a lower dose medicine to be taken every day for a month, then try going off it entirely. She should call the clinic to provide an update after weaning off it in a month-   Dan

## 2020-02-27 NOTE — Telephone Encounter (Signed)
Rx: Budesonide  PA renewal required.

## 2020-02-27 NOTE — Telephone Encounter (Signed)
Rx: Budesonide  PA renewal required. Will need additional medical justification to submit auth.     Chaney Malling,  Specialty Pharmacy Technician  [phone]

## 2020-03-01 NOTE — Telephone Encounter (Addendum)
Attempted to call pt as I received message from [facility] pt has a red flag.    Unable to reach pt x 2and unable to LM as line went busy x2.

## 2020-03-09 NOTE — Telephone Encounter (Addendum)
03/01/20 Attempted to call pt as I received message from [facility] pt has a red flag.    Unable to reach pt x 2and unable to LM as line went busy x2.

## 2020-03-16 ENCOUNTER — Encounter: Payer: Self-pay | Admitting: Obstetrics and Gynecology

## 2020-03-16 ENCOUNTER — Other Ambulatory Visit: Payer: Self-pay

## 2020-03-16 ENCOUNTER — Other Ambulatory Visit (HOSPITAL_COMMUNITY)
Admission: RE | Admit: 2020-03-16 | Discharge: 2020-03-16 | Disposition: A | Discharge status: Home / Self Care | Payer: BC Managed Care – PPO | Source: Ambulatory Visit | Attending: Obstetrics and Gynecology | Admitting: Obstetrics and Gynecology

## 2020-03-16 ENCOUNTER — Ambulatory Visit (INDEPENDENT_AMBULATORY_CARE_PROVIDER_SITE_OTHER): Payer: BC Managed Care – PPO | Admitting: Obstetrics and Gynecology

## 2020-03-16 VITALS — BP 120/81 | HR 102 | Ht 67.0 in | Wt 180.7 lb

## 2020-03-16 DIAGNOSIS — R3915 Urgency of urination: Secondary | ICD-10-CM

## 2020-03-16 DIAGNOSIS — Z01419 Encounter for gynecological examination (general) (routine) without abnormal findings: Secondary | ICD-10-CM

## 2020-03-16 DIAGNOSIS — Z1231 Encounter for screening mammogram for malignant neoplasm of breast: Secondary | ICD-10-CM | POA: Diagnosis not present

## 2020-03-16 DIAGNOSIS — Z0000 Encounter for general adult medical examination without abnormal findings: Secondary | ICD-10-CM

## 2020-03-16 DIAGNOSIS — Z1211 Encounter for screening for malignant neoplasm of colon: Secondary | ICD-10-CM

## 2020-03-16 DIAGNOSIS — Z124 Encounter for screening for malignant neoplasm of cervix: Secondary | ICD-10-CM | POA: Insufficient documentation

## 2020-03-16 DIAGNOSIS — M8588 Other specified disorders of bone density and structure, other site: Secondary | ICD-10-CM

## 2020-03-16 DIAGNOSIS — Z7689 Persons encountering health services in other specified circumstances: Secondary | ICD-10-CM | POA: Diagnosis not present

## 2020-03-16 LAB — POCT URINALYSIS DIPSTICK
Bilirubin, UA: NEGATIVE
Blood, UA: NEGATIVE
Glucose, UA: NEGATIVE
Ketones, UA: NEGATIVE
Leukocytes, UA: NEGATIVE
Nitrite, UA: NEGATIVE
Protein, UA: NEGATIVE
Spec Grav, UA: >=1.03 — AB (ref 1.010–1.025)
Urobilinogen, UA: 0.2 E.U./dL
pH, UA: 6 (ref 5.0–8.0)

## 2020-03-16 NOTE — Progress Notes (Signed)
Pt present to est care and annual exam. Pt stated that she was doing well. Pt had flu vaccin and covid vaccine this year. Pt stated that she was due for her colonoscopy and mammogram.

## 2020-03-16 NOTE — Patient Instructions (Addendum)
Preventive Care 40-58 Years Old, Female Preventive care refers to visits with your health care provider and lifestyle choices that can promote health and wellness. This includes:  A yearly physical exam. This may also be called an annual well check.  Regular dental visits and eye exams.  Immunizations.  Screening for certain conditions.  Healthy lifestyle choices, such as eating a healthy diet, getting regular exercise, not using drugs or products that contain nicotine and tobacco, and limiting alcohol use. What can I expect for my preventive care visit? Physical exam Your health care provider will check your:  Height and weight. This may be used to calculate body mass index (BMI), which tells if you are at a healthy weight.  Heart rate and blood pressure.  Skin for abnormal spots. Counseling Your health care provider may ask you questions about your:  Alcohol, tobacco, and drug use.  Emotional well-being.  Home and relationship well-being.  Sexual activity.  Eating habits.  Work and work environment.  Method of birth control.  Menstrual cycle.  Pregnancy history. What immunizations do I need?  Influenza (flu) vaccine  This is recommended every year. Tetanus, diphtheria, and pertussis (Tdap) vaccine  You may need a Td booster every 10 years. Varicella (chickenpox) vaccine  You may need this if you have not been vaccinated. Zoster (shingles) vaccine  You may need this after age 60. Measles, mumps, and rubella (MMR) vaccine  You may need at least one dose of MMR if you were born in 1957 or later. You may also need a second dose. Pneumococcal conjugate (PCV13) vaccine  You may need this if you have certain conditions and were not previously vaccinated. Pneumococcal polysaccharide (PPSV23) vaccine  You may need one or two doses if you smoke cigarettes or if you have certain conditions. Meningococcal conjugate (MenACWY) vaccine  You may need this if you  have certain conditions. Hepatitis A vaccine  You may need this if you have certain conditions or if you travel or work in places where you may be exposed to hepatitis A. Hepatitis B vaccine  You may need this if you have certain conditions or if you travel or work in places where you may be exposed to hepatitis B. Haemophilus influenzae type b (Hib) vaccine  You may need this if you have certain conditions. Human papillomavirus (HPV) vaccine  If recommended by your health care provider, you may need three doses over 6 months. You may receive vaccines as individual doses or as more than one vaccine together in one shot (combination vaccines). Talk with your health care provider about the risks and benefits of combination vaccines. What tests do I need? Blood tests  Lipid and cholesterol levels. These may be checked every 5 years, or more frequently if you are over 50 years old.  Hepatitis C test.  Hepatitis B test. Screening  Lung cancer screening. You may have this screening every year starting at age 55 if you have a 30-pack-year history of smoking and currently smoke or have quit within the past 15 years.  Colorectal cancer screening. All adults should have this screening starting at age 50 and continuing until age 75. Your health care provider may recommend screening at age 45 if you are at increased risk. You will have tests every 1-10 years, depending on your results and the type of screening test.  Diabetes screening. This is done by checking your blood sugar (glucose) after you have not eaten for a while (fasting). You may have this   done every 1-3 years.  Mammogram. This may be done every 1-2 years. Talk with your health care provider about when you should start having regular mammograms. This may depend on whether you have a family history of breast cancer.  BRCA-related cancer screening. This may be done if you have a family history of breast, ovarian, tubal, or peritoneal  cancers.  Pelvic exam and Pap test. This may be done every 3 years starting at age 60. Starting at age 7, this may be done every 5 years if you have a Pap test in combination with an HPV test. Other tests  Sexually transmitted disease (STD) testing.  Bone density scan. This is done to screen for osteoporosis. You may have this scan if you are at high risk for osteoporosis. Follow these instructions at home: Eating and drinking  Eat a diet that includes fresh fruits and vegetables, whole grains, lean protein, and low-fat dairy.  Take vitamin and mineral supplements as recommended by your health care provider.  Do not drink alcohol if: ? Your health care provider tells you not to drink. ? You are pregnant, may be pregnant, or are planning to become pregnant.  If you drink alcohol: ? Limit how much you have to 0-1 drink a day. ? Be aware of how much alcohol is in your drink. In the [place] one drink equals one 12 oz bottle of beer (355 mL), one 5 oz glass of wine (148 mL), or one 1 oz glass of hard liquor (44 mL). Lifestyle  Take daily care of your teeth and gums.  Stay active. Exercise for at least 30 minutes on 5 or more days each week.  Do not use any products that contain nicotine or tobacco, such as cigarettes, e-cigarettes, and chewing tobacco. If you need help quitting, ask your health care provider.  If you are sexually active, practice safe sex. Use a condom or other form of birth control (contraception) in order to prevent pregnancy and STIs (sexually transmitted infections).  If told by your health care provider, take low-dose aspirin daily starting at age 48. What's next?  Visit your health care provider once a year for a well check visit.  Ask your health care provider how often you should have your eyes and teeth checked.  Stay up to date on all vaccines. This information is not intended to replace advice given to you by your health care provider. Make sure you  discuss any questions you have with your health care provider. Document Revised: 12/06/2017 Document Reviewed: 12/06/2017 Elsevier Patient Education  2020 [place] Breast self-awareness is knowing how your breasts look and feel. Doing breast self-awareness is important. It allows you to catch a breast problem early while it is still small and can be treated. All women should do breast self-awareness, including women who have had breast implants. Tell your doctor if you notice a change in your breasts. What you need:  A mirror.  A well-lit room. How to do a breast self-exam A breast self-exam is one way to learn what is normal for your breasts and to check for changes. To do a breast self-exam: Look for changes  1. Take off all the clothes above your waist. 2. Stand in front of a mirror in a room with good lighting. 3. Put your hands on your hips. 4. Push your hands down. 5. Look at your breasts and nipples in the mirror to see if one breast or nipple looks different from the  other. Check to see if: ? The shape of one breast is different. ? The size of one breast is different. ? There are wrinkles, dips, and bumps in one breast and not the other. 6. Look at each breast for changes in the skin, such as: ? Redness. ? Scaly areas. 7. Look for changes in your nipples, such as: ? Liquid around the nipples. ? Bleeding. ? Dimpling. ? Redness. ? A change in where the nipples are. Feel for changes  1. Lie on your back on the floor. 2. Feel each breast. To do this, follow these steps: ? Pick a breast to feel. ? Put the arm closest to that breast above your head. ? Use your other arm to feel the nipple area of your breast. Feel the area with the pads of your three middle fingers by making small circles with your fingers. For the first circle, press lightly. For the second circle, press harder. For the third circle, press even harder. ? Keep making circles with  your fingers at the different pressures as you move down your breast. Stop when you feel your ribs. ? Move your fingers a little toward the center of your body. ? Start making circles with your fingers again, this time going up until you reach your collarbone. ? Keep making up-and-down circles until you reach your armpit. Remember to keep using the three pressures. ? Feel the other breast in the same way. 3. Sit or stand in the tub or shower. 4. With soapy water on your skin, feel each breast the same way you did in step 2 when you were lying on the floor. Write down what you find Writing down what you find can help you remember what to tell your doctor. Write down:  What is normal for each breast.  Any changes you find in each breast, including: ? The kind of changes you find. ? Whether you have pain. ? Size and location of any lumps.  When you last had your menstrual period. General tips  Check your breasts every month.  If you are breastfeeding, the best time to check your breasts is after you feed your baby or after you use a breast pump.  If you get menstrual periods, the best time to check your breasts is 5-7 days after your menstrual period is over.  With time, you will become comfortable with the self-exam, and you will begin to know if there are changes in your breasts. Contact a doctor if you:  See a change in the shape or size of your breasts or nipples.  See a change in the skin of your breast or nipples, such as red or scaly skin.  Have fluid coming from your nipples that is not normal.  Find a lump or thick area that was not there before.  Have pain in your breasts.  Have any concerns about your breast health. Summary  Breast self-awareness includes looking for changes in your breasts, as well as feeling for changes within your breasts.  Breast self-awareness should be done in front of a mirror in a well-lit room.  You should check your breasts every month.  If you get menstrual periods, the best time to check your breasts is 5-7 days after your menstrual period is over.  Let your doctor know of any changes you see in your breasts, including changes in size, changes on the skin, pain or tenderness, or fluid from your nipples that is not normal. This information is not  is not intended to replace advice given to you by your health care provider. Make sure you discuss any questions you have with your health care provider. Document Revised: 11/13/2017 Document Reviewed: 11/13/2017 Elsevier Patient Education  2020 [employer]    Overactive Bladder, Adult  Overactive bladder refers to a condition in which a person has a sudden need to pass urine. The person may leak urine if he or she cannot get to the bathroom fast enough (urinary incontinence). A person with this condition may also wake up several times in the night to go to the bathroom. Overactive bladder is associated with poor nerve signals between your bladder and your brain. Your bladder may get the signal to empty before it is full. You may also have very sensitive muscles that make your bladder squeeze too soon. These symptoms might interfere with daily work or social activities. What are the causes? This condition may be associated with or caused by:  Urinary tract infection.  Infection of nearby tissues, such as the prostate.  Prostate enlargement.  Surgery on the uterus or urethra.  Bladder stones, inflammation, or tumors.  Drinking too much caffeine or alcohol.  Certain medicines, especially medicines that get rid of extra fluid in the body (diuretics).  Muscle or nerve weakness, especially from: ? A spinal cord injury. ? Stroke. ? Multiple sclerosis. ? Parkinson's disease.  Diabetes.  Constipation. What increases the risk? You may be at greater risk for overactive bladder if you:  Are an older adult.  Smoke.  Are going through menopause.  Have prostate  problems.  Have a neurological disease, such as stroke, dementia, Parkinson's disease, or multiple sclerosis (MS).  Eat or drink things that irritate the bladder. These include alcohol, spicy food, and caffeine.  Are overweight or obese. What are the signs or symptoms? Symptoms of this condition include:  Sudden, strong urge to urinate.  Leaking urine.  Urinating 8 or more times a day.  Waking up to urinate 2 or more times a night. How is this diagnosed? Your health care provider may suspect overactive bladder based on your symptoms. He or she will diagnose this condition by:  A physical exam and medical history.  Blood or urine tests. You might need bladder or urine tests to help determine what is causing your overactive bladder. You might also need to see a health care provider who specializes in urinary tract problems (urologist). How is this treated? Treatment for overactive bladder depends on the cause of your condition and whether it is mild or severe. You can also make lifestyle changes at home. Options include:  Bladder training. This may include: ? Learning to control the urge to urinate by following a schedule that directs you to urinate at regular intervals (timed voiding). ? Doing Kegel exercises to strengthen your pelvic floor muscles, which support your bladder. Toning these muscles can help you control urination, even if your bladder muscles are overactive.  Special devices. This may include: ? Biofeedback, which uses sensors to help you become aware of your body's signals. ? Electrical stimulation, which uses electrodes placed inside the body (implanted) or outside the body. These electrodes send gentle pulses of electricity to strengthen the nerves or muscles that control the bladder. ? Women may use a plastic device that fits into the vagina and supports the bladder (pessary).  Medicines. ? Antibiotics to treat bladder infection. ? Antispasmodics to stop the  bladder from releasing urine at the wrong time. ? Tricyclic antidepressants to relax   bladder muscles. ? Injections of botulinum toxin type A directly into the bladder tissue to relax bladder muscles.  Lifestyle changes. This may include: ? Weight loss. Talk to your health care provider about weight loss methods that would work best for you. ? Diet changes. This may include reducing how much alcohol and caffeine you consume, or drinking fluids at different times of the day. ? Not smoking. Do not use any products that contain nicotine or tobacco, such as cigarettes and e-cigarettes. If you need help quitting, ask your health care provider.  Surgery. ? A device may be implanted to help manage the nerve signals that control urination. ? An electrode may be implanted to stimulate electrical signals in the bladder. ? A procedure may be done to change the shape of the bladder. This is done only in very severe cases. Follow these instructions at home: Lifestyle  Make any diet or lifestyle changes that are recommended by your health care provider. These may include: ? Drinking less fluid or drinking fluids at different times of the day. ? Cutting down on caffeine or alcohol. ? Doing Kegel exercises. ? Losing weight if needed. ? Eating a healthy and balanced diet to prevent constipation. This may include:  Eating foods that are high in fiber, such as fresh fruits and vegetables, whole grains, and beans.  Limiting foods that are high in fat and processed sugars, such as fried and sweet foods. General instructions  Take over-the-counter and prescription medicines only as told by your health care provider.  If you were prescribed an antibiotic medicine, take it as told by your health care provider. Do not stop taking the antibiotic even if you start to feel better.  Use any implants or pessary as told by your health care provider.  If needed, wear pads to absorb urine leakage.  Keep a journal  or log to track how much and when you drink and when you feel the need to urinate. This will help your health care provider monitor your condition.  Keep all follow-up visits as told by your health care provider. This is important. Contact a health care provider if:  You have a fever.  Your symptoms do not get better with treatment.  Your pain and discomfort get worse.  You have more frequent urges to urinate. Get help right away if:  You are not able to control your bladder. Summary  Overactive bladder refers to a condition in which a person has a sudden need to pass urine.  Several conditions may lead to an overactive bladder.  Treatment for overactive bladder depends on the cause and severity of your condition.  Follow your health care provider's instructions about lifestyle changes, doing Kegel exercises, keeping a journal, and taking medicines. This information is not intended to replace advice given to you by your health care provider. Make sure you discuss any questions you have with your health care provider. Document Revised: 07/18/2018 Document Reviewed: 04/12/2017 Elsevier Patient Education  2020 [employer]  

## 2020-03-16 NOTE — Progress Notes (Signed)
ANNUAL PREVENTATIVE CARE GYNECOLOGY  ENCOUNTER NOTE  Subjective:       Sierra Clark is a 59 y.o. G57P1001 female here to establish care for a routine annual gynecologic exam. Reports that it has been "several years" since she has seen a gynecologist.  Has been recently changing her providers from [facility] to [facility]. The patient is not sexually active. The patient is not taking hormone replacement therapy. Patient denies post-menopausal vaginal bleeding. The patient wears seatbelts: yes. The patient participates in regular exercise: no. Has the patient ever been transfused or tattooed?: no. The patient reports that there is not domestic violence in her life.  Current complaints: 1.  Reports urinary urgency, has been ongoing for some time now  (several years).  Has to wear urinary pads. Does note that she often finds herself holding her bladder in the past.  Now is working on doing some bladder retraining, voiding every few hours purposefully, started ~ 1 week ago. Voids ~ 6-7 times per day.    Gynecologic History Patient's last menstrual period was 07/01/2012. Contraception: post menopausal status Last Pap: ~ 3 years ago per patient, performed by PCP. Results were: normal. Denies h/o abnormal pap smears.  Last mammogram: 04/2019 performed at [facility]. Results were: normal.  Last Colonoscopy: due.  Has been ~ 12 years.  Last Dexa Scan: 05/08/2018.  Results were: T score of -2.1.  Osteopenic.    Obstetric History OB History  Gravida Para Term Preterm AB Living  1 1 1     1   SAB TAB Ectopic Multiple Live Births          1    # Outcome Date GA Lbr Len/2nd Weight Sex Delivery Anes PTL Lv  1 Term 10/16/94   5 lb 11 oz (2.58 kg) F Vag-Spont   LIV    Obstetric Comments  Menstrual age: 57    Age 1st Pregnancy: 39    Past Medical History:  Diagnosis Date  . Mammographic microcalcification    left    Family History  Problem Relation Age of Onset  . Breast  cancer Mother 24  . Hypertension Mother   . Diabetes Mother   . Dementia Mother   . Arthritis Father   . Hypertension Father     Past Surgical History:  Procedure Laterality Date  . CHOLECYSTECTOMY  1999  . COLONOSCOPY  2009  . KNEE SURGERY      Social History   Socioeconomic History  . Marital status: Married    Spouse name: Not on file  . Number of children: Not on file  . Years of education: Not on file  . Highest education level: Not on file  Occupational History  . Not on file  Tobacco Use  . Smoking status: Never Smoker  . Smokeless tobacco: Never Used  Vaping Use  . Vaping Use: Never used  Substance and Sexual Activity  . Alcohol use: No  . Drug use: No  . Sexual activity: Not Currently  Other Topics Concern  . Not on file  Social History Narrative  . Not on file   Social Determinants of Health   Financial Resource Strain:   . Difficulty of Paying Living Expenses: Not on file  Food Insecurity:   . Worried About 2010 in the Last Year: Not on file  . Ran Out of Food in the Last Year: Not on file  Transportation Needs:   . Lack of Transportation (Medical): Not on file  .  Lack of Transportation (Non-Medical): Not on file  Physical Activity:   . Days of Exercise per Week: Not on file  . Minutes of Exercise per Session: Not on file  Stress:   . Feeling of Stress : Not on file  Social Connections:   . Frequency of Communication with Friends and Family: Not on file  . Frequency of Social Gatherings with Friends and Family: Not on file  . Attends Religious Services: Not on file  . Active Member of Clubs or Organizations: Not on file  . Attends Banker Meetings: Not on file  . Marital Status: Not on file  Intimate Partner Violence:   . Fear of Current or Ex-Partner: Not on file  . Emotionally Abused: Not on file  . Physically Abused: Not on file  . Sexually Abused: Not on file    Current Outpatient Medications on File Prior  to Visit  Medication Sig Dispense Refill  . Biotin 1000 MCG tablet Take 1,000 mcg by mouth daily.    [name] OVER THE COUNTER MEDICATION Ostero BioFlex     No current facility-administered medications on file prior to visit.    No Known Allergies    Review of Systems ROS Review of Systems - General ROS: negative for - chills, fatigue, fever, hot flashes, night sweats, weight gain or weight loss.  Psychological ROS: negative for - anxiety, decreased libido, depression, mood swings, physical abuse or sexual abuse Ophthalmic ROS: negative for - blurry vision, eye pain or loss of vision ENT ROS: negative for - headaches, hearing change, visual changes or vocal changes Allergy and Immunology ROS: negative for - hives, itchy/watery eyes or seasonal allergies Hematological and Lymphatic ROS: negative for - bleeding problems, bruising, swollen lymph nodes or weight loss Endocrine ROS: negative for - galactorrhea, hair pattern changes, hot flashes, malaise/lethargy, mood swings, palpitations, polydipsia/polyuria, skin changes, temperature intolerance or unexpected weight changes Breast ROS: negative for - new or changing breast lumps or nipple discharge Respiratory ROS: negative for - cough or shortness of breath Cardiovascular ROS: negative for - chest pain, irregular heartbeat, palpitations or shortness of breath.  Gastrointestinal ROS: no abdominal pain, change in bowel habits, or black or bloody stools Genito-Urinary ROS: no dysuria, trouble voiding, or hematuria Musculoskeletal ROS: negative for - joint pain or joint stiffness Neurological ROS: negative for - bowel and bladder control changes Dermatological ROS: negative for rash and skin lesion changes   Objective:   BP 120/81   Pulse (!) 102   Ht 5\' 7"  (1.702 m)   Wt 180 lb 11.2 oz (82 kg)   LMP 07/01/2012   BMI 28.30 kg/m  CONSTITUTIONAL: Well-developed, well-nourished female in no acute distress.  PSYCHIATRIC: Normal mood and  affect. Normal behavior. Normal judgment and thought content. NEUROLGIC: Alert and oriented to person, place, and time. Normal muscle tone coordination. No cranial nerve deficit noted. HENT:  Normocephalic, atraumatic, External right and left ear normal. Oropharynx is clear and moist EYES: Conjunctivae and EOM are normal. Pupils are equal, round, and reactive to light. No scleral icterus.  NECK: Normal range of motion, supple, no masses.  Normal thyroid.  SKIN: Skin is warm and dry. No rash noted. Not diaphoretic. No erythema. No pallor. CARDIOVASCULAR: Normal heart rate noted, regular rhythm, no murmur. RESPIRATORY: Clear to auscultation bilaterally. Effort and breath sounds normal, no problems with respiration noted. BREASTS: Symmetric in size. No masses, skin changes, nipple drainage, or lymphadenopathy. ABDOMEN: Soft, normal bowel sounds, no distention noted.  No tenderness,  rebound or guarding.  BLADDER: Normal PELVIC:  Bladder no bladder distension noted  Urethra: normal appearing urethra with no masses, tenderness or lesions  Vulva: normal appearing vulva with no masses, tenderness or lesions  Vagina: normal appearing vagina without discharge, no lesions and moderately atrophic  Cervix: normal appearing cervix without discharge or lesions  Uterus: uterus is normal size, shape, consistency and nontender  Adnexa: normal adnexa in size, nontender and no masses  RV: External Exam NormaI, No Rectal Masses and Normal Sphincter tone  MUSCULOSKELETAL: Normal range of motion. No tenderness.  No cyanosis, clubbing, or edema.  2+ distal pulses. LYMPHATIC: No Axillary, Supraclavicular, or Inguinal Adenopathy.   Labs: No results found for: WBC, HGB, HCT, MCV, PLT  No results found for: CREATININE, BUN, NA, K, CL, CO2  No results found for: ALT, AST, GGT, ALKPHOS, BILITOT  No results found for: CHOL, HDL, LDLCALC, LDLDIRECT, TRIG, CHOLHDL  No results found for: TSH  No results found for:  HGBA1C   Assessment:   1. Encounter for well woman exam with routine gynecological exam   2. Encounter to establish care with new doctor   3. Encounter for medical examination to establish care   4. Breast cancer screening by mammogram   5. Cervical cancer screening   6. Colon cancer screening   7. Urinary urgency     Plan:  - Pap: Pap Co Test performed today. Continue q 3 year screening.  - Mammogram: Ordered.  - Stool Guaiac Testing:  Not performed, colonoscopy ordered - Labs: Reports having labs performed within the past year, provider not in EPIC.  Plans to establish with PCP at [facility] - Routine preventative health maintenance measures emphasized: Exercise/Diet/Weight control, Tobacco Warnings, Alcohol/Substance use risks and Stress Management.   - Osteopoenia, encouraged intake of Vitamin D and calcium.  COVID Vaccination status: Has completed vaccination status and booster injection.  Flu vaccine: up to date.  Return to Clinic - 1 Year   Hildred Laser, MD  [facility]

## 2020-03-19 LAB — CYTOLOGY - PAP
Comment: NEGATIVE
Diagnosis: NEGATIVE
High risk HPV: NEGATIVE

## 2020-03-23 ENCOUNTER — Telehealth: Payer: Self-pay

## 2020-03-23 NOTE — Telephone Encounter (Signed)
Patient called in stating that she needs documentation for her insurance that she had her AE done this year.  Could you please advise?

## 2020-03-29 NOTE — Telephone Encounter (Signed)
Spoke to pt and she was able to go on mychart and print off the information that she needed.

## 2020-03-29 NOTE — Telephone Encounter (Signed)
Received a red flag alert from IBD chat bot.    Called pt and she reports 1-2 hard stools per day and abdominal pain due to constipation.    Takes miralax every other day b/c daily dosing gives her diarrhea.  has not taken it yet today b/c she's been out running errands and once she takes it she'll need to have a BM.    Taking budesonide 3mg  daily.    Pt asks I call her back 12/21 as she will have taken the miralax and likely had a soft BM by that time.

## 2020-03-30 NOTE — Telephone Encounter (Signed)
Pt took 1 dose miralax as planned last night and had 2-3 small soft/formed BMs after taking it.    Today had 3 small soft/formed BMs in the past hour but has not been able to have a full BM. Feels like more needs to come out.    Past week has aching moderate/diffuse abdominal pain that can be severe at times. Pain mostly occurs just before she has a BM and is mostly relieved after BM.    Pt refuses to take 1 daily dose miralax as that gives her diarrhea.    Suggested she try 1/2 dose miralax daily as she is taking 1 dose every other day now but pt prefers to hear recs from MD.    Pt has phone follow up with MD 1/21    Ok to [facility] with MD's recs.

## 2020-03-31 NOTE — Telephone Encounter (Signed)
Informed pt provider's comments below.    Pt frustrate about having the same constipation, abdominal pain and eating small meals past 3 months.   she's afraid to eat larger meals as it leads to a BM and the abdominal pain increases when she is on the toilet.    Pt does not feel she is getting the care at [facility] that she deserves and wants more than a phone call with the MD every 3 months.  She's asking if she needs to have a scope to look inside instead of just taking meds.      Advised pt based on 2020 colo and reported symptoms the doctor feels budesonide and miralax are the safest most appropriate treatments for her sxs at this time.    After talking with pt she does feel we are trying to help her feel better and that we understand her frustration with ongoing sxs.   will keep 1/21 phone visit as scheduled.  will try to write down her questions before the visit as she often forgets the questions/concerns she has during the visit with MD.         Gilberto Better, NP  Doreen Beam, RN  Caller: Unspecified (1 month ago)  Hi Chelsea Ball,   Advise increasing miralax as tolerated until constipation relieved, and then continue typical dosing regimen.     Follow up with Dr. [name] as scheduled in Jan.     [name]

## 2020-04-20 MED ORDER — BUDESONIDE DR - ER 3 MG CAPSULE,DELAYED,EXTENDED RELEASE
3 | ORAL | 1 refills | Status: DC
Start: 2020-04-20 — End: 2020-06-14

## 2020-04-30 ENCOUNTER — Ambulatory Visit
Admit: 2020-04-30 | Payer: MEDICAID | Attending: Student in an Organized Health Care Education/Training Program | Primary: Physician

## 2020-04-30 DIAGNOSIS — K5010 Crohn's disease of large intestine without complications: Secondary | ICD-10-CM

## 2020-04-30 MED ORDER — BUDESONIDE DR - ER 3 MG CAPSULE,DELAYED,EXTENDED RELEASE
3 | ORAL_CAPSULE | Freq: Every morning | ORAL | 1 refills | Status: DC
Start: 2020-04-30 — End: 2020-06-03

## 2020-04-30 NOTE — Progress Notes (Signed)
Chelsea Ball is a 60 y.o. female seen via telehealth for a follow up visit  with chief complaint of Abdominal Pain   at the request of Derrill Center, DO.    The patient requested this visit. I obtained consent from the patient to conduct this visit by telephone only. I spent a total of 24 minutes in audio communication with this patient.      History of Present Illness  This is a 60 y.o. female with history of irritable bowel syndrome with constipation, diverticulitis, cholecystectomy (2019), depression, substance use and hysterectomy who is referred to gastroenterology for diverticulosis with abdominal pain and incomplete colonoscopy, following up after colonoscopy was able to be completed.     Background history:  Referred from Dr. Casimiro Needle in [place]. Consult question: "Patient with diverticulosis / diverticulitis (treated).[name] disorted lumen in splenic flexure and descending colon- Please evaluate for possible surgery?"    The patient describes her problem as having problems having a bowel movement. When she eats she has to eat some fruit to have a bowel movement and it can be runny. The patient reports weight loss because she gets severe stomach pain when she eats. The pain is happening on a daily basis. The entire stomach is painful, not one particular area- she feels she has to pant (breathe heavily) which can calm the pain a little bit. She reports that she has been put on bed rest by Dr. Terance Hart because of this. She has had weight loss related to eating less, she was 142 pounds, now down to 115 pounds. The only thing she can eat is soft foods, and reports she's not able to eat much. If she eats more she gets severe pain. She has some nausea but no vomiting. She reports being very weak now. She is also having rotator cuff problem. She reports having bowel movements that are runny - they used to be small, solid, curved, but now have transitioned to being runny. Some days she does not have a bowel  movement, and some days she will have a few runny bowel movements. She has some fear of eating. There is no blood in the stools.     She has not seen a nutritionist, does not take any nutrition shakes. She has stopped taking any of her medications because of the abdominal pain, uses Norco maybe once a week for rotator cuff, occasionally takes Calcium.    The patient reads me her medication list, although she is not curently taking these: Mirtazapine 15, megestrol 15, 67m hydroxyzine, 672mtizanadine PRN, calcium +vitamin D, docusate, fluoxetine, lamotrigine, albuterol, QVAR, Norco 5. She takes esomeprazole 4048ms needed for heartburn. She has tried FiberChoice tablets, didn't help her. Prior medicines from Dr. PetTerance Hartve her diarrhea. She tried miralax. She takes lactulose sometimes when constipated, helps her go. No enemas. Her prior medication list from June 2020 note of Dr. PetTerance Hartso mentions several PPI's, as well as metamucil, Creon, Amitiza, Bentyl, despiramine, Donnatal, fleet's enemas, but she reports prior medicines prescribed by Dr. PetTerance Hartst gave her diarrhea so she's not on them now.     She was seen in June 2020 by her gastroenterologist Dr. PetTerance Harto noted that he had attempted colonoscopy but was unable to advance past an area.     Her cholecystectomy did not help her pain in 2019.     Note, utox has been positive for cocaine metabolites on 05/29/18, 12/25/16, ad 01/25/16.     02/28/19 phone visit:  At  my initial clinic visit on 01/21/19, I planned to repeat colonoscopy with anesthesia. 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 I started the patient on mesalamine 2.4g daily which she did not tolerate after it causing sour stomach for a week.     At first she was having small bowel movements with postprandial abdominal pain. She is still  having thin stools and postprandial discomfort that begins even right when she begins to eat a meal that has caused some fear of eating larger meals. At our 02/28/19 visit she is a little better but not much. Now she reports walking more after being off bed rest and is having still thin bowel movements. She reports still not eating very well but thinks she has not lost further weight. She has not filled other medications with her PCP at this point. She is back on her psych meds. She takes opioids but only rarely, not every day.     05/21/19 visit:  At our 04/29/18 video visit, she was given a course of rifaximin with some improvement initially but symptoms returned. She was prescribed a course of Uceris but was not able to get this due to insurance for a couple of months.     At our 05/21/19 visit, she reports symptoms are getting worse. She has frequent small volume diarrhea, sometimes with urges to have a bowel movement but nothing comes out. When she does have bowel movments, it tends to be small bits or runny. She has had some episodes of incontinence. Some days she goes to the bathroom every 20 or 30 minutes and bowel movements are still very thin- bowel movements are like very long worms if it's not watery. Her abdominal pain is severe- reports it is worse than labor pains, intermittent spasms affecting the whole abdomen - she has to lay still and focus on her breathing to get the pain improve. It involves the whole abdomen and tends to happen when she is on the toilet trying to have a bowel movement. She continues to be afraid to eat because when she eats she usually has to go to the bathroom within 10-15 minutes for a bowel movement and then she gest the pain while on the toilet. She changes her body position (leaning backwards) sometimes helps a little stool come out. She continues to lose weight, she thinks now down to maybe 100 pounds although does not have a scale to confirm this.     Interval Events-  08/29/19 visit:   At our 05/21/19 visit I had referred the patient to colorectal surgery given the ongoing weight loss and severity of her symptoms. She then had the Uceris approved and started it June 20, 2019 and did experience some improvement in symptoms so surgery was not pursued at the time. She reported constipation and was prescribed Miralax in April 2021.     She is continuing to use Miralax with sometimes loose stools. Appetite is increased on the steroids. She feels a lot better on the Uceris, not miserable and waking up overnight anymore. She still has some episodes of wanting to poop and not being able to poop. She's eating moderately. She has 3 more weeks of Uceris left right now. Her weight has gone up, 113# up to 130.     Interval Events: 01/26/20  She had an ED visit 11/05/19 for abdominal pain. Mobic  was refilled and gabapentin prescribed.     Lower abd pain, sometimes wrapping around to the back. Pelvic ultrasound was normal. Has been having problems with bowel movements. Has not received budesonide this week. Entire lower abdomen. miralax works only fair. Budesonide was increasing appetite, but eating makes pain worse. Throughout day there is a periodic ache. bm's 1-2 times per day, but has to lean back and forth to have bm's, normally 3-4 small piees in the toilet. Symptoms tend to be relieved with bowel movements. Weight is around 142.     Interval events: 01/29/20:  01/29/20 plan was to take Uceris every other day for 3 months to see if it can be weaned, prescribed ongoing miralax and a bowel preparation in case needed. Referred for anorectal manometry/biofeedback but insurance reportedly does not cover biofeedback. Taking miralax every other day because daily use gives diarrhea. PCP tapering pain medications. Entocort 9m tabs prescribed    She's having problems with the budesonide. She is back to feeling how she was before starting steroids. Bowel habits have been loose and runny, back and  forth to the bathroom all day, with stomach pains. Appetite has been low from stomach hurting, very bad, weight is now going down again. This happened when budesonide changed from 9 to 3. Currently not taking miralax. No vomiting. Weight is now 134.     Past Medical History:   Diagnosis Date    Abdominal pain 01/21/2019    Bipolar 1 disorder (CMS code) 01/21/2019    Depression 01/21/2019    GERD (gastroesophageal reflux disease) 05/04/2016      Past Surgical History:   Procedure Laterality Date    CHOLECYSTECTOMY      HYSTERECTOMY        Social History     Tobacco Use    Smoking status: Current Every Day Smoker     Years: 8.00    Smokeless tobacco: Never Used    Tobacco comment: 3 cigarettes/day   Substance and Sexual Activity    Alcohol use: Yes     Comment: holidays only    Drug use: Not Currently     Comment: cocaine 7 years ago    Sexual activity: Not on file   Social History Narrative    Not on file     Family history:  Father and grandmother with colon cancer, also uncles with colon cancer. Father was in his 572'swhen he got colon cancer.     Medications:   See HPI    Allergies/Contraindications  No Known Allergies     Social:  3 cigarettes per day  Very rare alcohol  Reports not having used drugs for years      Review of Systems  Gen: No fevers/chills, reports no weight loss since last visit  Cardiovascular: No chest pain, no syncope  All other systems were reviewed and are negative.     Physical Exam:  No physical exam due to video visit    I have personally reviewed the labs, imaging and other diagnostics below:     Latest [facility]  No results found for: HGB, WBC, MCV, PLT, INR, AST, ALT, TBILI, ALKP, ESR, CRP    Latest Outside Labs  01/04/18 Hgb 13.8, Plt 277, WBC 3.4  Calcium 10.1  Lipase 25  HIV negative  TSH 1.33    05/29/18   Utox positive for cocaine metabolites  Hgb 12.0, Plt 229  TSH 3.34    Latest Imaging and Other Diagnostics  01/28/19 colonoscopy (  [place])  IMPRESSIONS:   1. Two  sessile polyps were found in the ascending   colon and at the cecum; polypectomy was performed with cold forceps   2. 4 mm sessile polyp was found in the sigmoid colon; polypectomy was   performed using a cold snare   3. Two sessile polyps were found in the rectum; polypectomy was   performed with a cold snare   4. There was severe diverticulosis in the sigmoid colon, ranging from   18cm to 30cm from the anal verge (measured on withdrawal with the   colon straight and short). 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 This was biopsied for   histology   5. Retroflexed views revealed no abnormalities     FINAL PATHOLOGIC DIAGNOSIS  A. Right colon, polyps, biopsy: Tubular adenoma.       B. Sigmoid polyp, biopsy: Sessile serrated adenoma.  C. Sigmoid, biopsy: Mucosal prolapse.   D. Rectal polyps, biopsy: Sessile serrated adenoma, fragments.       07/23/18 Colonoscopy:  Indication: Abdominal pain (Fentanyl 150 total, Versed 4 total)  Comments: Unable to pass distal transverse colon, diverticulosis noted.     07/05/18 Abdominal ultrasound:   Impression: Fatty infiltration of liver, but without evidence of hepatomegaly, splenomegaly, portal hypertension, focal solid mass lesion, biliary ductal dilatation, ascites nor varices.   S/p cholecystectomy without biliary dilatation or choledocholithiasis  Simple right renal cyst    EGD 08/05/18  Impression: Acute gastritis without bleeding  Pathology:  Antrum biopsy: Mild reactive gastropathy. No intestinal metplasia, H pylori, or neoplasia.    CT abdomen/pelvis with contrast 01/06/19:   Impression:   S/p cholecystectomy clips without evidence of biliary ductal dilatation or choledocholithiasis seen  S/p hysterectomy findings  Fatty infiltration of liver  Bilateral simple renal cysts, largest of which measures 11 x 16 x 1m in mid lower pole junction region of  right kidney    01/04/18 CT abdomen/pelvis with IV contrast: (abdominal pain):  FINDINGS:  Mediastinum: A small hiatal hernia is present.    Liver: A small amount of focal fatty infiltration is seen in the liver adjacent  to the falciform ligament. No suspicious masses are seen within the liver.  Gallbladder and bile ducts: The gallbladder has been removed.  Pancreas: Normal. No ductal dilation.  Spleen: Normal. No splenomegaly.  Adrenals: Normal. No mass.  Kidneys and ureters: The right kidney demonstrates a simple cyst in the lower  pole measuring 1.6 cm in diameter. The left kidney demonstrates a simple cyst  in the lower pole measuring 6 mm in diameter. There is no hydronephrosis. No  renal stones are identified.  Stomach and bowel: Unremarkable. No obstruction. No mucosal thickening.  Appendix: No evidence of appendicitis.  Intraperitoneal space: Unremarkable. No free air. No significant fluid  collection.  Vasculature: Unremarkable. No abdominal aortic aneurysm.  Lymph nodes: Unremarkable. No enlarged lymph nodes.    Bladder: Unremarkable as visualized.  Reproductive: Unremarkable as visualized.  Bones/joints: Unremarkable. No acute fracture.  Soft tissues: Unremarkable.  IMPRESSION:  Small hiatal hernia.  Bilateral renal cysts.  No acute findings.  Colonic diverticulosis.    04/13/17 CT abd/pelvis with contrast:   IMPRESSION:  1. Small benign-appearing bilateral renal cysts.  2. Rectosigmoid diverticulosis.  3. 3 mm pulmonary nodule anterior right lung base. Recommend  followup as per Fleischner Society criteria.    01/02/14 CT abd/pelvis  with IV contrast:   1. Colonic diverticulosis. Apparent mild circumferential wall thickening of    the mid sigmoid colon, which is favored to be artifactual, related to    nondistention. However, cannot fully exclude early acute sigmoid    diverticulitis or focal colitis. Recommend correlation with the patient's    clinical symptoms.   2. Echogenic foci with within the  dependent gallbladder likely represent    gallstones. However, recommend followup with gallbladder ultrasound to fully    exclude the less likely possibility of a polypoid gallbladder lesion.   3. Tiny distal esophageal diverticulum.   4. Noncalcified 3 mm right middle lobe pulmonary nodule. Recommend comparison    with any prior CT examinations that are available. If none are available,    recommend followup according to Fleischner criteria.    Colonoscopy 10/21/14: report not available    EGD 12/23/2014: report not available      Assessment and Plan  Problem List:  # Abdominal pain  # Weight loss  # Diverticulosis    This is a 60 y.o. female with history of irritable bowel syndrome with constipation, diverticulitis, cholecystectomy (2019), depression, substance use and hysterectomy who is referred to gastroenterology for diverticulosis with abdominal pain.     Her outside gastroenterologist was not able to complete a colonoscopy in 2020 due to the severity of her diverticulosis. Subsequent colonoscopy with anesthesia here at [facility] 01/28/19 was able to be completed but with significant difficulty showing quite severe diverticulosis with endoscopic appearance suggestive of SCAD (however biopsies just showed mucosal prolapse, not inflammation).     Overall I do suspect that her lower abdominal symptoms are due to extreme sigmoid diverticulosis with intermittent partially obstructive symptoms. She was not able to tolerate oral mesalamine, but a trial of Uceris has led to significant improvement in her abdominal pain, bowel movements and appetite, associated with regain of much of the weight she had previously lost. This suggests that there is a component of inflammation associated with the diverticulosis (diagnosis of SCAD) despite the biopsies not showing any inflammation.     The budesonide has resulted in significant improvement of her symptoms, PO intake and weight loss and has likely spared her from needing a  sigmoid colectomy at this point. She was on Uceris for about 7 months and then was switched to Entocort at 85m, but this did not haave the same benefit and abdominal pains/bowwel habits worsened again and she started losing weight. I will increase the budeosnide (entocort) to 639mfor a month and recheck to see if she is able to wean down further. We also discussed the possibility of topical enema therapies but she is resistant to this at this point. At some point we need to transition to a medicine that will be safer long term such as long term mesalamine enemas or mesalamine PO re-trial or intermittne steroid enemas. I am hesitant to use biologics given that there was no inflammation on colon biopsies but that could be a consideration if it could spare her a partial colectomy.     Summary of Recommendations:   -Increase entocort from 102m19mo 6mg68mily  -Repeat phone call in 1 month with me

## 2020-04-30 NOTE — Patient Instructions (Signed)
Dear Ms. Carras,    It was nice to talk with you today! I recommend:     Increase the budesonide from 3mg  daily to 6mg  daily.     I will call you again in a month.     Sincerely,  Dr. 

## 2020-05-03 NOTE — Telephone Encounter (Signed)
Called the pt.  Pt c/o abdominal pain 6 out of 10  always started after eating, so she was afraid to eat, and also has urgent to go to bathroom but always small amount of BM for 5-7 times.     She has insomnia and unable to sleep well at night. Some time she would get up in the afternoon, sometime she might skip medication. On 1/22, she did not take any medication.    She started to take Entocort 6mg  yesterday afternoon.  When I called this morning at 11:30, she just got up and hasn't have breakfast, nor medication yet.    Generally, she ate about 2 meals a day depends on what time she got up. She felt fine when called. No abdominal pain.     Instructed pt to follow Dr. instruction and take medication on time per ordered.  Encouraged to establish routine schedule for meals and bedtime. Instructed to report symptoms and will re-evaluate according.     [name]

## 2020-05-31 MED ORDER — MESALAMINE 1.2 GRAM TABLET,DELAYED RELEASE
1.2 | ORAL_TABLET | Freq: Every day | ORAL | 1 refills | Status: DC
Start: 2020-05-31 — End: 2020-08-01

## 2020-05-31 NOTE — Patient Instructions (Signed)
Dear Chelsea Ball,     I have prescribed Lialda 1.2g pill to take once per day. This medication is to decrease inflammation in the colon. I will touch base in 1 month to see how you are doing. If you are doing ok we may try to decrease the budesonide and continue the Lialda as a long term medicine. If your symptoms are very bad we can have you talk to the surgeon again.     Dr. [name]

## 2020-05-31 NOTE — Progress Notes (Signed)
Chelsea Ball is a 60 y.o. female seen via telehealth for a follow up visit  with chief complaint of Abdominal Pain   at the request of Derrill Center, DO.    The patient requested this visit. I obtained consent from the patient to conduct this visit by telephone only. I spent a total of 20 minutes in audio communication with this patient.      History of Present Illness  This is a 60 y.o. female with history of irritable bowel syndrome with constipation, diverticulitis, cholecystectomy (2019), depression, substance use and hysterectomy who is referred to gastroenterology for diverticulosis with abdominal pain and incomplete colonoscopy, following up after colonoscopy was able to be completed.     Background history:  Referred from Dr. Casimiro Needle in [place]. Consult question: "Patient with diverticulosis / diverticulitis (treated).[name] disorted lumen in splenic flexure and descending colon- Please evaluate for possible surgery?"    The patient describes her problem as having problems having a bowel movement. When she eats she has to eat some fruit to have a bowel movement and it can be runny. The patient reports weight loss because she gets severe stomach pain when she eats. The pain is happening on a daily basis. The entire stomach is painful, not one particular area- she feels she has to pant (breathe heavily) which can calm the pain a little bit. She reports that she has been put on bed rest by Dr. Terance Hart because of this. She has had weight loss related to eating less, she was 142 pounds, now down to 115 pounds. The only thing she can eat is soft foods, and reports she's not able to eat much. If she eats more she gets severe pain. She has some nausea but no vomiting. She reports being very weak now. She is also having rotator cuff problem. She reports having bowel movements that are runny - they used to be small, solid, curved, but now have transitioned to being runny. Some days she does not have a bowel  movement, and some days she will have a few runny bowel movements. She has some fear of eating. There is no blood in the stools.     She has not seen a nutritionist, does not take any nutrition shakes. She has stopped taking any of her medications because of the abdominal pain, uses Norco maybe once a week for rotator cuff, occasionally takes Calcium.    The patient reads me her medication list, although she is not curently taking these: Mirtazapine 15, megestrol 15, 27m hydroxyzine, 65mtizanadine PRN, calcium +vitamin D, docusate, fluoxetine, lamotrigine, albuterol, QVAR, Norco 5. She takes esomeprazole 406ms needed for heartburn. She has tried FiberChoice tablets, didn't help her. Prior medicines from Dr. PetTerance Hartve her diarrhea. She tried miralax. She takes lactulose sometimes when constipated, helps her go. No enemas. Her prior medication list from June 2020 note of Dr. PetTerance Hartso mentions several PPI's, as well as metamucil, Creon, Amitiza, Bentyl, despiramine, Donnatal, fleet's enemas, but she reports prior medicines prescribed by Dr. PetTerance Hartst gave her diarrhea so she's not on them now.     She was seen in June 2020 by her gastroenterologist Dr. PetTerance Harto noted that he had attempted colonoscopy but was unable to advance past an area.     Her cholecystectomy did not help her pain in 2019.     Note, utox has been positive for cocaine metabolites on 05/29/18, 12/25/16, ad 01/25/16.     02/28/19 phone visit:  At  my initial clinic visit on 01/21/19, I planned to repeat colonoscopy with anesthesia. 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 I started the patient on mesalamine 2.4g daily which she did not tolerate after it causing sour stomach for a week.     At first she was having small bowel movements with postprandial abdominal pain. She is still  having thin stools and postprandial discomfort that begins even right when she begins to eat a meal that has caused some fear of eating larger meals. At our 02/28/19 visit she is a little better but not much. Now she reports walking more after being off bed rest and is having still thin bowel movements. She reports still not eating very well but thinks she has not lost further weight. She has not filled other medications with her PCP at this point. She is back on her psych meds. She takes opioids but only rarely, not every day.     05/21/19 visit:  At our 04/29/18 video visit, she was given a course of rifaximin with some improvement initially but symptoms returned. She was prescribed a course of Uceris but was not able to get this due to insurance for a couple of months.     At our 05/21/19 visit, she reports symptoms are getting worse. She has frequent small volume diarrhea, sometimes with urges to have a bowel movement but nothing comes out. When she does have bowel movments, it tends to be small bits or runny. She has had some episodes of incontinence. Some days she goes to the bathroom every 20 or 30 minutes and bowel movements are still very thin- bowel movements are like very long worms if it's not watery. Her abdominal pain is severe- reports it is worse than labor pains, intermittent spasms affecting the whole abdomen - she has to lay still and focus on her breathing to get the pain improve. It involves the whole abdomen and tends to happen when she is on the toilet trying to have a bowel movement. She continues to be afraid to eat because when she eats she usually has to go to the bathroom within 10-15 minutes for a bowel movement and then she gest the pain while on the toilet. She changes her body position (leaning backwards) sometimes helps a little stool come out. She continues to lose weight, she thinks now down to maybe 100 pounds although does not have a scale to confirm this.     Interval Events-  08/29/19 visit:   At our 05/21/19 visit I had referred the patient to colorectal surgery given the ongoing weight loss and severity of her symptoms. She then had the Uceris approved and started it June 20, 2019 and did experience some improvement in symptoms so surgery was not pursued at the time. She reported constipation and was prescribed Miralax in April 2021.     She is continuing to use Miralax with sometimes loose stools. Appetite is increased on the steroids. She feels a lot better on the Uceris, not miserable and waking up overnight anymore. She still has some episodes of wanting to poop and not being able to poop. She's eating moderately. She has 3 more weeks of Uceris left right now. Her weight has gone up, 113# up to 130.     Interval Events: 01/26/20  She had an ED visit 11/05/19 for abdominal pain. Mobic  was refilled and gabapentin prescribed.     Lower abd pain, sometimes wrapping around to the back. Pelvic ultrasound was normal. Has been having problems with bowel movements. Has not received budesonide this week. Entire lower abdomen. miralax works only fair. Budesonide was increasing appetite, but eating makes pain worse. Throughout day there is a periodic ache. bm's 1-2 times per day, but has to lean back and forth to have bm's, normally 3-4 small piees in the toilet. Symptoms tend to be relieved with bowel movements. Weight is around 142.     Interval events: 01/29/20:  01/29/20 plan was to take Uceris every other day for 3 months to see if it can be weaned, prescribed ongoing miralax and a bowel preparation in case needed. Referred for anorectal manometry/biofeedback but insurance reportedly does not cover biofeedback. Taking miralax every other day because daily use gives diarrhea. PCP tapering pain medications. Entocort 29m tabs prescribed    She's having problems with the budesonide. She is back to feeling how she was before starting steroids. Bowel habits have been loose and runny, back and  forth to the bathroom all day, with stomach pains. Appetite has been low from stomach hurting, very bad, weight is now going down again. This happened when budesonide changed from 9 to 3. Currently not taking miralax. No vomiting. Weight is now 134.     Interval Events 05/31/20:   Pain comes and goes, pants/breathes wih it. Happening 4-5 times per day. Pain is in the lower abdomen. Appetite increased a bit, weight coming back. Weight 141 now. Taking probiotic. Stools are smaller with some mucus. Stools are soft solids. Budesonide helps with the appetite and keeping weight up but not with the abdominal pains.     Past Medical History:   Diagnosis Date    Abdominal pain 01/21/2019    Bipolar 1 disorder (CMS code) 01/21/2019    Depression 01/21/2019    GERD (gastroesophageal reflux disease) 05/04/2016      Past Surgical History:   Procedure Laterality Date    CHOLECYSTECTOMY      HYSTERECTOMY        Social History     Tobacco Use    Smoking status: Current Every Day Smoker     Years: 8.00    Smokeless tobacco: Never Used    Tobacco comment: 3 cigarettes/day   Substance and Sexual Activity    Alcohol use: Yes     Comment: holidays only    Drug use: Not Currently     Comment: cocaine 7 years ago    Sexual activity: Not on file   Social History Narrative    Not on file     Family history:  Father and grandmother with colon cancer, also uncles with colon cancer. Father was in his 563'swhen he got colon cancer.     Medications:   See HPI    Allergies/Contraindications  No Known Allergies     Social:  3 cigarettes per day  Very rare alcohol  Reports not having used drugs for years      Review of Systems  Gen: No fevers/chills, reports no weight loss since last visit  Cardiovascular: No chest pain, no syncope  All other systems were reviewed and are negative.     Physical Exam:  No physical exam due to video visit    I have personally reviewed the labs, imaging and other diagnostics below:     Latest [facility]  No  results found for: HGB, WBC,  MCV, PLT, INR, AST, ALT, TBILI, ALKP, ESR, CRP    Latest Outside Labs  01/04/18 Hgb 13.8, Plt 277, WBC 3.4  Calcium 10.1  Lipase 25  HIV negative  TSH 1.33    05/29/18   Utox positive for cocaine metabolites  Hgb 12.0, Plt 229  TSH 3.34    Latest Imaging and Other Diagnostics  01/28/19 colonoscopy ([place])  IMPRESSIONS:   1. Two sessile polyps were found in the ascending   colon and at the cecum; polypectomy was performed with cold forceps   2. 4 mm sessile polyp was found in the sigmoid colon; polypectomy was   performed using a cold snare   3. Two sessile polyps were found in the rectum; polypectomy was   performed with a cold snare   4. There was severe diverticulosis in the sigmoid colon, ranging from   18cm to 30cm from the anal verge (measured on withdrawal with the   colon straight and short). 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 This was biopsied for   histology   5. Retroflexed views revealed no abnormalities     FINAL PATHOLOGIC DIAGNOSIS  A. Right colon, polyps, biopsy: Tubular adenoma.       B. Sigmoid polyp, biopsy: Sessile serrated adenoma.  C. Sigmoid, biopsy: Mucosal prolapse.   D. Rectal polyps, biopsy: Sessile serrated adenoma, fragments.       07/23/18 Colonoscopy:  Indication: Abdominal pain (Fentanyl 150 total, Versed 4 total)  Comments: Unable to pass distal transverse colon, diverticulosis noted.     07/05/18 Abdominal ultrasound:   Impression: Fatty infiltration of liver, but without evidence of hepatomegaly, splenomegaly, portal hypertension, focal solid mass lesion, biliary ductal dilatation, ascites nor varices.   S/p cholecystectomy without biliary dilatation or choledocholithiasis  Simple right renal cyst    EGD 08/05/18  Impression: Acute gastritis without bleeding  Pathology:  Antrum biopsy: Mild reactive gastropathy. No intestinal  metplasia, H pylori, or neoplasia.    CT abdomen/pelvis with contrast 01/06/19:   Impression:   S/p cholecystectomy clips without evidence of biliary ductal dilatation or choledocholithiasis seen  S/p hysterectomy findings  Fatty infiltration of liver  Bilateral simple renal cysts, largest of which measures 11 x 16 x 5m in mid lower pole junction region of right kidney    01/04/18 CT abdomen/pelvis with IV contrast: (abdominal pain):  FINDINGS:  Mediastinum: A small hiatal hernia is present.    Liver: A small amount of focal fatty infiltration is seen in the liver adjacent  to the falciform ligament. No suspicious masses are seen within the liver.  Gallbladder and bile ducts: The gallbladder has been removed.  Pancreas: Normal. No ductal dilation.  Spleen: Normal. No splenomegaly.  Adrenals: Normal. No mass.  Kidneys and ureters: The right kidney demonstrates a simple cyst in the lower  pole measuring 1.6 cm in diameter. The left kidney demonstrates a simple cyst  in the lower pole measuring 6 mm in diameter. There is no hydronephrosis. No  renal stones are identified.  Stomach and bowel: Unremarkable. No obstruction. No mucosal thickening.  Appendix: No evidence of appendicitis.  Intraperitoneal space: Unremarkable. No free air. No significant fluid  collection.  Vasculature: Unremarkable. No abdominal aortic aneurysm.  Lymph nodes: Unremarkable. No enlarged lymph nodes.    Bladder: Unremarkable as visualized.  Reproductive: Unremarkable as visualized.  Bones/joints: Unremarkable. No acute fracture.  Soft tissues:  Unremarkable.  IMPRESSION:  Small hiatal hernia.  Bilateral renal cysts.  No acute findings.  Colonic diverticulosis.    04/13/17 CT abd/pelvis with contrast:   IMPRESSION:  1. Small benign-appearing bilateral renal cysts.  2. Rectosigmoid diverticulosis.  3. 3 mm pulmonary nodule anterior right lung base. Recommend  followup as per Fleischner Society criteria.    01/02/14 CT abd/pelvis with IV contrast:    1. Colonic diverticulosis. Apparent mild circumferential wall thickening of    the mid sigmoid colon, which is favored to be artifactual, related to    nondistention. However, cannot fully exclude early acute sigmoid    diverticulitis or focal colitis. Recommend correlation with the patient's    clinical symptoms.   2. Echogenic foci with within the dependent gallbladder likely represent    gallstones. However, recommend followup with gallbladder ultrasound to fully    exclude the less likely possibility of a polypoid gallbladder lesion.   3. Tiny distal esophageal diverticulum.   4. Noncalcified 3 mm right middle lobe pulmonary nodule. Recommend comparison    with any prior CT examinations that are available. If none are available,    recommend followup according to Fleischner criteria.    Colonoscopy 10/21/14: report not available    EGD 12/23/2014: report not available      Assessment and Plan  Problem List:  # Abdominal pain  # Weight loss  # Diverticulosis    This is a 60 y.o. female with history of irritable bowel syndrome with constipation, diverticulitis, cholecystectomy (2019), depression, substance use and hysterectomy who is referred to gastroenterology for diverticulosis with abdominal pain.     Her outside gastroenterologist was not able to complete a colonoscopy in 2020 due to the severity of her diverticulosis. Subsequent colonoscopy with anesthesia here at [facility] 01/28/19 was able to be completed but with significant difficulty showing quite severe diverticulosis with endoscopic appearance suggestive of SCAD (however biopsies just showed mucosal prolapse, not inflammation).     Overall I do suspect that her lower abdominal symptoms are due to extreme sigmoid diverticulosis with intermittent partially obstructive symptoms. She was not able to tolerate oral mesalamine at 2.4g, but a trial of Uceris has led to significant improvement in her abdominal pain, bowel movements and appetite,  associated with regain of much of the weight she had previously lost. This suggests that there is a component of inflammation associated with the diverticulosis (diagnosis of SCAD) despite the biopsies not showing any inflammation..     The budesonide has resulted in significant improvement of her symptoms, PO intake and weight loss and has likely spared her from needing a sigmoid colectomy at this point. She was on Uceris for about 7 months and then was switched to Entocort at 44m, but this did not haave the same benefit and abdominal pains/bowwel habits worsened again and she started losing weight. She improved again wth increase of budesonide (Entocort) to [id number] still haging ongoing abdominal pains, small/thin stools. I will have her try daily 1.2g Lialda (did not tolerate 2.4g lialda) with hope of transitioning to this as a long term therapy, off of budesonide. If symptoms worsen significantly can re-discuss the possibility of sigmoid colectomy.     Summary of Recommendations:   -Continue Entocort at 63m40m-Start Lialda 1.2g daily  -Repeat phone call in 1 month with me

## 2020-06-03 MED ORDER — BUDESONIDE DR - ER 3 MG CAPSULE,DELAYED,EXTENDED RELEASE
3 | ORAL | 1 refills | Status: DC
Start: 2020-06-03 — End: 2020-07-26

## 2020-06-09 ENCOUNTER — Other Ambulatory Visit: Payer: Self-pay | Admitting: Internal Medicine

## 2020-06-09 DIAGNOSIS — R590 Localized enlarged lymph nodes: Secondary | ICD-10-CM

## 2020-06-14 MED ORDER — BUDESONIDE DR - ER 3 MG CAPSULE,DELAYED,EXTENDED RELEASE
3 | ORAL | 1 refills | 30.00000 days | Status: AC
Start: 2020-06-14 — End: 2021-03-30

## 2020-06-22 ENCOUNTER — Ambulatory Visit
Admission: RE | Admit: 2020-06-22 | Discharge: 2020-06-22 | Disposition: A | Discharge status: Home / Self Care | Payer: BC Managed Care – PPO | Source: Ambulatory Visit | Attending: Internal Medicine | Admitting: Internal Medicine

## 2020-06-22 ENCOUNTER — Other Ambulatory Visit: Payer: Self-pay

## 2020-06-22 DIAGNOSIS — R590 Localized enlarged lymph nodes: Secondary | ICD-10-CM | POA: Insufficient documentation

## 2020-07-01 NOTE — Patient Instructions (Addendum)
Call the [facility] at [phone] to schedule a swallowing test.

## 2020-07-01 NOTE — Progress Notes (Signed)
Chelsea Ball is a 60 y.o. female seen via telephone for a follow up visit  with chief complaint of Abdominal Pain   at the request of Derrill Center, DO.    The patient requested this visit. I obtained consent from the patient to conduct this visit by telephone only. I spent a total of 33 minutes in audio communication with this patient.      History of Present Illness  This is a 60 y.o. female with history of irritable bowel syndrome with constipation, diverticulitis, cholecystectomy (2019), depression, substance use and hysterectomy who is referred to gastroenterology for diverticulosis with abdominal pain and incomplete colonoscopy, following up after colonoscopy was able to be completed.     Background history:  Referred from Dr. Casimiro Needle in [place]. Consult question: "Patient with diverticulosis / diverticulitis (treated).[name] disorted lumen in splenic flexure and descending colon- Please evaluate for possible surgery?"    The patient describes her problem as having problems having a bowel movement. When she eats she has to eat some fruit to have a bowel movement and it can be runny. The patient reports weight loss because she gets severe stomach pain when she eats. The pain is happening on a daily basis. The entire stomach is painful, not one particular area- she feels she has to pant (breathe heavily) which can calm the pain a little bit. She reports that she has been put on bed rest by Dr. Terance Hart because of this. She has had weight loss related to eating less, she was 142 pounds, now down to 115 pounds. The only thing she can eat is soft foods, and reports she's not able to eat much. If she eats more she gets severe pain. She has some nausea but no vomiting. She reports being very weak now. She is also having rotator cuff problem. She reports having bowel movements that are runny - they used to be small, solid, curved, but now have transitioned to being runny. Some days she does not have a bowel  movement, and some days she will have a few runny bowel movements. She has some fear of eating. There is no blood in the stools.     She has not seen a nutritionist, does not take any nutrition shakes. She has stopped taking any of her medications because of the abdominal pain, uses Norco maybe once a week for rotator cuff, occasionally takes Calcium.    The patient reads me her medication list, although she is not curently taking these: Mirtazapine 15, megestrol 15, 8m hydroxyzine, 685mtizanadine PRN, calcium +vitamin D, docusate, fluoxetine, lamotrigine, albuterol, QVAR, Norco 5. She takes esomeprazole 4054ms needed for heartburn. She has tried FiberChoice tablets, didn't help her. Prior medicines from Dr. PetTerance Hartve her diarrhea. She tried miralax. She takes lactulose sometimes when constipated, helps her go. No enemas. Her prior medication list from June 2020 note of Dr. PetTerance Hartso mentions several PPI's, as well as metamucil, Creon, Amitiza, Bentyl, despiramine, Donnatal, fleet's enemas, but she reports prior medicines prescribed by Dr. PetTerance Hartst gave her diarrhea so she's not on them now.     She was seen in June 2020 by her gastroenterologist Dr. PetTerance Harto noted that he had attempted colonoscopy but was unable to advance past an area.     Her cholecystectomy did not help her pain in 2019.     Note, utox has been positive for cocaine metabolites on 05/29/18, 12/25/16, ad 01/25/16.     02/28/19 phone visit:  At  my initial clinic visit on 01/21/19, I planned to repeat colonoscopy with anesthesia. 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 I started the patient on mesalamine 2.4g daily which she did not tolerate after it causing sour stomach for a week.     At first she was having small bowel movements with postprandial abdominal pain. She is still  having thin stools and postprandial discomfort that begins even right when she begins to eat a meal that has caused some fear of eating larger meals. At our 02/28/19 visit she is a little better but not much. Now she reports walking more after being off bed rest and is having still thin bowel movements. She reports still not eating very well but thinks she has not lost further weight. She has not filled other medications with her PCP at this point. She is back on her psych meds. She takes opioids but only rarely, not every day.     05/21/19 visit:  At our 04/29/18 video visit, she was given a course of rifaximin with some improvement initially but symptoms returned. She was prescribed a course of Uceris but was not able to get this due to insurance for a couple of months.     At our 05/21/19 visit, she reports symptoms are getting worse. She has frequent small volume diarrhea, sometimes with urges to have a bowel movement but nothing comes out. When she does have bowel movments, it tends to be small bits or runny. She has had some episodes of incontinence. Some days she goes to the bathroom every 20 or 30 minutes and bowel movements are still very thin- bowel movements are like very long worms if it's not watery. Her abdominal pain is severe- reports it is worse than labor pains, intermittent spasms affecting the whole abdomen - she has to lay still and focus on her breathing to get the pain improve. It involves the whole abdomen and tends to happen when she is on the toilet trying to have a bowel movement. She continues to be afraid to eat because when she eats she usually has to go to the bathroom within 10-15 minutes for a bowel movement and then she gest the pain while on the toilet. She changes her body position (leaning backwards) sometimes helps a little stool come out. She continues to lose weight, she thinks now down to maybe 100 pounds although does not have a scale to confirm this.     Interval Events-  08/29/19 visit:   At our 05/21/19 visit I had referred the patient to colorectal surgery given the ongoing weight loss and severity of her symptoms. She then had the Uceris approved and started it June 20, 2019 and did experience some improvement in symptoms so surgery was not pursued at the time. She reported constipation and was prescribed Miralax in April 2021.     She is continuing to use Miralax with sometimes loose stools. Appetite is increased on the steroids. She feels a lot better on the Uceris, not miserable and waking up overnight anymore. She still has some episodes of wanting to poop and not being able to poop. She's eating moderately. She has 3 more weeks of Uceris left right now. Her weight has gone up, 113# up to 130.     Interval Events: 01/26/20  She had an ED visit 11/05/19 for abdominal pain. Mobic  was refilled and gabapentin prescribed.     Lower abd pain, sometimes wrapping around to the back. Pelvic ultrasound was normal. Has been having problems with bowel movements. Has not received budesonide this week. Entire lower abdomen. miralax works only fair. Budesonide was increasing appetite, but eating makes pain worse. Throughout day there is a periodic ache. bm's 1-2 times per day, but has to lean back and forth to have bm's, normally 3-4 small piees in the toilet. Symptoms tend to be relieved with bowel movements. Weight is around 142.     Interval events: 01/29/20:  01/29/20 plan was to take Uceris every other day for 3 months to see if it can be weaned, prescribed ongoing miralax and a bowel preparation in case needed. Referred for anorectal manometry/biofeedback but insurance reportedly does not cover biofeedback. Taking miralax every other day because daily use gives diarrhea. PCP tapering pain medications. Entocort 42m tabs prescribed    She's having problems with the budesonide. She is back to feeling how she was before starting steroids. Bowel habits have been loose and runny, back and  forth to the bathroom all day, with stomach pains. Appetite has been low from stomach hurting, very bad, weight is now going down again. This happened when budesonide changed from 9 to 3. Currently not taking miralax. No vomiting. Weight is now 134.     Interval Events 05/31/20:   Pain comes and goes, pants/breathes wih it. Happening 4-5 times per day. Pain is in the lower abdomen. Appetite increased a bit, weight coming back. Weight 141 now. Taking probiotic. Stools are smaller with some mucus. Stools are soft solids. Budesonide helps with the appetite and keeping weight up but not with the abdominal pains.     Interval events  Thinks mesalamine may be working. Weight is steady, 140. However still having lots of mucus frequently during the day and with bowel movements. Feels like it helps mucus come out. Bowel movements now formed and length of her finger or slightly longer. Takes 2 budesonides 359mwith mesalamine. She has questions about if ERCP is needed. Feels a problem with swallowing in the neck and throat, not in the chest.     Past Medical History:   Diagnosis Date    Abdominal pain 01/21/2019    Bipolar 1 disorder (CMS code) 01/21/2019    Depression 01/21/2019    GERD (gastroesophageal reflux disease) 05/04/2016      Past Surgical History:   Procedure Laterality Date    CHOLECYSTECTOMY      HYSTERECTOMY        Social History     Tobacco Use    Smoking status: Current Every Day Smoker     Years: 8.00    Smokeless tobacco: Never Used    Tobacco comment: 3 cigarettes/day   Substance and Sexual Activity    Alcohol use: Yes     Comment: holidays only    Drug use: Not Currently     Comment: cocaine 7 years ago    Sexual activity: Not on file   Social History Narrative    Not on file     Family history:  Father and grandmother with colon cancer, also uncles with colon cancer. Father was in his 505'shen he got colon cancer.     Medications:   See HPI    Allergies/Contraindications  No Known Allergies      Social:  3 cigarettes per day  Very rare alcohol  Reports not having used drugs for years  Review of Systems  Gen: No fevers/chills, reports no weight loss since last visit  Cardiovascular: No chest pain, no syncope  All other systems were reviewed and are negative.     Physical Exam:  No physical exam due to telephone visit    I have personally reviewed the labs, imaging and other diagnostics below:     Latest [facility]  No results found for: HGB, WBC, MCV, PLT, INR, AST, ALT, TBILI, ALKP, ESR, CRP    Latest Outside Labs  01/04/18 Hgb 13.8, Plt 277, WBC 3.4  Calcium 10.1  Lipase 25  HIV negative  TSH 1.33    05/29/18   Utox positive for cocaine metabolites  Hgb 12.0, Plt 229  TSH 3.34    Latest Imaging and Other Diagnostics  01/28/19 colonoscopy ([place] [facility])  IMPRESSIONS:   1. Two sessile polyps were found in the ascending   colon and at the cecum; polypectomy was performed with cold forceps   2. 4 mm sessile polyp was found in the sigmoid colon; polypectomy was   performed using a cold snare   3. Two sessile polyps were found in the rectum; polypectomy was   performed with a cold snare   4. There was severe diverticulosis in the sigmoid colon, ranging from   18cm to 30cm from the anal verge (measured on withdrawal with the   colon straight and short). 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 This was biopsied for   histology   5. Retroflexed views revealed no abnormalities     FINAL PATHOLOGIC DIAGNOSIS  A. Right colon, polyps, biopsy: Tubular adenoma.       B. Sigmoid polyp, biopsy: Sessile serrated adenoma.  C. Sigmoid, biopsy: Mucosal prolapse.   D. Rectal polyps, biopsy: Sessile serrated adenoma, fragments.       07/23/18 Colonoscopy:  Indication: Abdominal pain (Fentanyl 150 total, Versed 4 total)  Comments: Unable to pass distal transverse colon, diverticulosis noted.     07/05/18  Abdominal ultrasound:   Impression: Fatty infiltration of liver, but without evidence of hepatomegaly, splenomegaly, portal hypertension, focal solid mass lesion, biliary ductal dilatation, ascites nor varices.   S/p cholecystectomy without biliary dilatation or choledocholithiasis  Simple right renal cyst    EGD 08/05/18  Impression: Acute gastritis without bleeding  Pathology:  Antrum biopsy: Mild reactive gastropathy. No intestinal metplasia, H pylori, or neoplasia.    CT abdomen/pelvis with contrast 01/06/19:   Impression:   S/p cholecystectomy clips without evidence of biliary ductal dilatation or choledocholithiasis seen  S/p hysterectomy findings  Fatty infiltration of liver  Bilateral simple renal cysts, largest of which measures 11 x 16 x 63m in mid lower pole junction region of right kidney    01/04/18 CT abdomen/pelvis with IV contrast: (abdominal pain):  FINDINGS:  Mediastinum: A small hiatal hernia is present.    Liver: A small amount of focal fatty infiltration is seen in the liver adjacent  to the falciform ligament. No suspicious masses are seen within the liver.  Gallbladder and bile ducts: The gallbladder has been removed.  Pancreas: Normal. No ductal dilation.  Spleen: Normal. No splenomegaly.  Adrenals: Normal. No mass.  Kidneys and ureters: The right kidney demonstrates a simple cyst in the lower  pole measuring 1.6 cm in diameter. The left kidney demonstrates a simple cyst  in the lower pole measuring 6 mm in diameter. There is  no hydronephrosis. No  renal stones are identified.  Stomach and bowel: Unremarkable. No obstruction. No mucosal thickening.  Appendix: No evidence of appendicitis.  Intraperitoneal space: Unremarkable. No free air. No significant fluid  collection.  Vasculature: Unremarkable. No abdominal aortic aneurysm.  Lymph nodes: Unremarkable. No enlarged lymph nodes.    Bladder: Unremarkable as visualized.  Reproductive: Unremarkable as visualized.  Bones/joints: Unremarkable. No  acute fracture.  Soft tissues: Unremarkable.  IMPRESSION:  Small hiatal hernia.  Bilateral renal cysts.  No acute findings.  Colonic diverticulosis.    04/13/17 CT abd/pelvis with contrast:   IMPRESSION:  1. Small benign-appearing bilateral renal cysts.  2. Rectosigmoid diverticulosis.  3. 3 mm pulmonary nodule anterior right lung base. Recommend  followup as per Fleischner Society criteria.    01/02/14 CT abd/pelvis with IV contrast:   1. Colonic diverticulosis. Apparent mild circumferential wall thickening of    the mid sigmoid colon, which is favored to be artifactual, related to    nondistention. However, cannot fully exclude early acute sigmoid    diverticulitis or focal colitis. Recommend correlation with the patient's    clinical symptoms.   2. Echogenic foci with within the dependent gallbladder likely represent    gallstones. However, recommend followup with gallbladder ultrasound to fully    exclude the less likely possibility of a polypoid gallbladder lesion.   3. Tiny distal esophageal diverticulum.   4. Noncalcified 3 mm right middle lobe pulmonary nodule. Recommend comparison    with any prior CT examinations that are available. If none are available,    recommend followup according to Fleischner criteria.    Colonoscopy 10/21/14: report not available    EGD 12/23/2014: report not available      Assessment and Plan  Problem List:  # Abdominal pain  # Weight loss  # Diverticulosis    This is a 60 y.o. female with history of irritable bowel syndrome with constipation, diverticulitis, cholecystectomy (2019), depression, substance use and hysterectomy who is referred to gastroenterology for diverticulosis with abdominal pain.     Her outside gastroenterologist was not able to complete a colonoscopy in 2020 due to the severity of her diverticulosis. Subsequent colonoscopy with anesthesia here at [facility] 01/28/19 was able to be completed but with significant difficulty showing quite severe  diverticulosis with endoscopic appearance suggestive of SCAD (however biopsies just showed mucosal prolapse, not inflammation).     Overall I do suspect that her lower abdominal symptoms are due to extreme sigmoid diverticulosis with intermittent partially obstructive symptoms. She was not able to tolerate oral mesalamine at 2.4g, but a trial of Uceris has led to significant improvement in her abdominal pain, bowel movements and appetite, associated with regain of much of the weight she had previously lost. This suggests that there is a component of inflammation associated with the diverticulosis (diagnosis of SCAD) despite the biopsies not showing any inflammation..     The budesonide has resulted in significant improvement of her symptoms, PO intake and weight loss and has likely spared her from needing a sigmoid colectomy at this point. She was on Uceris for about 7 months and then was switched to Entocort at 49m, but this did not haave the same benefit and abdominal pains/bowwel habits worsened again and she started losing weight. She improved again wth increase of budesonide (Entocort) to 656mbut still haging ongoing abdominal pains, small/thin stools. Lialda 1.2g was added and she feels possibly some improvement but lots of mucus with stools. Will continue this treatment for  now given her stable weight and possible mild improvement with Lialda. This may help facilitate gradually lowering the budesonide again in the future. She does express some interest in re-discussing surgery given ongoing symptoms but wants to have swallowing evaluated first. Swallowing symptoms are localized to the throat so will refer for modified barium swallow /swallow therapy eval.       Summary of Recommendations:   -Continue Entocort at 62m  -Continue Lialda 1.2g daily  -Plan to contact patient after swallow study is completed

## 2020-07-19 NOTE — Telephone Encounter (Signed)
Gastroenterology Clinic Telephone Contact Note    Called Chelsea Ball to follow up on episode of blood with stool last night, see note from Jill Poling for details, patient noted some blood and peach colored material in underwear last night, and next bowel movement was brown but with some blood appearing material that sank to the bottom of the toilet; no bowel movement today.     My top suspicion is for irritation/inflammation from diverticula leading to bleeding from superficial erosion; pace does not sound like a true arterial diverticular bleed but I warned the patient that if another bowel movemnet happens with as much or more blood to go tot he emergency department to be safe, or if develops overall weakness, dizziness, worsened abdominal pain. Will plan to check in on patient again. Will have my clinic schedule for colonoscopy.       Percell Boston MD  Assistant Professor  Gastroenterology

## 2020-07-19 NOTE — Telephone Encounter (Signed)
Called pt to assess symptoms.   Pt  had 2 BMs last night. She saw some bright red blood on her underwear and toilet and some blood was mixed with stool.   She said it was more than table spoon.   She has no BM today so NO bleeding noticed yet.   Pt reported stomach pain on & off, up to 8 out of 10  Pt reported poor appetite, doesn't eat much but did not like to say how much exactly she eats daily  She lives alone and  is able to manage her activities.  She denied dizziness, N&V    Instructed pt to go ER if excessive bleeding, or the following symptoms occurs, or if she doesn't sure how much she is bleeding, she has to ER to be evaluated.      GI bleeding symptoms  1. black or tarry stool.  2.   bright red blood in vomit.  3.   cramps in the abdomen.  4.   dark or bright red blood mixed with stool.  5.   dizziness or faintness.  6.   feeling tired.  7.   paleness.  8.   shortness of breath.    Advised pt to  call me for any question or report any non life threaten symptoms.       Pt was extremely frustrated and emotional. She said she was suffering from anxiety. She apologized at the end for her frustration and rude attitude.        Ivory Broad, MD  Rolland Bimler, RN  Hi Larita Fife,   Can you call patient to find out details of blood in stool?   -If multiple high volume blood in stool and/or lightheaded/dizzy then go to ER   -If smaller amount of blood in stool and it is new, I advise scheduling a colonoscopy with anesthesia support with me at any site   If bleeding goes away in next day or two and does not happen again it would be ok to cancel the colonoscopy   Thanks   Jesusita Oka

## 2020-07-20 NOTE — Telephone Encounter (Signed)
Gastroenterology Clinic Telephone Contact Note    Called ms. Degante to follow up on report of blood in stool 2 nights ago. She tells me she had a bowel movement today with no red material in it. Advised to continue monitoring and let us know if it recurs. Will ask our clinic to set her up for colonoscopy.         Percell Boston MD  Assistant Professor  Gastroenterology

## 2020-07-26 MED ORDER — BUDESONIDE DR - ER 3 MG CAPSULE,DELAYED,EXTENDED RELEASE
3 | ORAL | 1 refills | Status: AC
Start: 2020-07-26 — End: ?

## 2020-08-01 MED ORDER — MESALAMINE 1.2 GRAM TABLET,DELAYED RELEASE
1.2 | ORAL | 1 refills | 60.00000 days | Status: AC
Start: 2020-08-01 — End: 2020-09-17

## 2020-08-16 MED ORDER — METRONIDAZOLE 500 MG TABLET
500 | ORAL_TABLET | Freq: Two times a day (BID) | ORAL | 0 refills | Status: AC
Start: 2020-08-16 — End: 2020-08-23

## 2020-08-16 NOTE — Telephone Encounter (Addendum)
Called pt to assess symptoms  Pt reported she has entire lower abdominal pain and radiated  to her back 7 out of 10. She took Miralax 8 oz instead of 2 oz yesterday per patient for constipation. Then she felt loose or watery stool 6-7 times today with abdominal pain. Pt denied nausea & vomitting, and bleeding.   Abdomen was soft to touch per patient. Able to tolerate food.   Patient was able to manage her routine activities while she felt pain.     Patient was asking the address for her next radiology appointment and information was provided.     Instructed to stop taking Miralax and continue to monitor diarrhea. Instructed to go to urgent care if pain doesn't resolved or gets worse after stopping Miralax.   Instructed to keep [place] updated.

## 2020-08-16 NOTE — Telephone Encounter (Signed)
Gastroenterology Clinic Telephone Contact Note    She had been getting constipated so took extra miralx. Had to rock back and forth to have bowel movemnts. Stool hs a different smell, but no blood. Stool now liquid after miralax. Has nausea but no vomiting. Got a little better after passing some of the stools. Still taking budesonide and mesalamine.     With change in stool, change in smell, and looser stool, I am concerned for possibly overlying small intestinal bacterial overgrowth. I will prescribe a course of metronidazole twice daily for a week for empiric treatment, advised her of possible side effects and to avoid alcohol during that time.     Percell Boston MD  Assistant Professor  Gastroenterology

## 2020-08-24 ENCOUNTER — Ambulatory Visit: Admit: 2020-08-24 | Payer: MEDICAID | Attending: Speech-Language Pathologist | Primary: Physician

## 2020-08-24 ENCOUNTER — Inpatient Hospital Stay: Admit: 2020-08-24 | Discharge: 2020-09-20 | Payer: MEDICAID | Primary: Physician

## 2020-08-24 DIAGNOSIS — R1310 Dysphagia, unspecified: Secondary | ICD-10-CM

## 2020-08-24 NOTE — Progress Notes (Signed)
[facility] Department of Otolaryngology-Head and Neck Surgery  Speech Language Pathology  Clinical Swallow Evaluation & Videofluoroscopic Swallow Study    History: Chelsea Ball, is a 60 y.o. female, who was seen today at the request of Dr. Edwin Cap to comprehensively assess the anatomy and physiology of the swallowing mechanism, examine effectiveness of compensatory strategies, and evaluate the need for additional intervention. The patient has a medical history of irritable bowel syndrome with constipation, diverticulitis, cholecystectomy (2019), depression, substance use and hysterectomy.    The patient reports chronic gastric problems including bloating, gas, and abdominal pain. She reports choking on liquids and solids throughout the daily. She describes food and liquids will go down her throat before she is ready to swallow. This will cause her to cough/choke/gag and then urinate d/t her bladder problems. She reports reflux and regurgitation of food and liquids. She has to eat smaller portions to reduce likelihood of regurgitation. She endorses food will sometimes stick in her lower throat. She chews more thoroughly to minimize this from occurring. She reports she has had swallowing problems for many years but has worsened over the last couple of years. She limits portions to avoid stomach pain. She is eating a regular diet, but eats primarily softer foods so that she does not have to chew as thoroughly. She has had weight loss which she attributes to her diverticulitis and gastric problems. She dropped from 155lbs to 117lbs. She has gained back to 144lbs. No PNAs. No previous swallow evaluation.     She reports voice has always been "deeper." Denies dysphonia.     Current Diet Per Patient Report/International Dysphagia Diet Standardization Initiative (IDDSI Level)  Liquids (per patient): 0 Thin  Solids (per patient): 7a Easy to Chew    Functional Oral Intake Scale (FOIS) per pt report   Level 6: Total oral diet  with multiple consistencies without special preparation, but with specific food limitations    Perceptual Voice and Speech Evaluation   Voice quality: strained: mild; low pitch   Resonance: posterior focused resonance: mild   Articulation: WNL   Speech intelligibility: 100%    Head and Neck Exam Findings    Cervical ROM: Not assessed.   Face:unremarkable   Jaw: unremarkable   Dentition: full set dentures   Lips:unremarkable   Tongue: unremarkable   Palate: unremarkable   Sensory Findings: Not assessed.   Other pertinent findings: none    Objective Oral-Motor and Head & Neck Examinations were performed to objectively assess cervical range of motion, mandibular range of motion, lingual strength and range of motion, and salivary production, revealing:    Most Recent Value   Orofacial Strength Measurements (using IOPI)    Lingual - Anterior Tongue Isometric Compression: 60 kPA   Lingual - Anterior Tongue Isometric Compression Norms: Normal for 58-63 years old (>49 kPa)   Lingual - Anterior Tongue Isokinetic Swallow: 31 kPA   Lingual - Anterior Tongue Isokinetic Swallow Norms: Normal for 9-58 years old (>18 kPa)          Clinical Swallow Evaluation was completed with thin liquids revealing:   Anterior oral bolus containment:complete   Oral bolus clearance: complete   Upon throat palpation laryngeal ascent during perceived swallowing gesture: present   Outcome during or after trials: no overt symptoms or signs of aspiration or respiratory distress   Other pertinent findings: none    Dynamic Swallow Study: A penny was used taped to the patients neck and used as a 19.05 mm calibration measurement marker.  The patient was presented with nectar thick liquid single barium sips for Dynamic Swallow Study performance and interpretation to objectively quantify swallowing kinematics, to compare to normative data, and to assess aspiration risk. Please see "Swallowing Evaluation" in the attached flowsheet for  results.  Swallow Evaluation Summary        Some values may be hidden. Unless noted otherwise, only the newest values recorded on each date are displayed.         Swallow Evaluation Summary 08/24/20   PA-hold (cm^2) 2.02542 cm^2   PA-hold Norm Normal (<8.2 cm2)   PA-max (cm^2) 7.06237 cm^2   PA-max Norm 1-1.5 SD (0.34-0.42 cm2)   PCR 0.05   PCR Norm Normal (<0.06 cm2)   PES-max (mm): 10.06 mm   PES-max Norm Normal (>6.23 mm)   HL-hold (mm)  40.36 mm   HL-hold Norm 1.5-2 SD (38.3-41.3 mm)   HL-approx (mm) 30.44 mm   HL (mm): 9.92 mm   HL Norm Normal (>5.2 mm)   Hant-max (not calibrated) 146 mm   Hsup-max (not calibrated) 142 mm   Hant-rest (mm) 38.25 mm   Hant-max (mm) 45.48 mm   Hant (mm) 7.23 mm   H-anterior Norm Normal (>2.85 mm)   Hsup-rest (mm) 35.54 mm   Hsup-max (mm)  44.24 mm   Hsup (mm) 8.7 mm   H-superior Norm 1-1.5 SD (11.35-8.38 mm)   Hmax (mm): 11.31   H-Max Norm Normal (>10.08 mm)   HL+Hmax (mm) 21.23 mm   HL + Hmax Norm 1.5-2 SD (22.3-20.2 mm)             Videofluoroscopic Swallow Study was performed to assess swallowing kinematics. Patient was presented with Varibar thin liquid, Varibar mildly thick, Varibar paste, graham cracker  and tablet + thin liquid.  Visualization was assessed in the lateral and A-P planes.    Structural Abnormalities   Absent  Oral Preparatory Phase    Component 1 Lip Closure No labial escape    Component 2 Tongue Control During Bolus Hold Cohesive bolus between tongue to palatal seal   Component 3 Bolus Preparation/Mastication Timely and efficient chewing and mashing   Oral Propulsive Phase    Component 4 Bolus Transport/Lingual Motion Brisk tongue motion   Component 5 Oral Residue   Complete oral clearance     Component 6 Initiation of the Pharyngeal Swallow Bolus head at posterior angle of ramus (first hyoid excursion)   Oral Phase additional comments: na    Pharyngeal Phase   Component 7 Soft Palate Elevation No bolus between soft palate (SP)/pharyngeal wall (PW)      Component 8 Laryngeal Elevation & Arytenoid-Epiglottic Approximation: Full superior movement of thyroid cartilage with approximation of arytenoids to epiglottic petiole   Component 9 Anterior Hyoid Excursion: Complete anterior movement   Component 10 Epiglottic Inversion: Full inversion   Component 11 Laryngeal Vestibular Closure:  Complete: no air/contrast in laryngeal vestibule     Component 12 Pharyngeal Stripping wave Present-complete   Component 13 Pharyngeal Contraction   Complete   Component 14 Pharyngoesophageal Segment (PES) Opening Complete distension and full duration; no obstruction of flow   Component 15 Tongue Base Retraction No bolus between TB and posterior pharyngeal wall (PW)   Component 16 Pharyngeal Residue   Complete pharyngeal clearance     Pharyngeal Phase additional comments: na   Esophageal Phase   Component 17 Esophageal Clearance Upright Position Complete clearance; esophageal coating   Esophageal Phase additional comments: The patient had to expectorate following sequential swallows  of liquid.      Trialed Compensatory Strategies   Postures: N/A   Maneuvers: N/A   Other: N/A  Rosenbeck Penetration-Aspiration Scale    1 - materal does not enter airway (worst swallow without strategies)    DIGEST  Dynamic Imaging Grade of Swallowing  Hutchinson et al. Cancer. (2017)    DIGEST-CTCAE Safety Efficiency   Grade 0 - No toxicity     S0 - Safe   E0 - Efficient         Counseling & Reccommendations   Introduced the anatomy and physiology of normal speech and swallow function and provided education on patients current swallow function    Impressions: Pecola Haxton, 60 y.o. female with a medical history of irritable bowel syndrome with constipation, diverticulitis, cholecystectomy (2019), depression, substance use and hysterectomy presents with dysphagia. The results of this evaluation reveal a safe and efficient oropharyngeal swallow. No penetration, aspiration, or pharyngeal residue  appreciated. The patient did have an episode of regurgitation/expectoration following sequential swallows of thin liquids which she notes as consistent with her symptoms. Her symptoms appear more suggestive of known gastrointestinal history vs oropharyngeal dysphagia. Will defer to referring provider regarding indication for esophageal work-up.     Recommended Diet/International Dysphagia Diet Standardization Initiative (IDDSI Level):  Liquids: 0 Thin  Solids: 7a Easy to Chew    Recommended Functional Oral Intake Scale (FOIS):   Level 6: Total oral diet with multiple consistencies without special preparation, but with specific food limitations    Recommended Compensatory strategies:    N/A    Plan:  - The patient will follow-up with referring provider as scheduled  - SLP available PRN    The results and recommendations of this evaluation were explained at length with the patient and she is in agreement with the plan.     Recommended additional work up:   none   Comment: none    Peggye Fothergill, SLP  Speech-Language Pathologist

## 2020-09-02 MED ORDER — ONDANSETRON HCL 8 MG TABLET
8 | ORAL_TABLET | ORAL | 0 refills | Status: AC
Start: 2020-09-02 — End: ?

## 2020-09-02 MED ORDER — SIMETHICONE 125 MG CHEWABLE TABLET
125 | ORAL_TABLET | ORAL | 0 refills | Status: AC
Start: 2020-09-02 — End: ?

## 2020-09-02 MED ORDER — PEG 3350-ELECTROLYTES 236 GRAM-22.74 GRAM-6.74 GRAM-5.86 GRAM SOLUTION
Freq: Once | ORAL | 0 refills | Status: AC
Start: 2020-09-02 — End: 2020-09-02

## 2020-09-10 ENCOUNTER — Ambulatory Visit: Payer: MEDICAID | Primary: Physician

## 2020-09-10 DIAGNOSIS — Z01818 Encounter for other preprocedural examination: Secondary | ICD-10-CM

## 2020-09-10 MED ORDER — HYDROCODONE 5 MG-ACETAMINOPHEN 325 MG TABLET
5-325 | ORAL | Status: AC
Start: 2020-09-10 — End: ?

## 2020-09-10 MED ORDER — PREGABALIN 75 MG CAPSULE
75 mg | Freq: Every day | ORAL | Status: AC | PRN
Start: 2020-09-10 — End: ?

## 2020-09-10 MED ORDER — CONJ ESTROGEN-MEDROXYPROGESTERONE 0.625 MG-2.5 MG TABLET
0.625-2.5 | Freq: Every day | ORAL | 1.00 refills | Status: AC
Start: 2020-09-10 — End: 2022-07-22

## 2020-09-10 MED ORDER — LORATADINE 10 MG TABLET
10 mg | Freq: Every day | ORAL | Status: AC
Start: 2020-09-10 — End: ?

## 2020-09-10 MED ORDER — QVAR REDIHALER 80 MCG/ACTUATION HFA BREATH ACTIVATED AEROSOL
80 | RESPIRATORY_TRACT | Status: AC
Start: 2020-09-10 — End: ?

## 2020-09-10 MED ORDER — PROBIOTIC ORAL
ORAL | Status: AC
Start: 2020-09-10 — End: ?

## 2020-09-10 NOTE — Patient Instructions (Signed)
We want your upcoming surgical visit to be as safe and comfortable as possible. The following instructions are designed to prepare you for your surgical hospitalization.  Please read and follow all instructions carefully.    Procedure Location  Your procedure is scheduled to take place at [facility], [address] 1st floor Children[facility] Admissions. Phone [phone]    Procedure Date and Time  Please arrive on 09/14/2020.       Contact your surgeon's office if you have not received your time of arrival for the day surgery    If for any reason your surgery start time is changed, you will receive a call from your surgeons office.  Should you have any questions regarding the date or time of arrival, please call your surgeons office and ask to speak with his/her practice assistant.    Preparing for Surgery  What can I eat or drink on the day of surgery?  Please do not have anything to eat or drink except clear liquids after midnight the evening before your surgery (including gum, candy or mints).  You may have clear liquids on the day of surgery up to 2 hours prior to arrival.  Clear liquids include:  Non-pulp, clear apple juice.  Tea with sugar or sweetener (NO milk, cream, or milk substitute)  Gatorade  Water  If you drink anything other than clear liquids on the day of surgery, or if you have ANYTHING to drink in the two hours prior to hospital arrival, then your doctors will cancel your surgery for your safety.  If you have been instructed to take any medications the day of surgery, take them with a small sip of water.    If your surgeon has given you additional instructions about what to eat or drink before the day of surgery, please follow those instructions as well.    Which medications should I take on the day of surgery?     Medication List            Accurate as of September 10, 2020  8:58 AM. Always use your most recent med list.                Medication  Information        Take Morning of Surgery Special Instructions Other   albuterol 90 mcg/actuation metered dose inhaler  Inhale into the lungs every 6 (six) hours as needed for Wheezing  Commonly known as: PROVENTIL HFA;VENTOLIN HFA     As needed         * budesonide 3 mg 24 hr capsule  TAKE 1 CAPSULE BY MOUTH EVERY DAY IN THE MORNING  Commonly known as: ENTOCORT EC     Take medication the day of surgery                 calcium carbonate-vitamin D 600 mg-10 mcg (400 unit) tablet  TAKE 1 TABLET BY MOUTH EVERY DAY     Do not take medication the day of surgery           esomeprazole 40 mg capsule  Take 40 mg by mouth daily as needed  Commonly known as: NEXIUM     Take medication the day of surgery           estrogen (conjugated)-medroxyPROGESTERone 0.625-2.5 mg tablet  Take 1 tablet by mouth daily  Commonly known as: PREMPRO     Take medication the day of surgery  HYDROcodone-acetaminophen 5-325 mg tablet  Take 1 tablet by mouth  Commonly known as: NORCO     As needed         hydrOXYzine 25 mg tablet  Take 25 mg by mouth every 6 (six) hours as needed  Commonly known as: ATARAX     As needed         lamoTRIgine 100 mg tablet  Take by mouth daily  Commonly known as: LaMICtal     Take medication the day of surgery           loratadine 10 mg tablet  Take 10 mg by mouth daily  Commonly known as: CLARITIN     Take medication the day of surgery           mesalamine 1.2 gram EC tablet  TAKE 1 TABLET BY MOUTH DAILY  Commonly known as: LIALDA     Take medication the day of surgery                       * ondansetron 8 mg tablet  Take 8 mg po every 6-8 hours as needed for nausea related to colonoscopy prep.   To be taken as needed for nausea ( see colonoscopy prep instructions for details)  Commonly known as: ZOFRAN     As needed                     polyethylene glycol 17 gram/dose powder  Take 17 g by mouth daily  Commonly known as: GLYCOLAX     Do not take medication the day of surgery           pregabalin 75 mg  capsule  Take 75 mg by mouth daily as needed  Commonly known as: LYRICA     As needed         PROBIOTIC ORAL  Take by mouth daily     Do not take medication the day of surgery           QVAR REDIHALER 80 mcg/actuation breath activated inhaler  TAKE 2 PUFFS BY MOUTH TWICE A DAY  Generic drug: beclomethasone     Take medication the day of surgery     Take medication the night before surgery as usual        simethicone 125 mg chewable tablet  PLEASE PURCHASE OVER THE COUNTER. PLEASE TAKE 3 TABLETS (approx. 400 mg) THE NIGHT BEFORE THE PROCEDURE and 3 TABLETS (approx. 400 mg) THE MORNING OF WHILE DRINKING THE BOWEL PREP.  Commonly known as: MYLICON     Per colonoscopy prep instructions                      Preparing to Come to the [facility]  Showering Instructions:  Shower the morning of surgery. After showering, do not apply lotion, cream, powder, deodorant, or hair conditioner.   Do not shave or remove body hair. Shaving your face is generally fine. If you are having head surgery, however, ask your doctor whether you can shave.      Wear casual, loose fitting, comfortable clothing and leave all valuables, including jewelry, and large sums of cash at home.  Leave your valuables at home. Belongings that remain with you are your responsibility. [facility] is not liable for loss or damage. If valuables are brought to the hospital, they will be identified and recorded by our admitting team. All jewelry must be removed and left at home.  If rings cannot be removed, we encourage you to have them removed by a jeweler prior to surgery to avoid damage or loss of ring.  [facility] is not responsible of lose or damaged jewelry. This policy protects the patient and prevents the items from being lost or damaged.   Leave contact lenses at home. Wear your eyeglasses and bring a case.  If you develop any illness prior to surgery (fever, cough, sore throat, cold, flu, infection), OR START A NEW MEDICATION, please call your surgeon and the [facility] at [phone].  If you are spending the night you may bring toiletries and sleeping clothes if you desire; otherwise the hospital will provide them for you.   DO NOT bring your medications with you to the hospital unless you were specifically instructed to do so.  DO bring a list of your medications including dose(s) and when you take them.  DO bring TWO forms of ID - including one ID with a photo.    Leaving the Hospital  Please ask your surgeon about your anticipated length of stay.  Please make sure your ride is available to pick you up by 12N on day of discharge  It is recommended that all patients have a responsible person at home the first night after discharge from the hospital.  ALL patients, including same day surgery patients, must arrange for an adult to drive/escort them home upon discharge.  Patients going home the same day of surgery will have their procedure cancelled if these arrangements are not made ahead of time.    Family and Friends  Friends and family may wait in the assigned waiting areas.  Patient care coordinators are available in these patient waiting areas to provide updates regarding patient progress; hospital room assignments; and discharge planning (for same day surgery).    [facility], [address], 2nd floor Children's surgery     Guided Imagery  Research has shown that listening to guided imagery is helpful for many health conditions.  [facility] offers these sessions to listen to at the following website: [email or web address]-[email or web address]     Please consider guided imagery to prepare for surgery and for coping with stress, sleep and pain.      COVID Screening On-Demand  Screening for COVID is required for all patients and visitors arriving to a [facility] facility. To expedite the screening process, you are welcome to utilize the  screening on-demand tool. Please follow the link from your smartphone or  scan the QR code to open the screening tool on the day of your arrival and answer the screening questions prior to your arrival.     On-demand COVID screening: [email or web address]      Temporary Visitor Restrictions During the COVID-19 Emergency    Wed like to provide advance notice of additional protections our hospital has temporarily put in place for the safety of our patients and visitors, including you, your loved ones, and our healthcare providers.    Visitors are restricted from the hospital and our surgical waiting area will be closed to non-patients.  If you are not going home the day of surgery, we ask that your family members/visitors stay at home.  If you are going home the day of your surgery, your family member or person who will drive you home will be unable to accompany you to the preop / pacu area.  We strongly encourage your family member/driver to practice social distancing in the location they chose to  wait.  If the driver lives in [place], we encourage them to return home to wait.      Patients will need to undergo a health screening on arrival and will not be allowed if they have symptoms of illness, including cough, runny nose, sneezing, fever, and sore throat.  Additionally, children under the age of 53 are restricted from the hospital.    Thank you for your cooperation.    [facility], Radiation Oncology Procedure Room Visitor Policy:    Patients are allowed one guest to accompany them to Radiation Oncology but must wait in our main waiting room, [facility] lobby, or Cancer Resource Center (or leave and return for patient pick-up)  Guests are not allowed to be in the procedure suites or patient holding bays  Our staff will be contacting the guest/caregiver regarding patients' status and pick-up information     FAQ     1.     What if I or the person who is scheduled to take me home following my procedure has one of the symptoms you stated?  Please attempt to secure a  ride home with a different friend or family member. If thats not possible, please contact your surgeons office immediately to inquire whether or not it is appropriate to reschedule the procedure.     2.     Why are children not allowed?  This is a precaution put in place by our clinical leadership and intended to ensure the safety of our patients. Younger children can carry viruses even when they are not symptomatic.

## 2020-09-10 NOTE — Anesthesia Pre-Procedure Evaluation (Addendum)
[facility]  Anesthesia Preprocedure Evaluation    Procedure Information     Case: [id number] Date/Time: 09/14/20 1110    Procedure: ENDO ADULT COLONOSCOPY WITH BIOPSY (N/A )    Diagnosis: Segmental colitis associated with diverticulosis (CMS code) [K50.10, K57.30]    Pre-op diagnosis: Segmental colitis associated with diverticulosis (CMS code) [K50.10, K57.30]    Location: [facility] ADULT PROCEDURE RM 27 / [facility]    Surgeons: Ivory Broad, MD          Precautions          None          Relevant Problems   No relevant active problems     Patient Active Problem List    Diagnosis Date Noted    Segmental colitis associated with diverticulosis (CMS code) 06/17/2019     Added automatically from request for surgery [id number]      History of colonoscopy 02/02/2019     01/2019 colonoscopy with one tubular adenoma and two sessile serrated adenomas; recommend repeat colonoscopy in 3 years.       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 Last addressed on 12/18/2012. Problem automatically mapped to SNOMED code "Eczema (84665993)" from [facility] Chronic  Conditions table on 12/20/2013.      Gastroduodenitis 06/23/2011     Converted note: Onset date 06/23/2011. Last addressed on 03/29/2012. Problem automatically mapped to SNOMED code "Gastroduodenitis (570177939)" from [facility] Chronic Conditions table on 12/20/2013.      Moderate persistent asthma 04/10/2011     Converted note: Onset date 04/10/2011. Last addressed on 05/30/2013. Problem automatically mapped to SNOMED code "Moderate persistent asthma (030092330)" from [facility] Chronic Conditions table on 12/20/2013.      Back pain 06/22/2006      Past Medical History:   Diagnosis Date    Abdominal pain 01/21/2019    Bipolar 1 disorder (CMS code) 01/21/2019    Depression 01/21/2019    GERD (gastroesophageal reflux disease) 05/04/2016     Past Surgical History:   Procedure Laterality Date    CHOLECYSTECTOMY  04/26/2017    HYSTERECTOMY      right open carpal tunnel release (Right Wrist)  05/15/2018     Current Medications       Dosage    beclomethasone (QVAR REDIHALER) 80 mcg/actuation breath activated inhaler TAKE 2 PUFFS BY MOUTH TWICE A DAY    calcium carbonate-vitamin D 600 mg(1,500mg ) -400 unit tablet TAKE 1 TABLET BY MOUTH EVERY DAY    esomeprazole (NEXIUM) 40 mg capsule Take 40 mg by mouth daily as needed  HYDROcodone-acetaminophen (NORCO) 5-325 mg tablet Take 1 tablet by mouth    Lactobacillus acidophilus (PROBIOTIC ORAL) Take by mouth daily    loratadine (CLARITIN) 10 mg tablet Take 10 mg by mouth daily    mesalamine (LIALDA) 1.2 gram EC tablet TAKE 1 TABLET BY MOUTH DAILY    polyethylene glycol (GLYCOLAX) 17 gram/dose powder Take 17 g by mouth daily    pregabalin (LYRICA) 75 mg capsule Take 75 mg by mouth daily as needed    albuterol 90 mcg/actuation metered dose inhaler Inhale into the lungs every 6 (six) hours as needed for Wheezing    budesonide (ENTOCORT EC) 3 mg 24 hr capsule TAKE 1 CAPSULE BY MOUTH EVERY DAY IN THE MORNING    budesonide (ENTOCORT EC) 3 mg 24 hr capsule TAKE 2 CAPSULES BY MOUTH EVERY  MORNING    estrogen, conjugated,-medroxyPROGESTERone (PREMPRO) 0.625-2.5 mg tablet Take 1 tablet by mouth daily    hydrOXYzine (ATARAX) 25 mg tablet Take 25 mg by mouth every 6 (six) hours as needed    lamoTRIgine (LAMICTAL) 100 mg tablet Take by mouth daily    metroNIDAZOLE (FLAGYL) 500 mg tablet Take 2 tablet (1000 mg) 3 times at 1 PM,  3 PM and 10 PM the day prior to surgery    neomycin (MYCIFRADIN) 500 mg tablet Take 2 tablets (1000 mg total) 3 times at 1 PM,  3 PM and 10 PM the day prior to surgery    ondansetron (ZOFRAN) 4 mg tablet Take 1 tablet (4 mg total) by mouth every 8 (eight) hours as needed for Nausea to be taken on the day of the bowel preparation for surgery    ondansetron (ZOFRAN) 8 mg tablet Take 8 mg po every 6-8 hours as needed for nausea related to colonoscopy prep.   To be taken as needed for nausea ( see colonoscopy prep instructions for details)    ondansetron (ZOFRAN) 8 mg tablet Take 8 mg po every 6- 8 hours up to two doses, as needed for nausea related to colonoscopy prep    simethicone (MYLICON) 125 mg chewable tablet PLEASE PURCHASE OVER THE COUNTER. PLEASE TAKE 3 TABLETS (approx. 400 mg) THE NIGHT BEFORE THE PROCEDURE and 3 TABLETS (approx. 400 mg) THE MORNING OF WHILE DRINKING THE BOWEL PREP.        Allergies as of 09/10/2020  Review status set to Review Complete by Princella Pellegrini, NP on 09/10/2020   No Known Allergies           Anesthesia Encounter History        CC/HPI/Past Medical History Summary: Chelsea Ball is a 60 y.o. female with segmental colitis associated with diverticulosis now scheduled for ENDO ADULT COLONOSCOPY WITH BIOPSY with Dr. [name] on 09/14/20 at [facility].       PMH:  - Asthma- Qvar and albuterol. Denies recent exacerbations/hospitalizations/intubations/steroid therapy.  - GERD- as needed nexium  - Bipolar disorder- lamictal and prozac   - Current smoker- 3 cigarettes per day.           (Please refer to [facility] Allergies, Problems, Past Medical History, Past  Surgical History, Social History, and Family History activities, Results for current data from these respective sections of the chart; these sections of the chart are also summarized in reports, including the Patient Summary Extracts found in Chart Review)      Summary of Outside Records:  ~~~~~~~~~~~~~~~~~~~~~~~~  No Outside Other Records Scanned  CT abdomen/pelvis 09/09/20 (Care Everywhere):  IMPRESSION:   1. No acute  intra-abdominal or pelvic pathology.   2. Colonic diverticulosis without diverticulitis.   3. Postcholecystectomy surgical changes.       ~~~~~~~~~~~~~~~~~~~~~~~~  Summary of Prior Anesthetics: Previous anesthetic without AAC          Review of Systems Functional Status: Climb a flight of stairs or walk up a hill (5.50 METs)   Constitutional: Negative for chills and fever.   Airway: Negative for limited neck movement, difficulty opening mouth, dental hardware, TMJ problem, snoring, witnessed apnea and daytime somnolence  HENT: Negative for congestion, hearing loss, trouble swallowing and environmental allergies.   Respiratory: Negative.  Negative for cough, Recent URI Symptoms and shortness of breath.    Cardiovascular: Negative.  Negative for leg swelling, palpitations and chest pain.   Gastrointestinal: Positive for abdominal pain and GERD Symptoms. Negative for constipation, diarrhea, nausea and vomiting.   Genitourinary: Negative for difficulty urinating, dysuria, frequency and urgency.   Musculoskeletal: Negative for neck pain and back pain.        Denies use of antiplatelets/anticoagulants, history of bleeding disorders and/or history of DVT/PE.      Skin: Negative for rash and wound.   Neurological: Negative for headaches, light-headedness, syncope and seizures.   Hematological: Negative for environmental allergies. Does not bruise/bleed easily.        Denies use of antiplatelets/anticoagulants, history of bleeding disorders and/or history of DVT/PE.      Psychiatric/Behavioral/Developmental:  Negative.        Physical Exam     Height 154.9 cm (5\' 1" ), weight 62.6 kg (138 lb).                        Prepare (Pre-Operative Clinic) Assessment/Plan/Narrative  Prepare Clinic consult type: Telephone  09/10/20: Pre-anesthesia screening completed. Informed patient of COVID testing requirements and reviewed pre-op self-quarantine guidelines. NPO and medication instructions sent via unencrypted email per patient request. Email confirmed by patient. COVID test scheduled [place] 6/6 . - N. Martinez-Lee, NP      Obstructive Sleep Apnea Screening  STOPBANG Score:      S Do you Snore loudly (louder than talking or loud  enough to be heard through closed doors)? No    T Do you often feel Tired, fatigued, or sleepy during daytime? No    O Has anyone Observed you stop breathing during  your sleep? No    P Do you have or are you being treated for high blood Pressure? No    B BMI more than 35 KG/m^2? No    A Age over 66 years old? Yes    N Neck circumference > 16 inches (40cm)?     G Gender: Female? No    STOPBANG Total Score 1         Risk Level (based only on STOPBANG) Low - Incomplete       CPAP/BiPAP prescribed - No.              Anesthesia Assessment and Plan  ASA 2       Anesthesia Plan  Invasive Monitors/Vascular Access: None    (See Anesthesia Record for attending attestation)    [Please note, smart link data included in this note may not reflect changes since note creation. Please see appropriate section of [facility]eX for up-to-the minute information.]

## 2020-09-12 NOTE — H&P (Shared)
Procedure Planned: Colonoscopy  Diagnosis: diverticulosis    Chief Complaint: diverticulosis    Past Medical History:   Diagnosis Date    Abdominal pain 01/21/2019    Bipolar 1 disorder (CMS code) 01/21/2019    Depression 01/21/2019    GERD (gastroesophageal reflux disease) 05/04/2016     Past Surgical History:   Procedure Laterality Date    CHOLECYSTECTOMY  04/26/2017    HYSTERECTOMY      right open carpal tunnel release (Right Wrist)  05/15/2018     Allergies/Contraindications  No Known Allergies   No medications prior to admission.       Social History  She   reports that she has been smoking. She has a 10.00 pack-year smoking history. She has never used smokeless tobacco. She reports current alcohol use. She reports previous drug use.   Family History  family history is not on file.   Family history is otherwise negative or as noted above.      Physical Exam:  Vss:  There were no vitals taken for this visit.   Wt Readings from Last 3 Encounters:   09/10/20 62.6 kg (138 lb)   01/28/19 53.1 kg (117 lb)      Mallampati Score: 2  ASA Classification: 2  Constitutional: Well-appearing.  No acute distress.  Eyes: Normal eyelids and conjunctivae, pupils equal round and reactive to light  Ears, Nose, Mouth, Throat: Atraumatic, normocephalic, normal lips, teeth, and gum, moist mucous membranes  Neck:  Neck supple, no lymphadenopathy  Respiratory:  Normal respiratory effort, no accessory muscle use or intercostal retraction, no dullness to percussion, clear to auscultation bilaterally  Cardiovascular:  Regular rate and rhythm, normal s1/s2, no lower extremity edema  Gastrointestinal:  Soft, nontender, nondistended, no masses, no hepatosplenomegaly, no guarding, no rebound   Hem/Lymphatic: No lymphadenopathy of neck or axillae  Muskuloskeletal: No clubbing or cyanosis of hands.    Skin:  No rashes, ulcers or lesions.  No nodules, scaling or induration.  Neurologic:  Gait normal  Psychiatric: Oriented to time, place,  and person.  Normal mood and affect.     I have personally reviewed the following blood tests listed below:     Latest [facility]:  No results found for: WBC, RBC, HGB, HCT, MCV, MCH, MCHC, PLT, CBCD  No results found for: ALT, AST, ALKP, DBILI, TBILI, ANA4, TBILWB, GGT, GGTEXL, GGTEXQ, ANA5   No results found for: AMY  No results found for: LIPA  No results found for: CRP, CRPEXL, CRPEXQ       I have reviewed the studies below     Latest imaging and other diagnostics:       Last Colonoscopy:    _______________________________  ASSESSMENT AND PLAN: 30F colonoscopy for divertculosis    Plan for: Anesthesia Consult    Immediate Pre-Sedation Assessment Completed including response to Pre-Medication.    Airway Status Unchanged - Cleared for Sedation and Procedure    DOCUMENTATION OF INFORMED CONSENT  I have discussed the risks, benefits, and alternatives of the procedure and sedation with the patient and/or the patient's medical decision-maker.  This discussion included, but was not limited to, the risk of bleeding, infection, damage to anatomical structures, need for reoperation, or even death.  The patient and/or the patient's medical decision maker understands, has had all of his/her questions answered, and desires to proceed.  Informed consent obtained.

## 2020-09-13 NOTE — Telephone Encounter (Signed)
Gastroenterology Clinic Telephone Contact Note    Called patient because she had reported to our clinic being in the ED 3 days ago with an infection. Reports diagnosed with bacterial vaginosis and treated with flagyl, rescheduled colonoscopy now to August. If cancellation in the meantime we may be able to do procedure sooner. Abdominal pain has improved since leaving ED.     Percell Boston MD  Assistant Professor  Gastroenterology

## 2020-09-17 MED ORDER — POLYETHYLENE GLYCOL 3350 17 GRAM/DOSE ORAL POWDER
17 | ORAL | 3 refills | Status: AC
Start: 2020-09-17 — End: ?

## 2020-09-17 MED ORDER — MESALAMINE 1.2 GRAM TABLET,DELAYED RELEASE
1.2 | ORAL | 1 refills | Status: DC
Start: 2020-09-17 — End: 2020-11-23

## 2020-09-18 ENCOUNTER — Ambulatory Visit: Admit: 2020-09-18 | Discharge: 2020-09-18 | Payer: MEDICAID | Primary: Physician

## 2020-09-18 DIAGNOSIS — K5010 Crohn's disease of large intestine without complications: Secondary | ICD-10-CM

## 2020-09-18 DIAGNOSIS — K5730 Diverticulosis of large intestine without perforation or abscess without bleeding: Secondary | ICD-10-CM

## 2020-09-18 DIAGNOSIS — Z01818 Encounter for other preprocedural examination: Secondary | ICD-10-CM

## 2020-09-18 LAB — COVID-19 RNA, RT-PCR/NUCLEIC ACID AMPLIFICATION: COVID-19 RNA, RT-PCR/Nucleic Acid Amplification: NOT DETECTED

## 2020-09-20 DIAGNOSIS — K5010 Crohn's disease of large intestine without complications: Secondary | ICD-10-CM

## 2020-09-20 DIAGNOSIS — K5730 Diverticulosis of large intestine without perforation or abscess without bleeding: Secondary | ICD-10-CM

## 2020-09-20 MED ORDER — LIDOCAINE (PF) 100 MG/5 ML (2 %) INTRAVENOUS SYRINGE
100 | INTRAVENOUS | Status: DC | PRN
Start: 2020-09-20 — End: 2020-09-20
  Administered 2020-09-20: 21:00:00 via INTRAVENOUS

## 2020-09-20 MED ORDER — SODIUM CHLORIDE 0.9 % INTRAVENOUS SOLUTION
0.9 | INTRAVENOUS | Status: DC
Start: 2020-09-20 — End: 2020-09-20
  Administered 2020-09-20: 20:00:00 via INTRAVENOUS

## 2020-09-20 MED ORDER — PHENYLEPHRINE 10 MG/ML INJECTION SOLUTION
10 mg/mL | INTRAMUSCULAR | Status: DC | PRN
  Administered 2020-09-20: 21:00:00 via INTRAVENOUS

## 2020-09-20 MED ORDER — PROPOFOL 10 MG/ML INTRAVENOUS EMULSION
10 | INTRAVENOUS | Status: DC | PRN
Start: 2020-09-20 — End: 2020-09-20
  Administered 2020-09-20: 21:00:00 300 via INTRAVENOUS
  Administered 2020-09-20: 21:00:00 80 via INTRAVENOUS

## 2020-09-20 MED ORDER — LIDOCAINE (PF) 10 MG/ML (1 %) INJECTION SOLUTION
10 | INTRAMUSCULAR | Status: DC
Start: 2020-09-20 — End: 2020-09-20

## 2020-09-20 MED ORDER — ACETAMINOPHEN 500 MG GELCAP: 500 mg | ORAL | Status: DC | PRN

## 2020-09-20 MED ORDER — ONDANSETRON HCL (PF) 4 MG/2 ML INJECTION SOLUTION
4 mg/2 mL | INTRAMUSCULAR | Status: DC | PRN
  Administered 2020-09-20: 21:00:00 via INTRAVENOUS

## 2020-09-20 MED ORDER — ONDANSETRON HCL (PF) 4 MG/2 ML INJECTION SOLUTION
4 | Freq: Once | INTRAMUSCULAR | Status: DC
Start: 2020-09-20 — End: 2020-09-20

## 2020-09-20 MED FILL — ONDANSETRON HCL (PF) 4 MG/2 ML INJECTION SOLUTION: 4 mg/2 mL | INTRAMUSCULAR | Qty: 2

## 2020-09-30 NOTE — Telephone Encounter (Addendum)
[place] [employer] [facility]  Chelsea Ball   MRN: [id number]   Date of Birth: 04-23-1960     The patient exceeded risk threshold RED on Wed, 29 Sep 2020 04:36:28 GMT   based on established flags     Question(s) with flags that caused the Alert (up to last 5 values   recorded)     Question: SCCAI score    Date:   2020-09-29    Color:  red    Answers: >9 (Severe disease): Current score (10), Prior score (8)       Called to assess symptoms.   Pt complained abdominal pain 6-7 out of 10 after eating and lasts about 20 mins.   Abdomen is soft to touch.   Denied N&V  [facility] 2-4 daily, not much come out but felt the urgency to go.     Pt requested to schedule EGD.   Poor acid reflux per patient

## 2020-10-27 NOTE — Telephone Encounter (Signed)
I tried to call the patient to assess her symptoms and concerns. But she refused to talk with anyone but insist to talk with Dr. [name] only.      ===View-only below this line===  ----- Message -----  From: Eulis Canner  Sent: 10/27/2020  11:15 AM PDT  To: Rolland Bimler, RN  Subject: Please advise pt                                 Hi Larita Fife,    Pt is requesting to talk with Dr. [name] regarding:  Pt is having abdominal pain since for a while and also wanted to talk to Dr. [name] regarding side effects of Budesonide.    Please advise pt!  CB# [phone], okay to leave a detail message.    Thank you,    Sharlet Salina

## 2020-10-28 MED ORDER — BISMUTH SUBSALICYLATE 262 MG TABLET
262 | ORAL_TABLET | Freq: Two times a day (BID) | ORAL | 0 refills | Status: DC | PRN
Start: 2020-10-28 — End: 2020-12-14

## 2020-10-28 NOTE — Telephone Encounter (Signed)
Gastroenterology Clinic Telephone Contact Note    Still having some pain in the lower stomach and some sea-like odor.     Reporting a lot of fatigue and muscle aches since stopping the budesonide. I will check AM ACTH and cortisol and e-consult endocrine for consideration of adrenal insufficiency.       Percell Boston MD  Assistant Professor  Gastroenterology

## 2020-11-04 NOTE — E-Consult Note (Signed)
I am requesting an eConsult for this 60 y.o. woman for adrenal insufficiency.  My clinical question: 76F who was treated with oral budesonide (doses ranging from 3mg  to 9mg  daily for about 14 months), now reporting fatigue and muscle soreness after discontinuing. I have ordered AM cortisol and ACTH but would appreciate assistance with ACTH stim test if this is felt to be appropriate.   Pre-eConsult data:  - Baseline AM ACTH and cortisol followed by ACTH Stim test (Give Cosyntropin 0.25mg  IM, draw cortisol 30-45 min later) - Endocroine can assist with the ACTH stim test if needed.  If this clinical question is deemed too complex for eConsult, please;  Schedule this patient for in-person consultation. The patient understands that she may receive a phone call from the specialty practice to schedule an appointment.    SPECIALIST'S E-CONSULT RESPONSE        Labs and chart reviewed.     60 yo F with multiple medical issues, on budesonide for >1 yr for treatment of segmental colitis.   Pt has muscle pain and soreness after stopping rx and there is concern about adrenal insufficiency (AI).     1. Agree with your plan to check AM ACTH and cortisol levels. Please make sure pt has this done before 8:30 AM.  If cortisol levels are >18-20 (assuming pt has not taken any steroids for at least a week), then AI is highly unlikely, and cosyntropyn stimulation test is not necessary.   --pt's last albumin level in 2020 was 4, so checking free cortisol levels is probably not necessary.    2. If cortisol level in AM is <5, then AI is highly likely.  We would recommend repeating the levels once, and then starting the pt on low dose steroids (cortef 10 mg in Am and 5 mg in afternoon) and sending a formal referral for evaluation.     3. If cortisol levels are in between the above results, then cort stim test is indicated.  This can be ordered at the [facility] -- they have an order set.  If you would like [place] to send you the  order set, please let [facility] know your fax number and we can fax it; alternatively, we can also email it to you.     Best,     [place], MD    I spent 21-30 minutes reviewing and completing thiseConsult.      This eConsult is based on the clinical data available to me and is furnished without benefit of a comprehensive evaluation or physical examination.  The above will need to be interpreted in light of any clinical issues, or changes in patient status, not available to me at the time of filing this eConsult.  Please alert me if you have further questions.

## 2020-11-08 NOTE — Telephone Encounter (Signed)
Gastroenterology Clinic Telephone Contact Note    Called Ms. Jividen to inform her of results of endocrine e-consult. Next step is to get the AM [facility] at [facility] that I have already ordered, needs to be an AM sample before 8:30am. She did not answer so I left a message.     Percell Boston MD  Assistant Professor  Gastroenterology

## 2020-11-09 ENCOUNTER — Ambulatory Visit: Payer: MEDICAID | Primary: Physician

## 2020-11-09 DIAGNOSIS — Z01818 Encounter for other preprocedural examination: Secondary | ICD-10-CM

## 2020-11-09 NOTE — Anesthesia Pre-Procedure Evaluation (Addendum)
[facility]  Anesthesia Preprocedure Evaluation    Procedure Information     Case: [id number] Date/Time: 11/23/20 1624    Procedure: ENDO ADULT EGD WITH BIOPSY (N/A )    Diagnosis: Dysphagia [R13.10]    Pre-op diagnosis: Dysphagia [R13.10]    Location: [facility] ADULT PROCEDURE RM 27 / [facility]    Surgeons: Guido Sander, MD          Precautions          None          Relevant Problems   No relevant active problems     Patient Active Problem List    Diagnosis Date Noted    Segmental colitis associated with diverticulosis (CMS code) 06/17/2019     Added automatically from request for surgery [id number]      History of colonoscopy 02/02/2019     01/2019 colonoscopy with one tubular adenoma and two sessile serrated adenomas; recommend repeat colonoscopy in 3 years.       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 Last addressed on 12/18/2012. Problem automatically mapped to SNOMED code "Eczema (82993716)" from [facility] Chronic Conditions table on 12/20/2013.      Gastroduodenitis 06/23/2011     Converted note: Onset date 06/23/2011. Last addressed on  03/29/2012. Problem automatically mapped to SNOMED code "Gastroduodenitis (967893810)" from [facility] Chronic Conditions table on 12/20/2013.      Moderate persistent asthma 04/10/2011     Converted note: Onset date 04/10/2011. Last addressed on 05/30/2013. Problem automatically mapped to SNOMED code "Moderate persistent asthma (175102585)" from [facility] Chronic Conditions table on 12/20/2013.      Back pain 06/22/2006      Past Medical History:   Diagnosis Date    Abdominal pain 01/21/2019    Bipolar 1 disorder (CMS code) 01/21/2019    Depression 01/21/2019    GERD (gastroesophageal reflux disease) 05/04/2016     Past Surgical History:   Procedure Laterality Date    CHOLECYSTECTOMY  04/26/2017    HYSTERECTOMY      right open carpal tunnel release (Right Wrist)  05/15/2018     Current Medications       Dosage    albuterol 90 mcg/actuation metered dose inhaler Inhale into the lungs every 6 (six) hours as needed for Wheezing    beclomethasone (QVAR REDIHALER) 80 mcg/actuation breath activated inhaler TAKE 2 PUFFS BY MOUTH TWICE A DAY    bismuth subsalicylate 277 mg TAB Take 1 tablet by mouth 2 (two) times daily as needed (stool change) For H pylori    budesonide (  ENTOCORT EC) 3 mg 24 hr capsule TAKE 2 CAPSULES BY MOUTH EVERY MORNING    calcium carbonate-vitamin D 600 mg(1,583m) -400 unit tablet TAKE 1 TABLET BY MOUTH EVERY DAY    esomeprazole (NEXIUM) 40 mg capsule Take 40 mg by mouth daily as needed    estrogen, conjugated,-medroxyPROGESTERone (PREMPRO) 0.625-2.5 mg tablet Take 1 tablet by mouth daily    HYDROcodone-acetaminophen (NORCO) 5-325 mg tablet Take 1 tablet by mouth    hydrOXYzine (ATARAX) 25 mg tablet Take 25 mg by mouth every 6 (six) hours as needed    Lactobacillus acidophilus (PROBIOTIC ORAL) Take by mouth daily    lamoTRIgine (LAMICTAL) 100 mg tablet Take by mouth daily    loratadine (CLARITIN) 10 mg tablet Take 10 mg by mouth daily    mesalamine (LIALDA) 1.2 gram EC tablet TAKE 1 TABLET BY MOUTH EVERY  DAY    polyethylene glycol (MIRALAX) 17 gram/dose powder DISSOLVE 17 GRAMS INTO WATER AND DRINK BY MOUTH EVERY DAY    pregabalin (LYRICA) 75 mg capsule Take 75 mg by mouth daily as needed    budesonide (ENTOCORT EC) 3 mg 24 hr capsule TAKE 1 CAPSULE BY MOUTH EVERY DAY IN THE MORNING    metroNIDAZOLE (FLAGYL) 500 mg tablet Take 2 tablet (1000 mg) 3 times at 1 PM,  3 PM and 10 PM the day prior to surgery    neomycin (MYCIFRADIN) 500 mg tablet Take 2 tablets (1000 mg total) 3 times at 1 PM,  3 PM and 10 PM the day prior to surgery    ondansetron (ZOFRAN) 4 mg tablet Take 1 tablet (4 mg total) by mouth every 8 (eight) hours as needed for Nausea to be taken on the day of the bowel preparation for surgery    ondansetron (ZOFRAN) 8 mg tablet Take 8 mg po every 6-8 hours as needed for nausea related to colonoscopy prep.   To be taken as needed for nausea ( see colonoscopy prep instructions for details)    ondansetron (ZOFRAN) 8 mg tablet Take 8 mg po every 6- 8 hours up to two doses, as needed for nausea related to colonoscopy prep    simethicone (MYLICON) 1536mg chewable tablet PLEASE PURCHASE OVER THE COUNTER. PLEASE TAKE 3 TABLETS (approx. 400 mg) THE NIGHT BEFORE THE PROCEDURE and 3 TABLETS (approx. 400 mg) THE MORNING OF WHILE DRINKING THE BOWEL PREP.        Allergies as of 11/09/2020  Review status set to Review Complete by DDoreatha Martin RN on 09/20/2020   No Known Allergies           Anesthesia Encounter History        CC/HPI/Past Medical History Summary: Chelsea Renkenis a 60y.o. W with segmental colitis associated with diverticulosis now scheduled for ENDO ADULT EGD WITH BIOPSY with Dr. SChalmers Cateron 11/23/20 at [facility].       PMH:  - Asthma- Qvar and albuterol. Denies recent exacerbations/hospitalizations/intubations/steroid therapy.  - GERD- as needed nexium, takes tums.  - Bipolar disorder- lamictal and prozac.    - Current smoker- 3 cigarettes per day.           (Please refer to APeX Allergies, Problems, Past  Medical History, Past Surgical History, Social History, and Family History activities, Results for current data from these respective sections of the chart; these sections of the chart are also summarized in reports, including the Patient Summary Extracts found in Chart Review)        Summary of  Prior Anesthetics: Previous anesthetic without AAC          Review of Systems Functional Status: Climb a flight of stairs or walk up a hill (5.50 METs)   Constitutional: Negative for chills and fever.   Airway: Negative for limited neck movement, difficulty opening mouth, dental hardware, TMJ problem, snoring, witnessed apnea and daytime somnolence  HENT: Negative for congestion, hearing loss, trouble swallowing and environmental allergies.   Respiratory: Negative.  Negative for cough, Recent URI Symptoms and shortness of breath.    Cardiovascular: Negative.  Negative for leg swelling, palpitations and chest pain.   Gastrointestinal: Positive for abdominal pain and GERD Symptoms. Negative for constipation, diarrhea, nausea and vomiting.        No recent GERD symptoms   Genitourinary: Negative for difficulty urinating, dysuria, frequency and urgency.   Musculoskeletal: Negative for neck pain and back pain.        Denies use of antiplatelets/anticoagulants, history of bleeding disorders and/or history of DVT/PE.      Skin: Negative for rash and wound.   Neurological: Negative for headaches, light-headedness, syncope and seizures.   Hematological: Negative for environmental allergies. Does not bruise/bleed easily.        Denies use of antiplatelets/anticoagulants, history of bleeding disorders and/or history of DVT/PE.      Psychiatric/Behavioral/Developmental: Negative.        Physical Exam    Airway:   Modified Mallampati score: II. Thyromental distance: > 6.5 cm. Mouth opening: good. Neck range of motion: full.   Constitutional:       Appearance: She is well-developed and well-nourished.   HENT:      Mouth/Throat:       Mouth: Oropharynx is clear and moist.     Eyes:      Conjunctiva/sclera: Conjunctivae normal.   Cardiovascular:      Rate and Rhythm: Normal rate and regular rhythm.      Heart sounds: Normal heart sounds.   Pulmonary:      Effort: Pulmonary effort is normal.      Breath sounds: Wheezing present. Examination reveals wheezes of the left field.   Musculoskeletal: Normal range of motion.      Cervical back: Normal range of motion. Neurological:   Level of consciousness: alert.      Dental: Patient has upper dentures and lower dentures.          Height 154.9 cm (_0 ), weight 63.5 kg (139 lb 15.9 oz).      BP Readings from Last 3 Encounters:   09/20/20 112/67   01/28/19 92/57               Prepare (Pre-Operative Clinic) Assessment/Plan/Narrative  Prepare Clinic consult type: Telephone  11/09/20: Pre-anesthesia screening completed. Informed patient of COVID testing requirements and reviewed pre-op self-quarantine guidelines. NPO and medication instructions sent via postal mail. COVID test scheduled [facility] 8/12- N. Martinez-Lee, NP      Obstructive Sleep Apnea Screening  STOPBANG Score:      S Do you Snore loudly (louder than talking or loud  enough to be heard through closed doors)? No    T Do you often feel Tired, fatigued, or sleepy during daytime? No    O Has anyone Observed you stop breathing during  your sleep? No    P Do you have or are you being treated for high blood Pressure? No    B BMI more than 35 KG/m^2? No    A Age over 48 years old?  Yes    N Neck circumference > 16 inches (40cm)?     G Gender: Female? No    STOPBANG Total Score 1         Risk Level (based only on STOPBANG) Low - Incomplete       CPAP/BiPAP prescribed - No.              Anesthesia Assessment and Plan  Day Of Surgery Provider Chart Review:  NPO status verified  Medications reviewed  Allergies reviewed  Problem list reviewed  Anesthesia history reviewed  Pertinent labs reviewed  Consults reviewed    ASA 2       Anesthesia Plan  Anesthesia Type:  MAC  Induction Technique: IntraVenous  Invasive Monitors/Vascular Access: None  Possible Advanced Airway Techniques: None  Planned Recovery Location: PACU    Blood Product PreparationBlood Products Plan: N/A, minimal risk    Anesthesia Potential Complication Discussion  There is the possibility of rare but serious complications.    Informed Consent for Anesthesia  Consent obtained from patient    Risks, benefits and alternatives including those of invasive monitoring discussed. Increased risks (as above) discussed.  Questions invited and all answered.  Interpreter: N/A - patient/guardian's preferred language is [place].  Consent granted for anesthetic plan    Quality Measure Documentation   Opioid Therapy Planned? Yes    (See Anesthesia Record for attending attestation)    [Please note, smart link data included in this note may not reflect changes since note creation. Please see appropriate section of APeX for up-to-the minute information.]

## 2020-11-09 NOTE — Patient Instructions (Addendum)
We want your upcoming surgical visit to be as safe and comfortable as possible. The following instructions are designed to prepare you for your surgical hospitalization.  Please read and follow all instructions carefully.    Procedure Location  Your procedure is scheduled to take place at [facility], [address], 1st floor Children's Admissions. Phone [phone]    Procedure Date and Time  Please arrive on 11/23/2020.       Contact your surgeon's office if you have not received your time of arrival for the day surgery    If for any reason your surgery start time is changed, you will receive a call from your surgeons office.  Should you have any questions regarding the date or time of arrival, please call your surgeons office and ask to speak with his/her practice assistant.    Preparing for Surgery  What can I eat or drink on the day of surgery?  Please do not have anything to eat or drink except clear liquids after midnight the evening before your surgery (including gum, candy or mints).  You may have clear liquids on the day of surgery up to 2 hours prior to arrival.  Clear liquids include:  Non-pulp, clear apple juice.  Tea with sugar or sweetener (NO milk, cream, or milk substitute)  Gatorade  Water  If you drink anything other than clear liquids on the day of surgery, or if you have ANYTHING to drink in the two hours prior to hospital arrival, then your doctors will cancel your surgery for your safety.  If you have been instructed to take any medications the day of surgery, take them with a small sip of water.    If your surgeon has given you additional instructions about what to eat or drink before the day of surgery, please follow those instructions as well.    Which medications should I take on the day of surgery?     Medication List            Accurate as of November 09, 2020  8:25 AM. Always use your most recent med list.                Medication  Information        Take Morning of Surgery Special Instructions Other   albuterol 90 mcg/actuation metered dose inhaler  Inhale into the lungs every 6 (six) hours as needed for Wheezing  Commonly known as: PROVENTIL HFA;VENTOLIN HFA     As needed   Please bring your inhaler on day of surgery        bismuth subsalicylate 262 mg Tab  Take 1 tablet by mouth 2 (two) times daily as needed (stool change) For H pylori     As needed                     calcium carbonate-vitamin D 600 mg-10 mcg (400 unit) tablet  TAKE 1 TABLET BY MOUTH EVERY DAY     Do not take medication the day of surgery           esomeprazole 40 mg capsule  Take 40 mg by mouth daily as needed  Commonly known as: NEXIUM     As needed         estrogen (conjugated)-medroxyPROGESTERone 0.625-2.5 mg tablet  Take 1 tablet by mouth daily  Commonly known as: PREMPRO     Take medication the day of surgery  HYDROcodone-acetaminophen 5-325 mg tablet  Take 1 tablet by mouth  Commonly known as: NORCO     As needed         hydrOXYzine 25 mg tablet  Take 25 mg by mouth every 6 (six) hours as needed  Commonly known as: ATARAX     As needed         lamoTRIgine 100 mg tablet  Take by mouth daily  Commonly known as: LaMICtal     Take medication the day of surgery           loratadine 10 mg tablet  Take 10 mg by mouth daily  Commonly known as: CLARITIN     Take medication the day of surgery           mesalamine 1.2 gram EC tablet  TAKE 1 TABLET BY MOUTH EVERY DAY  Commonly known as: LIALDA                                            polyethylene glycol 17 gram/dose powder  DISSOLVE 17 GRAMS INTO WATER AND DRINK BY MOUTH EVERY DAY  Commonly known as: MIRALAX     Do not take medication the day of surgery           pregabalin 75 mg capsule  Take 75 mg by mouth daily as needed  Commonly known as: LYRICA     As needed         PROBIOTIC ORAL  Take by mouth daily     Take medication the day of surgery           QVAR REDIHALER 80 mcg/actuation breath activated inhaler  TAKE  2 PUFFS BY MOUTH TWICE A DAY  Generic drug: beclomethasone     Take medication the day of surgery   Take medication the night before surgery as usual            Preparing to Come to the Hospital  Showering Instructions:  Shower the morning of surgery. After showering, do not apply lotion, cream, powder, deodorant, or hair conditioner.   Do not shave or remove body hair. Shaving your face is generally fine. If you are having head surgery, however, ask your doctor whether you can shave.      Wear casual, loose fitting, comfortable clothing and leave all valuables, including jewelry, and large sums of cash at home.  Leave your valuables at home. Belongings that remain with you are your responsibility. [facility] is not liable for loss or damage. If valuables are brought to the hospital, they will be identified and recorded by our admitting team. All jewelry must be removed and left at home. If rings cannot be removed, we encourage you to have them removed by a jeweler prior to surgery to avoid damage or loss of ring.   is not responsible of lose or damaged jewelry. This policy protects the patient and prevents the items from being lost or damaged.   Leave contact lenses at home. Wear your eyeglasses and bring a case.  If you develop any illness prior to surgery (fever, cough, sore throat, cold, flu, infection), OR START A NEW MEDICATION, please call your surgeon and the  Adult Prepare Clinic at [phone].  If you are spending the night you may bring toiletries and sleeping clothes if you desire; otherwise the hospital will provide them for you.  DO NOT bring your medications with you to the hospital unless you were specifically instructed to do so.  DO bring a list of your medications including dose(s) and when you take them.  DO bring TWO forms of ID - including one ID with a photo.    Leaving the Hospital  Please ask your surgeon about your anticipated length of stay.  Please make sure your ride is  available to pick you up by 12N on day of discharge  It is recommended that all patients have a responsible person at home the first night after discharge from the hospital.  ALL patients, including same day surgery patients, must arrange for an adult to drive/escort them home upon discharge.  Patients going home the same day of surgery will have their procedure cancelled if these arrangements are not made ahead of time.    Family and Friends  Friends and family may wait in the assigned waiting areas.  Patient care coordinators are available in these patient waiting areas to provide updates regarding patient progress; hospital room assignments; and discharge planning (for same day surgery).    [facility], [address], 2nd floor Children's surgery     Guided Imagery  Research has shown that listening to guided imagery is helpful for many health conditions.  [facility] offers these sessions to listen to at the following website: [email or web address]-[email or web address]-resources     Please consider guided imagery to prepare for surgery and for coping with stress, sleep and pain.      COVID Screening On-Demand  Screening for COVID is required for all patients and visitors arriving to a [facility] facility. To expedite the screening process, you are welcome to utilize the [facility] screening on-demand tool. Please follow the link from your smartphone or scan the QR code to open the screening tool on the day of your arrival and answer the screening questions prior to your arrival.     On-demand COVID screening: [email or web address]      Temporary Visitor Restrictions During the COVID-19 Emergency    Wed like to provide advance notice of additional protections our hospital has temporarily put in place for the safety of our patients and visitors, including you, your loved ones, and our healthcare providers.    Visitors are restricted from the hospital and our surgical waiting area will be closed  to non-patients.  If you are not going home the day of surgery, we ask that your family members/visitors stay at home.  If you are going home the day of your surgery, your family member or person who will drive you home will be unable to accompany you to the preop / pacu area.  We strongly encourage your family member/driver to practice social distancing in the location they chose to wait.  If the driver lives in [place], we encourage them to return home to wait.      Patients will need to undergo a health screening on arrival and will not be allowed if they have symptoms of illness, including cough, runny nose, sneezing, fever, and sore throat.  Additionally, children under the age of 59 are restricted from the hospital.    Thank you for your cooperation.    [facility], Radiation Oncology Procedure Room Visitor Policy:    Patients are allowed one guest to accompany them to Radiation Oncology but must wait in our main waiting room, [facility] lobby, or Cancer Resource Center (or leave and return for patient pick-up)  Guests are  not allowed to be in the procedure suites or patient holding bays  Our staff will be contacting the guest/caregiver regarding patients' status and pick-up information     FAQ     1.     What if I or the person who is scheduled to take me home following my procedure has one of the symptoms you stated?  Please attempt to secure a ride home with a different friend or family member. If thats not possible, please contact your surgeons office immediately to inquire whether or not it is appropriate to reschedule the procedure.     2.     Why are children not allowed?  This is a precaution put in place by our clinical leadership and intended to ensure the safety of our patients. Younger children can carry viruses even when they are not symptomatic.                 Guill[place]ina [place] MSN, NP-BC  Prepare Program at [facility]  [address]rd [address] [place],  [place] [address]  Office [phone] Fax [phone]

## 2020-11-23 MED ORDER — PROPOFOL 10 MG/ML INTRAVENOUS EMULSION
10 | INTRAVENOUS | Status: AC
Start: 2020-11-23 — End: ?

## 2020-11-23 MED ORDER — ACETAMINOPHEN 500 MG GELCAP
500 | Freq: Once | ORAL | Status: AC | PRN
Start: 2020-11-23 — End: 2020-11-24

## 2020-11-23 MED ORDER — MIDAZOLAM (PF) 1 MG/ML INJECTION SOLUTION
1 | INTRAMUSCULAR | Status: DC | PRN
Start: 2020-11-23 — End: 2020-11-23
  Administered 2020-11-24: 2 via INTRAVENOUS

## 2020-11-23 MED ORDER — PROPOFOL 10 MG/ML INTRAVENOUS EMULSION
10 | INTRAVENOUS | Status: DC | PRN
Start: 2020-11-23 — End: 2020-11-23
  Administered 2020-11-24: 200 via INTRAVENOUS

## 2020-11-23 MED ORDER — MESALAMINE 1.2 GRAM TABLET,DELAYED RELEASE
1.2 gram | ORAL | 1 refills | Status: DC
Start: 2020-11-23 — End: 2021-01-24

## 2020-11-23 MED ORDER — LACTATED RINGERS INTRAVENOUS SOLUTION
INTRAVENOUS | Status: DC | PRN
Start: 2020-11-23 — End: 2020-11-23
  Administered 2020-11-23: via INTRAVENOUS

## 2020-11-23 MED ORDER — ONDANSETRON HCL (PF) 4 MG/2 ML INJECTION SOLUTION
42 | Freq: Four times a day (QID) | INTRAMUSCULAR | Status: DC | PRN
Start: 2020-11-23 — End: 2020-11-24

## 2020-11-23 MED ORDER — SODIUM CHLORIDE 0.9 % INTRAVENOUS SOLUTION
0.9 | INTRAVENOUS | Status: DC
Start: 2020-11-23 — End: 2020-11-24
  Administered 2020-11-23: 21:00:00 via INTRAVENOUS

## 2020-11-23 MED ORDER — OXYCODONE 5 MG TABLET: 5 mg | ORAL | Status: DC | PRN

## 2020-11-23 MED ORDER — DEXMEDETOMIDINE 100 MCG/ML INTRAVENOUS SOLUTION
100 | INTRAVENOUS | Status: DC | PRN
Start: 2020-11-23 — End: 2020-11-23
  Administered 2020-11-24: 10 via INTRAVENOUS

## 2020-11-23 MED ORDER — MIDAZOLAM 1 MG/ML INJECTION SOLUTION
1 | INTRAMUSCULAR | Status: AC
Start: 2020-11-23 — End: ?

## 2020-11-23 MED ORDER — LIDOCAINE (PF) 10 MG/ML (1 %) INJECTION SOLUTION
101 mg/mL (1 %) | INTRAMUSCULAR | Status: DC
Start: 2020-11-23 — End: 2020-11-24

## 2020-11-23 NOTE — Unmapped (Addendum)
Dear Ms. Linderman,   Your upper endoscopy procedure was normal. I took a small tissue sample (biopsy) of the esophagus to look for inflammation and I will let you know the result when I get it.     Dr. [name]        If you have been given medications to sedate you for your procedure(s), you must have an adult to accompany you home.  These medications will impair your ability to drive, so you cannot drive until the day after the procedure.    Diet:  Regular diet unless specified otherwise.  Eat a small first meal of any foods that you know your body tolerates well.    Activities:  Resume your regular activities tomorrow unless specified otherwise.    Today you received medications that made you sleepy during your procedure.    The following items are recommended for your care during the next 24 hrs.  Do NOT drive or operate heavy machinery until tomorrow.  Do not consume alcohol for 24 hrs.  Do not make important decisions for 24 hrs.  Rest the remainder of the day.    Procedure Discharge Instructions:   UPPER ENDOSCOPY   Notify your doctor if you:    Experience persistent vomiting or if you vomit blood.  Experience severe abdominal pain, shoulder pain, or chest pain.  Have a temperature over 101 degrees orally.  Have a sore throat lasting longer than 24 hours.  Pass black, tarry stool.    Medications:   You may resume taking your regularly scheduled and over-the-counter medications today.      Other Instructions: as above  If a biopsy was taken or a polyp was removed, please contact the doctor for results in 7-10 days at the number below or on MyChart.    If anything concerns you, please call your physician, Dr. Edwin Cap at [phone].   If you are unable to reach your doctor, call Angie at [phone] and ask for the GI Fellow on call.

## 2020-11-23 NOTE — H&P (Signed)
Procedure Planned: Upper Endoscopy  Diagnosis: dysphagia, abdominal pain    Chief Complaint: dysphagia, abdominal pain    Past Medical History:   Diagnosis Date    Abdominal pain 01/21/2019    Bipolar 1 disorder (CMS code) 01/21/2019    Depression 01/21/2019    GERD (gastroesophageal reflux disease) 05/04/2016     Past Surgical History:   Procedure Laterality Date    CHOLECYSTECTOMY  04/26/2017    HYSTERECTOMY      right open carpal tunnel release (Right Wrist)  05/15/2018     Allergies/Contraindications  No Known Allergies   Medications Prior to Admission   Medication Sig Dispense Refill    calcium carbonate-vitamin D 600 mg(1,500mg ) -400 unit tablet TAKE 1 TABLET BY MOUTH EVERY DAY      estrogen, conjugated,-medroxyPROGESTERone (PREMPRO) 0.625-2.5 mg tablet Take 1 tablet by mouth daily      HYDROcodone-acetaminophen (NORCO) 5-325 mg tablet Take 1 tablet by mouth      mesalamine (LIALDA) 1.2 gram EC tablet TAKE 1 TABLET BY MOUTH EVERY DAY 30 tablet 1    albuterol 90 mcg/actuation metered dose inhaler Inhale into the lungs every 6 (six) hours as needed for Wheezing      beclomethasone (QVAR REDIHALER) 80 mcg/actuation breath activated inhaler TAKE 2 PUFFS BY MOUTH TWICE A DAY      bismuth subsalicylate 262 mg TAB Take 1 tablet by mouth 2 (two) times daily as needed (stool change) For H pylori 60 tablet 0    budesonide (ENTOCORT EC) 3 mg 24 hr capsule TAKE 1 CAPSULE BY MOUTH EVERY DAY IN THE MORNING (Patient not taking: Reported on 09/10/2020) 30 capsule 1    budesonide (ENTOCORT EC) 3 mg 24 hr capsule TAKE 2 CAPSULES BY MOUTH EVERY MORNING 60 capsule 1    esomeprazole (NEXIUM) 40 mg capsule Take 40 mg by mouth daily as needed      hydrOXYzine (ATARAX) 25 mg tablet Take 25 mg by mouth every 6 (six) hours as needed      Lactobacillus acidophilus (PROBIOTIC ORAL) Take by mouth daily      lamoTRIgine (LAMICTAL) 100 mg tablet Take by mouth daily      loratadine (CLARITIN) 10 mg tablet Take 10 mg by  mouth daily      metroNIDAZOLE (FLAGYL) 500 mg tablet Take 2 tablet (1000 mg) 3 times at 1 PM,  3 PM and 10 PM the day prior to surgery (Patient not taking: Reported on 06/16/2019  ) 6 tablet 0    neomycin (MYCIFRADIN) 500 mg tablet Take 2 tablets (1000 mg total) 3 times at 1 PM,  3 PM and 10 PM the day prior to surgery (Patient not taking: Reported on 06/16/2019  ) 6 tablet 0    ondansetron (ZOFRAN) 4 mg tablet Take 1 tablet (4 mg total) by mouth every 8 (eight) hours as needed for Nausea to be taken on the day of the bowel preparation for surgery (Patient not taking: Reported on 06/16/2019  ) 4 tablet 0    ondansetron (ZOFRAN) 8 mg tablet Take 8 mg po every 6-8 hours as needed for nausea related to colonoscopy prep.   To be taken as needed for nausea ( see colonoscopy prep instructions for details) (Patient not taking: Reported on 05/20/2019  ) 3 tablet 0    ondansetron (ZOFRAN) 8 mg tablet Take 8 mg po every 6- 8 hours up to two doses, as needed for nausea related to colonoscopy prep 2 tablet 0  polyethylene glycol (MIRALAX) 17 gram/dose powder DISSOLVE 17 GRAMS INTO WATER AND DRINK BY MOUTH EVERY DAY 510 g 3    pregabalin (LYRICA) 75 mg capsule Take 75 mg by mouth daily as needed      simethicone (MYLICON) 125 mg chewable tablet PLEASE PURCHASE OVER THE COUNTER. PLEASE TAKE 3 TABLETS (approx. 400 mg) THE NIGHT BEFORE THE PROCEDURE and 3 TABLETS (approx. 400 mg) THE MORNING OF WHILE DRINKING THE BOWEL PREP. 6 tablet 0       Social History  She   reports that she has been smoking. She has a 10.00 pack-year smoking history. She has never used smokeless tobacco. She reports current alcohol use. She reports previous drug use.   Family History  family history is not on file.   Family history is otherwise negative or as noted above.      Physical Exam:  Vss:  BP 120/59   Pulse 86   Temp (!) 35.7 C (96.3 F) (Temporal)   Resp 18   Ht 154.9 cm (5\' 1" )   Wt 59.9 kg (132 lb)   SpO2 97%   BMI 24.94 kg/m    Wt  Readings from Last 3 Encounters:   11/23/20 59.9 kg (132 lb)   11/09/20 63.5 kg (139 lb 15.9 oz)   09/20/20 63.5 kg (140 lb)      Mallampati Score: 2  ASA Classification: 2  Constitutional: Well-appearing.  No acute distress.  Eyes: Normal eyelids and conjunctivae, pupils equal round and reactive to light  Ears, Nose, Mouth, Throat: Atraumatic, normocephalic, normal lips, teeth, and gum, moist mucous membranes  Neck:  Neck supple, no lymphadenopathy  Respiratory:  Normal respiratory effort, no accessory muscle use or intercostal retraction, no dullness to percussion, clear to auscultation bilaterally  Cardiovascular:  Regular rate and rhythm, normal s1/s2, no lower extremity edema  Gastrointestinal:  Soft, nontender, nondistended, no masses, no hepatosplenomegaly, no guarding, no rebound   Hem/Lymphatic: No lymphadenopathy of neck or axillae  Muskuloskeletal: No clubbing or cyanosis of hands.    Skin:  No rashes, ulcers or lesions.  No nodules, scaling or induration.  Neurologic:  Gait normal  Psychiatric: Oriented to time, place, and person.  Normal mood and affect.     I have personally reviewed the following blood tests listed below:     Latest [facility]:  No results found for: WBC, RBC, HGB, HCT, MCV, MCH, MCHC, PLT, CBCD  No results found for: ALT, AST, ALKP, DBILI, TBILI, ANA4, TBILWB, GGT, GGTEXL, GGTEXQ, ANA5   No results found for: AMY  No results found for: LIPA  No results found for: CRP, CRPEXL, CRPEXQ       I have reviewed the studies below     Latest imaging and other diagnostics:       Last Colonoscopy:    _______________________________  ASSESSMENT AND PLAN: 16F, EGD for dysphagia and abdominal pain    Plan for: Anesthesia Consult    Immediate Pre-Sedation Assessment Completed including response to Pre-Medication.    Airway Status Unchanged - Cleared for Sedation and Procedure    DOCUMENTATION OF INFORMED CONSENT  I have discussed the risks, benefits, and alternatives of the procedure and sedation  with the patient and/or the patient's medical decision-maker.  This discussion included, but was not limited to, the risk of bleeding, infection, damage to anatomical structures, need for reoperation, or even death.  The patient and/or the patient's medical decision maker understands, has had all of his/her questions answered, and  desires to proceed.  Informed consent obtained.

## 2020-11-24 MED FILL — DIPRIVAN 10 MG/ML INTRAVENOUS EMULSION: 10 mg/mL | INTRAVENOUS | Qty: 50

## 2020-11-24 MED FILL — MIDAZOLAM 1 MG/ML INJECTION SOLUTION: 1 mg/mL | INTRAMUSCULAR | Qty: 2

## 2020-11-28 NOTE — Telephone Encounter (Signed)
Gastroenterology Clinic Telephone Contact Note    Called patient to let her know esophagus biopsies normal.   Advised to get [facility] for possible adrenal insufficiency  Ordered a DEXA due to history of several months of budesonide exposure      Percell Boston MD  Assistant Professor  Gastroenterology

## 2020-12-14 LAB — CORTISOL, A.M.: Cortisol, serum: 0.5 ug/dL — ABNORMAL LOW

## 2020-12-14 LAB — ADRENOCORTICOTROPIC HORMONE: ACTH, Plasma: <5 pg/mL — ABNORMAL LOW (ref 6–50)

## 2020-12-14 MED ORDER — BISMUTH SUBSALICYLATE 262 MG TABLET
262 mg | ORAL_TABLET | Freq: Two times a day (BID) | ORAL | 0 refills | Status: DC | PRN
Start: 2020-12-14 — End: 2020-12-27

## 2020-12-14 MED ORDER — HYDROCORTISONE 5 MG TABLET
5 | ORAL_TABLET | ORAL | 2 refills | 7.00000 days | Status: AC
Start: 2020-12-14 — End: 2021-03-30

## 2020-12-14 NOTE — Telephone Encounter (Signed)
Gastroenterology Clinic Telephone Contact Note    AM Cortisol and ACTH results are consistent with adrenal insufficiency.   Called patient to let her know. Plan to recheck cortisol/acth and start Cortef 10mg  qam and 5mg  qpm as per endocrinology e-consult.     It is possible this is related to budesonide use, but significant adrenal insufficiency would not be common with just budesonide use rather than systemic steroid use for this period of time. Will refer to endocrine for further workup / management.     MD  Assistant Professor  Gastroenterology

## 2020-12-27 MED ORDER — BISMUTH SUBSALICYLATE 262 MG TABLET
262 mg | ORAL_TABLET | Freq: Two times a day (BID) | ORAL | 0 refills | Status: AC | PRN
Start: 2020-12-27 — End: 2021-02-02

## 2020-12-27 NOTE — Telephone Encounter (Signed)
Gastroenterology Clinic Telephone Contact Note    Called pt, she advised she is taking qvar for lungs, it seems this may also be a risk factor for adrenal insufficiency. She is feeling very fatigued and sore tried to get AM labs done but couldn't get it done before 8:30am [facility]; advised if she is not able to tolerate waiting to get the repeat test done she may start the hydrocortisone and follow up with endocrine clinic.         Percell Boston MD  Assistant Professor  Gastroenterology

## 2021-01-24 MED ORDER — MESALAMINE 1.2 GRAM TABLET,DELAYED RELEASE
1.2 | ORAL | 1 refills | 60.00000 days | Status: AC
Start: 2021-01-24 — End: 2021-03-23

## 2021-02-02 MED ORDER — BISMUTH SUBSALICYLATE 262 MG TABLET
262 mg | ORAL_TABLET | Freq: Two times a day (BID) | ORAL | 0 refills | Status: AC | PRN
Start: 2021-02-02 — End: 2022-07-05

## 2021-02-02 NOTE — Telephone Encounter (Signed)
Pt having stomach issues, smell of a fish odor, pt thinks it could be an infection, Dr. Was suppose to send Rx, pt has not received it. Pt would like to speak w/ Dr. warm transferred to clinical team

## 2021-02-02 NOTE — Telephone Encounter (Addendum)
Called pt to relay Dr. Miguel Rota message:      1, Bismuth subsalicylate was ordered  2. This medicine has anti-bacterial effect and tends to help with bad smell in stool.  3. Take it one or two times per day for 2 weeks. It should be ordered to her [facility] pharmacy.  Pt verbalized understanding and was very appreciative.                 ----- Message from Ivory Broad, MD sent at 02/02/2021 12:27 PM PDT -----  Called pt to assess symptoms.   She is taking hydrocortisone 5 mg twice a day with some improvement.   She has BM 0-1 daily, with fish smelling which very much bothers her.   The smell sticks to her body and made her embarrassing.   She still have lower abdominal pain.   She is currently on two medications only,   Hydrocortisone and mesalamine. She thought Dr. [name] ordered another medication but she did not know what is the name of the medication.     Pt was told her message would be forwarded to MD for further advice. Pt verbalized understanding and was appreciative.       Regarding: FW: Re: Antibiotic  Hi Larita Fife,  Can you call Ms. Goostree for more information? Can you ask her:   -Is she taking the hydrocortisone medication that I prescribed for her twice per day? Has it helped her feel better at all?   -Can she tell some more of what her stomach issues are?     Thanks  ----- Message -----  From: Lisbeth Renshaw  Sent: 02/02/2021  11:55 AM PDT  To: Ivory Broad, MD  Subject: Re: Antibiotic                                   Hi Dr [name],    Received a call from the patient stating that you were supposed to order antibiotic for her. Unfortunately, she did not receive any updates from you nor pharmacy.    Pt stated having stomach issues, smell of a fish odor with her BM and slight hay fever as she felt warm. Pt thinks it could be an infection and requesting for medication.      Thanks  Marylene Land

## 2021-02-04 MED ORDER — METRONIDAZOLE 500 MG TABLET
500 | ORAL_TABLET | Freq: Two times a day (BID) | ORAL | 0 refills | Status: AC
Start: 2021-02-04 — End: 2021-02-09

## 2021-02-04 NOTE — Telephone Encounter (Addendum)
Pt was informed the new prescription was sent to her pharmacy. Pt was advised to call her pharmacy.   Pt was advised to empty her voice mail box to make sure to have space for urgent message. Pt was very appreciative.        Ivory Broad, MD  Rolland Bimler, RN  Caller: Unspecified (3 days ago, 3:48 PM)  I put in a prescription for metronidazole 500mg  twice daily for 5 days. I tried to call the patient just now to let her know but she did not answer and mailbox was full, can you contact her to let her know when you get a chance?   pharmacy, pt's insurance doesn't cover bismuth subsalicylate. PA is needed or change to different medication.         ----- Message from Mathis Fare sent at 02/04/2021  2:53 PM PDT -----  Regarding: Medical advice  Contact: [phone]  Hello [phone],    Pt called very upset regarding the medication was sent to her pharmacy, pt does not know the name of it. She was hoping to get an abx but what was sent the pharmacy was the OTC medication.Would like to speak with Dr. Larita Fife regrading this matter. She is very concerned of her anus smells. Please advice.       Thank you!    [name]

## 2021-02-22 MED ORDER — POLYETHYLENE GLYCOL 3350 17 GRAM/DOSE ORAL POWDER
17 | ORAL | 3 refills | Status: DC
Start: 2021-02-22 — End: 2022-09-19

## 2021-03-23 MED ORDER — MESALAMINE 1.2 GRAM TABLET,DELAYED RELEASE
1.2 | ORAL | 1 refills | 60.00000 days | Status: DC
Start: 2021-03-23 — End: 2021-07-04

## 2021-03-30 ENCOUNTER — Ambulatory Visit: Admit: 2021-03-30 | Payer: MEDICAID | Attending: Physician | Primary: Physician

## 2021-03-30 DIAGNOSIS — E2740 Unspecified adrenocortical insufficiency: Secondary | ICD-10-CM

## 2021-03-30 MED ORDER — HYDROCORTISONE 5 MG TABLET
5 | ORAL_TABLET | ORAL | 2 refills | 7.00000 days | Status: AC
Start: 2021-03-30 — End: 2021-06-15

## 2021-03-30 MED ORDER — ESOMEPRAZOLE MAGNESIUM 40 MG CAPSULE,DELAYED RELEASE
40 mg | ORAL_CAPSULE | Freq: Every morning | ORAL | 3 refills | Status: DC
Start: 2021-03-30 — End: 2022-03-09

## 2021-03-30 NOTE — Progress Notes (Signed)
ENDOCRINOLOGY CLINIC NEW PATIENT NOTE    History of Present Illness     Patient ID: Chelsea Ball is a 60 y.o. woman who is referred for adrenal insufficiency.    eConsult by Dr. Smith Robert 11/04/20 reviewed    She has followed with [facility] Dr. [name] since 2020. She has a hx of irritable bowel syndrome, diverticulitis, cholecystectomy, and was referred for abdominal pain. 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 She subsequently developed significant fatigue, and  had labs checked which showed:    12/08/20 (746am) - ACTH < 5, cortisol 0.5    After these results, she was started on hydrocortisone 10/5. She feels her energy level has slightly improved, but is still continuing to feel overall fatigued and having difficulty sustaining energy throughout the day. She takes 10mg  around 10am, and then 5mg  around 1pm. We discussed more optimal timing of hydrocortisone (e.g. 8-9am and 3-4pm), as well as stress dosing and obtaining a medical alert bracelet (provided order form for the bracelet). Also discussed the diagnosis of adrenal insufficiency and importance of consistently taking hydrocortisone.     She feels her heartburn has worsened recently and she is out of Nexium. Is having difficulty figuring out the right dosing of laxative to balance her constipation/diarrhea. No current abd pain, n/v, or lightheadedness. She is going on a cruise starting at the end of January 2023.    No headaches, no hx of immunotherapy/checkpoint-inhibitor treatments.    Past Medical History:  Past Medical History:   Diagnosis Date    Abdominal pain 01/21/2019    Bipolar 1 disorder (CMS code) 01/21/2019    Depression 01/21/2019    GERD (gastroesophageal reflux disease) 05/04/2016       Past Surgical History:  Past  Surgical History:   Procedure Laterality Date    CHOLECYSTECTOMY  04/26/2017    HYSTERECTOMY      right open carpal tunnel release (Right Wrist)  05/15/2018       Medications:  Medication list reviewed with patient and updated where appropriate.     Current Outpatient Medications:     albuterol 90 mcg/actuation metered dose inhaler, Inhale into the lungs every 6 (six) hours as needed for Wheezing, Disp: , Rfl:     beclomethasone (QVAR REDIHALER) 80 mcg/actuation breath activated inhaler, TAKE 2 PUFFS BY MOUTH TWICE A DAY, Disp: , Rfl:     bismuth subsalicylate 262 mg TAB, Take 1 tablet by mouth 2 (two) times daily as needed (stool change), Disp: 60 tablet, Rfl: 0    budesonide (ENTOCORT EC) 3 mg 24 hr capsule, TAKE 2 CAPSULES BY MOUTH EVERY MORNING, Disp: 60 capsule, Rfl: 1    calcium carbonate-vitamin D 600 mg(1,500mg ) -400 unit tablet, TAKE 1 TABLET BY MOUTH EVERY DAY, Disp: , Rfl:     esomeprazole (NEXIUM) 40 mg capsule, Take 40 mg by mouth daily as needed, Disp: , Rfl:     estrogen, conjugated,-medroxyPROGESTERone (PREMPRO) 0.625-2.5 mg tablet, Take 1 tablet by mouth daily, Disp: , Rfl:     HYDROcodone-acetaminophen (NORCO) 5-325 mg tablet, Take 1 tablet by mouth, Disp: , Rfl:     hydrocortisone (CORTEF) 5 mg tablet, Take 2 tablets (10 mg total) by mouth every morning AND 1 tablet (5 mg total) every afternoon., Disp: 270 tablet, Rfl: 2  hydrOXYzine (ATARAX) 25 mg tablet, Take 25 mg by mouth every 6 (six) hours as needed, Disp: , Rfl:     Lactobacillus acidophilus (PROBIOTIC ORAL), Take by mouth daily, Disp: , Rfl:     lamoTRIgine (LAMICTAL) 100 mg tablet, Take by mouth daily, Disp: , Rfl:     loratadine (CLARITIN) 10 mg tablet, Take 10 mg by mouth daily, Disp: , Rfl:     ondansetron (ZOFRAN) 8 mg tablet, Take 8 mg po every 6- 8 hours up to two doses, as needed for nausea related to colonoscopy prep, Disp: 2 tablet, Rfl: 0    polyethylene glycol (MIRALAX) 17 gram/dose powder, DISSOLVE 17 GRAMS  INTO WATER AND DRINK BY MOUTH EVERY DAY, Disp: 510 g, Rfl: 3    pregabalin (LYRICA) 75 mg capsule, Take 75 mg by mouth daily as needed, Disp: , Rfl:     simethicone (MYLICON) 125 mg chewable tablet, PLEASE PURCHASE OVER THE COUNTER. PLEASE TAKE 3 TABLETS (approx. 400 mg) THE NIGHT BEFORE THE PROCEDURE and 3 TABLETS (approx. 400 mg) THE MORNING OF WHILE DRINKING THE BOWEL PREP., Disp: 6 tablet, Rfl: 0    mesalamine (LIALDA) 1.2 gram EC tablet, TAKE 1 TABLET BY MOUTH EVERY DAY, Disp: 30 tablet, Rfl: 1    Allergies:  Allergies/Contraindications  No Known Allergies    Family History:  Family History   Problem Relation Name Age of Onset    Malig hyperten Neg Hx      Malig hypertherm Neg Hx         Social History:  Social Documentation     No social documentation on file.          Review of Systems:  GENERAL, CONSTITUTIONAL: Denies unintentional weight changes, fever, chills, fatigue  EYES: No visual changes  EARS, NOSE, THROAT: Denies hearing loss, ear drainage, ear pain, nasal discharge, sore throat, changes in voice, dysphagia, odynophagia, neck pain    All other ROS were reviewed and negative except as stated in HPI    Objective   Physical Examination:  BP 108/65 (BP Location: Left upper arm, Patient Position: Sitting, Cuff Size: Adult)   Pulse 82   Ht 154.9 cm (5\' 1" )   Wt 64.4 kg (142 lb)   BMI 26.83 kg/m   Body mass index is 26.83 kg/m.    Constitutional: awake, alert, no acute distress   HENT:  normocephalic, atraumatic  Eyes: no scleral icterus, conjunctiva normal, no discharge  RESP: on room air, no respiratory distress  NEURO: alert and oriented, answering questions appropriately  PSYCH: Affect and behavior appropriate    Labs were personally reviewed, notable results are listed in HPI and/or Assessment/Plan sections  Imaging was personally reviewed, notable results are listed in HPI and/or Assessment/Plan sections      Assessment/Plan:   Chelsea Ball is a 60 y.o. woman with hx SCAD s/p treatment  with budesonide from 06/2019-09/2020, stopped budesonide and subsequently developed fatigue, labs c/w central adrenal insufficiency.     # Adrenal insufficiency - likely a sequelae of chronic budesonide tx for ~15 months given time course and onset of sx after budesonide cessation. Appears to have intermittently been on hydrocodone, but unlikely to have been at high enough doses to cause central adrenal insufficiency. No checkpoint-inhibitor tx. No other sx to suggest pituitary mass or any additional pituitary hormonal deficiency, but will evaluate pituitary axes and also obtain pituitary MRI to be certain. As she is still somewhat fatigued, will slightly increase hydrocortisone, though her fatigue may be  from a separate etiology as she has been on typically adequate hydrocortisone dosing  - increase hydrocortisone 10/5 --> 15/5 (disussed optimal timing of administration at 8-9am and 3-4pm with patient)  - discussed stress/illness dosing and provided instructions on obtaining medical alert bracelet  - repeat 8am ACTH/cortisol (provided instructions on holding PM hydrocortisone dose on the day prior to labs, and taking AM hydrocortisone dose after labs are obtained in the morning) to evaluate for any endogenous recovery  - check TSH, fT4, prolactin, IGF-1 to evaluate for any evidence of additional pituitary dysfunction  - check pituitary MRI  - agree with obtaining DXA scan given prior high-dose glucocorticoid tx    Dispo: Return to clinic in 6 months     I spent a total of 65 minutes on this patient's care on the day of their visit. This time includes time spent with the patient as well as time spent documenting in the medical record, reviewing patient's records and tests, obtaining history, placing orders, communicating with other healthcare professionals, counseling the patient, family, or caregiver, and/or care coordination for the diagnoses above.    Merryl Hacker, MD  Endocrinology

## 2021-05-17 NOTE — Telephone Encounter (Signed)
Patient urgent request: called concerned about having anxiety preparing for the MRI patient stated claustrophobic will need to take some type of medication to calm her down or else she will not be able to do the MRI. Request for Dr. Murvin Natal to call her Mobile Phone:  [phone] to discuss this matter.
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
## 2021-05-18 MED ORDER — LORAZEPAM 0.5 MG TABLET
0.5 | Freq: Once | ORAL | Status: DC | PRN
Start: 2021-05-18 — End: 2021-05-18

## 2021-05-18 MED ORDER — LORAZEPAM 0.5 MG TABLET
0.5 | ORAL_TABLET | ORAL | 0 refills | 10.00000 days | Status: DC | PRN
Start: 2021-05-18 — End: 2022-09-19

## 2021-05-18 NOTE — Progress Notes (Signed)
Pt with claustrophobia and says she would be unable to complete the MRI without pre-medication. Will write lorazepam 0.5mg  tablet x2 prn to take prior to MRI. l

## 2021-05-23 NOTE — Telephone Encounter (Signed)
Labs reviewed. Consistent w/ continued adrenal insufficiency (ACTH still pending but cortisol undetectable) - will continue hydrocortisone. Labs also show mild subclinical hypothyroidism - will re-evaluate at next visit, no urgent indication to begin treatment given TSH only mildly elevated to 5.3.    Will follow up results of pituitary MRI once completed.

## 2021-05-24 ENCOUNTER — Inpatient Hospital Stay: Admit: 2021-05-24 | Discharge: 2021-06-02 | Payer: MEDICAID | Primary: Physician

## 2021-05-24 DIAGNOSIS — E2740 Unspecified adrenocortical insufficiency: Secondary | ICD-10-CM

## 2021-05-24 MED ORDER — GADOTERATE MEGLUMINE 0.5 MMOL/ML (376.9 MG/ML) INTRAVENOUS SOLUTION
0.5 | Freq: Once | INTRAVENOUS | Status: AC
Start: 2021-05-24 — End: 2021-05-24
  Administered 2021-05-24: 18:00:00 13 mL/kg via INTRAVENOUS

## 2021-05-26 LAB — IGF-1, LC/MS
IGF-1, Adult: 79 ng/mL (ref 50–317)
Z-Score (Female): -1 SD (ref ?–2.0)

## 2021-05-26 LAB — FREE T4: Free T4: 0.9 ng/dL (ref 0.8–1.8)

## 2021-05-26 LAB — ADRENOCORTICOTROPIC HORMONE
ACTH, Plasma: <5 pg/mL — ABNORMAL LOW (ref 6–50)
ACTH, Plasma: <5 pg/mL — ABNORMAL LOW (ref 6–50)

## 2021-05-26 LAB — THYROID STIMULATING HORMONE: Thyroid Stimulating Hormone: 5.36 mIU/L — ABNORMAL HIGH (ref 0.40–4.50)

## 2021-05-26 LAB — PROLACTIN: Prolactin: 9.7 ng/mL

## 2021-05-26 LAB — CORTISOL, SERUM: Cortisol, serum: <0.5 ug/dL — ABNORMAL LOW

## 2021-05-26 LAB — CORTISOL, A.M.: Cortisol, serum: <0.5 ug/dL — ABNORMAL LOW

## 2021-06-15 MED ORDER — HYDROCORTISONE 5 MG TABLET
5 | ORAL_TABLET | ORAL | 1 refills | 7.00000 days | Status: DC
Start: 2021-06-15 — End: 2021-10-13

## 2021-06-15 MED ORDER — METRONIDAZOLE 500 MG TABLET
500 | ORAL_TABLET | Freq: Two times a day (BID) | ORAL | 0 refills | Status: AC
Start: 2021-06-15 — End: 2021-06-18

## 2021-06-15 NOTE — Telephone Encounter (Signed)
Called pt to discuss her questions. Pt said in the past she had a few episodes of fishy/foul odor BMs, then she was prescribed antibiotic (Flagyl?) and it went away.     Pt requested to antibiotic prescription.

## 2021-06-15 NOTE — Telephone Encounter (Signed)
Called pt to relay GI provider's advice.   Flagyl 500mg  two times a day for 3 days was prescribed. Prescription was sent to pt's pharmacy.     Pt was advised to keep [place] updated if symptoms are improved or get worse or new symptoms.     Pt verbalized understanding and appreciation.

## 2021-06-15 NOTE — Telephone Encounter (Signed)
-----   Message from [facility] sent at 06/14/2021 12:31 PM PST -----  Regarding: call back  Contact: [place],    Patient would like to receive a call from the care team, most especially Dr. Chalmers Cater.  For the last 3 days, while having a bowel movement. She is concerned about a fishy/foul odor "coal" looking like/small feces when going. She just doesn't want to be prescribe medication without explanation.     Thanks,  Sharyn Lull

## 2021-07-04 MED ORDER — MESALAMINE 1.2 GRAM TABLET,DELAYED RELEASE
1.2 | ORAL | 1 refills | 60.00000 days | Status: AC
Start: 2021-07-04 — End: 2021-08-29

## 2021-08-09 ENCOUNTER — Other Ambulatory Visit: Payer: Self-pay | Admitting: Internal Medicine

## 2021-08-09 DIAGNOSIS — R600 Localized edema: Secondary | ICD-10-CM

## 2021-08-17 ENCOUNTER — Ambulatory Visit: Payer: BC Managed Care – PPO

## 2021-08-22 ENCOUNTER — Ambulatory Visit
Admission: RE | Admit: 2021-08-22 | Discharge: 2021-08-22 | Disposition: A | Discharge status: Home / Self Care | Payer: BC Managed Care – PPO | Source: Ambulatory Visit | Attending: Family Medicine | Admitting: Family Medicine

## 2021-08-22 ENCOUNTER — Other Ambulatory Visit (HOSPITAL_COMMUNITY): Payer: Self-pay | Admitting: Family Medicine

## 2021-08-22 ENCOUNTER — Other Ambulatory Visit: Payer: Self-pay | Admitting: Family Medicine

## 2021-08-22 DIAGNOSIS — H11423 Conjunctival edema, bilateral: Secondary | ICD-10-CM | POA: Insufficient documentation

## 2021-08-22 DIAGNOSIS — H109 Unspecified conjunctivitis: Secondary | ICD-10-CM | POA: Diagnosis present

## 2021-08-22 DIAGNOSIS — R509 Fever, unspecified: Secondary | ICD-10-CM

## 2021-08-22 MED ORDER — IOHEXOL 300 MG/ML  SOLN
100.0000 mL | Freq: Once | INTRAMUSCULAR | Status: AC | PRN
  Administered 2021-08-22: 100 mL via INTRAVENOUS

## 2021-08-26 ENCOUNTER — Other Ambulatory Visit: Payer: Self-pay | Admitting: Internal Medicine

## 2021-08-26 DIAGNOSIS — R600 Localized edema: Secondary | ICD-10-CM

## 2021-08-29 ENCOUNTER — Other Ambulatory Visit: Payer: Self-pay | Admitting: Internal Medicine

## 2021-08-29 DIAGNOSIS — R600 Localized edema: Secondary | ICD-10-CM

## 2021-08-29 MED ORDER — MESALAMINE 1.2 GRAM TABLET,DELAYED RELEASE
1.2 | ORAL_TABLET | ORAL | 1 refills | 60.00000 days | Status: DC
Start: 2021-08-29 — End: 2021-10-04

## 2021-09-02 ENCOUNTER — Other Ambulatory Visit: Payer: Self-pay | Admitting: Internal Medicine

## 2021-09-02 DIAGNOSIS — R600 Localized edema: Secondary | ICD-10-CM

## 2021-09-06 ENCOUNTER — Other Ambulatory Visit: Payer: BC Managed Care – PPO

## 2021-09-07 NOTE — Progress Notes (Unsigned)
ENDOCRINOLOGY CLINIC NEW PATIENT NOTE    History of Present Illness     Patient ID: Chelsea Ball is a 61 y.o. woman who is referred for adrenal insufficiency.    Last visit: 03/30/2021 w/Dr. Bufford ButtnerMehta      Brief Endocrine history   (from previous visit)  Chelsea Ball has followed with [facility] Dr. Nathanial RancherSelvig since 2020. She has a hx of irritable bowel syndrome, diverticulitis, cholecystectomy, and was referred for abdominal pain. 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 She subsequently developed significant fatigue, and  had labs checked which showed:    12/08/20 (0746am) - ACTH < 5, cortisol 0.5    After those results, she was started on hydrocortisone 10/5. She felt her energy level slightly improved, but continued to feel overall fatigued w/difficulty sustaining energy throughout the day. She takes 10mg  around 10am, and then 5mg  around 1pm. We discussed more optimal timing of hydrocortisone (e.g. 8-9am and 3-4pm), as well as stress dosing and obtaining a medical alert bracelet (provided order form for the bracelet). Also discussed the diagnosis of adrenal insufficiency and importance of consistently taking hydrocortisone.       Past Medical History:  Past Medical History:   Diagnosis Date    Abdominal pain 01/21/2019    Bipolar 1 disorder (CMS code) 01/21/2019    Depression 01/21/2019    GERD (gastroesophageal reflux disease) 05/04/2016       Past Surgical History:  Past Surgical History:   Procedure Laterality Date    CHOLECYSTECTOMY  04/26/2017    HYSTERECTOMY      right open carpal tunnel release (Right Wrist)  05/15/2018       Medications:  Medication list reviewed with patient and updated where appropriate.     Current Outpatient Medications:     albuterol 90 mcg/actuation metered  dose inhaler, Inhale into the lungs every 6 (six) hours as needed for Wheezing, Disp: , Rfl:     beclomethasone (QVAR REDIHALER) 80 mcg/actuation breath activated inhaler, TAKE 2 PUFFS BY MOUTH TWICE A DAY, Disp: , Rfl:     bismuth subsalicylate 262 mg TAB, Take 1 tablet by mouth 2 (two) times daily as needed (stool change), Disp: 60 tablet, Rfl: 0    budesonide (ENTOCORT EC) 3 mg 24 hr capsule, TAKE 2 CAPSULES BY MOUTH EVERY MORNING, Disp: 60 capsule, Rfl: 1    calcium carbonate-vitamin D 600 mg(1,500mg ) -400 unit tablet, TAKE 1 TABLET BY MOUTH EVERY DAY, Disp: , Rfl:     esomeprazole (NEXIUM) 40 mg capsule, Take 1 capsule (40 mg total) by mouth every morning before breakfast, Disp: 90 capsule, Rfl: 3    estrogen, conjugated,-medroxyPROGESTERone (PREMPRO) 0.625-2.5 mg tablet, Take 1 tablet by mouth daily, Disp: , Rfl:     HYDROcodone-acetaminophen (NORCO) 5-325 mg tablet, Take 1 tablet by mouth, Disp: , Rfl:     hydrocortisone (CORTEF) 5 mg tablet, TAKE 2 TABLETS (10 MG TOTAL) BY MOUTH EVERY MORNING AND 1 TABLET (5 MG TOTAL) EVERY AFTERNOON., Disp: 270 tablet, Rfl: 1    hydrOXYzine (ATARAX) 25 mg tablet, Take 25 mg by mouth every 6 (six) hours as needed, Disp: , Rfl:     Lactobacillus acidophilus (PROBIOTIC ORAL), Take by mouth daily, Disp: , Rfl:     lamoTRIgine (LAMICTAL) 100 mg tablet, Take by mouth daily,  Disp: , Rfl:     loratadine (CLARITIN) 10 mg tablet, Take 10 mg by mouth daily, Disp: , Rfl:     LORazepam (ATIVAN) 0.5 mg tablet, Take 1 tablet (0.5 mg total) by mouth every 30 (thirty) minutes as needed for Anxiety (prior to imaging study or procedure) for up to 2 doses, Disp: 2 tablet, Rfl: 0    mesalamine (LIALDA) 1.2 gram EC tablet, TAKE 1 TABLET BY MOUTH EVERY DAY, Disp: 30 tablet, Rfl: 1    ondansetron (ZOFRAN) 8 mg tablet, Take 8 mg po every 6- 8 hours up to two doses, as needed for nausea related to colonoscopy prep, Disp: 2 tablet, Rfl: 0    polyethylene glycol (MIRALAX) 17 gram/dose  powder, DISSOLVE 17 GRAMS INTO WATER AND DRINK BY MOUTH EVERY DAY, Disp: 510 g, Rfl: 3    pregabalin (LYRICA) 75 mg capsule, Take 75 mg by mouth daily as needed, Disp: , Rfl:     simethicone (MYLICON) 125 mg chewable tablet, PLEASE PURCHASE OVER THE COUNTER. PLEASE TAKE 3 TABLETS (approx. 400 mg) THE NIGHT BEFORE THE PROCEDURE and 3 TABLETS (approx. 400 mg) THE MORNING OF WHILE DRINKING THE BOWEL PREP., Disp: 6 tablet, Rfl: 0    Allergies:  Allergies/Contraindications  No Known Allergies    Family History:  Family History   Problem Relation Name Age of Onset    Malig hyperten Neg Hx      Malig hypertherm Neg Hx         Social History:  Social Documentation     No social documentation on file.          Review of Systems:  GENERAL, CONSTITUTIONAL: Denies unintentional weight changes, fever, chills, fatigue  EYES: No visual changes  EARS, NOSE, THROAT: Denies hearing loss, ear drainage, ear pain, nasal discharge, sore throat, changes in voice, dysphagia, odynophagia, neck pain    All other ROS were reviewed and negative except as stated in HPI    Objective   Physical Examination:  There were no vitals taken for this visit.  There is no height or weight on file to calculate BMI.    Constitutional: awake, alert, no acute distress   HENT:  normocephalic, atraumatic  Eyes: no scleral icterus, conjunctiva normal, no discharge  RESP: on room air, no respiratory distress  NEURO: alert and oriented, answering questions appropriately  PSYCH: Affect and behavior appropriate    Labs were personally reviewed, notable results are listed in HPI and/or Assessment/Plan sections    Imaging was personally reviewed, notable results are listed in HPI and/or Assessment/Plan sections    BRAIN MRI 05/24/2021     FINDINGS:    Normal sella. No focal pituitary hypoenhancement to suggest underlying pituitary adenoma. The infundibulum is midline. Normal optic chiasm and cavernous sinuses.    No acute hemorrhage. No herniation.  Periventricular and deep white matter foci of T2/FLAIR signal hyperintensity, nonspecific, likely related to chronic microvascular ischemic change.    Visualized ventricles within normal limits of size for age.     IMPRESSION:     Normal pituitary gland. No evidence of pituitary adenoma.       Assessment/Plan:   Chelsea Ball is a 61 y.o. woman with hx SCAD s/p treatment with budesonide from 06/2019-09/2020, stopped budesonide and subsequently developed fatigue, labs c/w central adrenal insufficiency.     #Adrenal insufficiency - likely a sequelae of chronic budesonide tx for ~15 months given time course and onset of sx after budesonide cessation. Appears to have intermittently  been on hydrocodone, but unlikely to have been at high enough doses to cause central adrenal insufficiency. No checkpoint-inhibitor tx. No other sx to suggest pituitary mass or any additional pituitary hormonal deficiency, but will evaluate pituitary axes and also obtain pituitary MRI to be certain.   - At last visit, Dr. Bufford Buttner slightly increased Hydrocortisone due to ongoing fatigue. He increased hydrocortisone 10/5 --> 15/5     - pituitary MRI completed w/no e/o pituitary adenoma    - discussed stress/illness dosing and provided instructions on obtaining medical alert bracelet  -Follow up plan:  - repeat labs 8am ACTH/cortisol (provided instructions on holding PM hydrocortisone dose on the day prior to labs, and taking AM hydrocortisone dose after labs are obtained in the morning) to evaluate for any endogenous recovery  - check TSH, fT4, prolactin, IGF-1 to evaluate for any evidence of additional pituitary dysfunction    - agree with obtaining DXA scan given prior high-dose glucocorticoid tx    Dispo: Return to clinic in 6 months

## 2021-09-14 ENCOUNTER — Ambulatory Visit: Admit: 2021-09-14 | Payer: Medicaid Other | Attending: Nurse Practitioner | Primary: Physician

## 2021-09-14 DIAGNOSIS — E2740 Unspecified adrenocortical insufficiency: Secondary | ICD-10-CM

## 2021-09-14 MED ORDER — CONJUGATED ESTROGENS 0.625 MG TABLET: 0.625 mg | ORAL | Status: DC

## 2021-09-14 MED ORDER — OXYBUTYNIN CHLORIDE ER 10 MG TABLET,EXTENDED RELEASE 24 HR
10 | Freq: Every day | ORAL | 2.00 refills | 20.00000 days | Status: DC
Start: 2021-09-14 — End: 2023-07-24

## 2021-09-14 NOTE — Progress Notes (Incomplete)
ENDOCRINOLOGY CLINIC NEW PATIENT NOTE    History of Present Illness     Patient ID: Chelsea Ball is a 61 y.o. woman who is referred for adrenal insufficiency.    Last visit: 03/30/2021 w/Dr. Bufford Buttner    Brief Endocrine history   (from previous visit)  Ms. Chelsea Ball has followed with [facility] Dr. [name] since 2020. She has a hx of irritable bowel syndrome, diverticulitis, cholecystectomy, and was referred for abdominal pain. 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 She subsequently developed significant fatigue, and  had labs checked which showed:    12/08/20 (0746am) - ACTH < 5, cortisol 0.5    After those results, she was started on hydrocortisone 10/5. She felt her energy level slightly improved, but continued to feel overall fatigued w/difficulty sustaining energy throughout the day. She takes 10mg  around 10am, and then 5mg  around 1pm. We discussed more optimal timing of hydrocortisone (e.g. 8-9am and 3-4pm), as well as stress dosing and obtaining a medical alert bracelet (provided order form for the bracelet). Also discussed the diagnosis of adrenal insufficiency and importance of consistently taking hydrocortisone.       Past Medical History:  Past Medical History:   Diagnosis Date    Abdominal pain 01/21/2019    Bipolar 1 disorder (CMS code) 01/21/2019    Depression 01/21/2019    GERD (gastroesophageal reflux disease) 05/04/2016       Past Surgical History:  Past Surgical History:   Procedure Laterality Date    CHOLECYSTECTOMY  04/26/2017    HYSTERECTOMY      right open carpal tunnel release (Right Wrist)  05/15/2018       Medications:  Medication list reviewed with patient and updated where appropriate.     Current Outpatient Medications:     albuterol 90 mcg/actuation metered  dose inhaler, Inhale into the lungs every 6 (six) hours as needed for Wheezing, Disp: , Rfl:     beclomethasone (QVAR REDIHALER) 80 mcg/actuation breath activated inhaler, TAKE 2 PUFFS BY MOUTH TWICE A DAY, Disp: , Rfl:     bismuth subsalicylate 262 mg TAB, Take 1 tablet by mouth 2 (two) times daily as needed (stool change), Disp: 60 tablet, Rfl: 0    budesonide (ENTOCORT EC) 3 mg 24 hr capsule, TAKE 2 CAPSULES BY MOUTH EVERY MORNING, Disp: 60 capsule, Rfl: 1    calcium carbonate-vitamin D 600 mg(1,500mg ) -400 unit tablet, TAKE 1 TABLET BY MOUTH EVERY DAY, Disp: , Rfl:     esomeprazole (NEXIUM) 40 mg capsule, Take 1 capsule (40 mg total) by mouth every morning before breakfast, Disp: 90 capsule, Rfl: 3    estrogen, conjugated,-medroxyPROGESTERone (PREMPRO) 0.625-2.5 mg tablet, Take 1 tablet by mouth daily, Disp: , Rfl:     HYDROcodone-acetaminophen (NORCO) 5-325 mg tablet, Take 1 tablet by mouth, Disp: , Rfl:     hydrocortisone (CORTEF) 5 mg tablet, TAKE 2 TABLETS (10 MG TOTAL) BY MOUTH EVERY MORNING AND 1 TABLET (5 MG TOTAL) EVERY AFTERNOON., Disp: 270 tablet, Rfl: 1    hydrOXYzine (ATARAX) 25 mg tablet, Take 25 mg by mouth every 6 (six) hours as needed, Disp: , Rfl:     Lactobacillus acidophilus (PROBIOTIC ORAL), Take by mouth daily, Disp: , Rfl:     lamoTRIgine (LAMICTAL) 100 mg tablet, Take by mouth daily, Disp: ,  Rfl:     loratadine (CLARITIN) 10 mg tablet, Take 10 mg by mouth daily, Disp: , Rfl:     LORazepam (ATIVAN) 0.5 mg tablet, Take 1 tablet (0.5 mg total) by mouth every 30 (thirty) minutes as needed for Anxiety (prior to imaging study or procedure) for up to 2 doses, Disp: 2 tablet, Rfl: 0    mesalamine (LIALDA) 1.2 gram EC tablet, TAKE 1 TABLET BY MOUTH EVERY DAY, Disp: 30 tablet, Rfl: 1    ondansetron (ZOFRAN) 8 mg tablet, Take 8 mg po every 6- 8 hours up to two doses, as needed for nausea related to colonoscopy prep, Disp: 2 tablet, Rfl: 0    polyethylene glycol (MIRALAX) 17 gram/dose  powder, DISSOLVE 17 GRAMS INTO WATER AND DRINK BY MOUTH EVERY DAY, Disp: 510 g, Rfl: 3    pregabalin (LYRICA) 75 mg capsule, Take 75 mg by mouth daily as needed, Disp: , Rfl:     simethicone (MYLICON) 125 mg chewable tablet, PLEASE PURCHASE OVER THE COUNTER. PLEASE TAKE 3 TABLETS (approx. 400 mg) THE NIGHT BEFORE THE PROCEDURE and 3 TABLETS (approx. 400 mg) THE MORNING OF WHILE DRINKING THE BOWEL PREP., Disp: 6 tablet, Rfl: 0    Allergies:  Allergies/Contraindications  No Known Allergies      Review of Systems:  GENERAL, CONSTITUTIONAL: Denies unintentional weight changes, fever, chills, fatigue  EYES: No visual changes  EARS, NOSE, THROAT: Denies hearing loss, ear drainage, ear pain, nasal discharge, sore throat, changes in voice, dysphagia, odynophagia, neck pain    All other ROS were reviewed and negative except as stated in HPI    Objective   Physical Examination:  There were no vitals taken for this visit.  There is no height or weight on file to calculate BMI.    Constitutional: awake, alert, no acute distress   HENT:  normocephalic, atraumatic  Eyes: no scleral icterus, conjunctiva normal, no discharge  RESP: on room air, no respiratory distress  NEURO: alert and oriented, answering questions appropriately  PSYCH: Affect and behavior appropriate    Labs were personally reviewed, notable results are listed in HPI and/or Assessment/Plan sections      Imaging was personally reviewed, notable results are listed in HPI and/or Assessment/Plan sections    BRAIN MRI 05/24/2021     FINDINGS:    Normal sella. No focal pituitary hypoenhancement to suggest underlying pituitary adenoma. The infundibulum is midline. Normal optic chiasm and cavernous sinuses.    No acute hemorrhage. No herniation. Periventricular and deep white matter foci of T2/FLAIR signal hyperintensity, nonspecific, likely related to chronic microvascular ischemic change.    Visualized ventricles within normal limits of size for age.      IMPRESSION:     Normal pituitary gland. No evidence of pituitary adenoma.       Assessment/Plan:   Chelsea Ball is a 61 y.o. woman with hx SCAD s/p treatment with budesonide from 06/2019-09/2020, stopped budesonide and subsequently developed fatigue, labs c/w central adrenal insufficiency.     #Adrenal insufficiency - likely a sequelae of chronic budesonide tx for ~15 months given time course and onset of sx after budesonide cessation. Appears to have intermittently been on hydrocodone, but unlikely to have been at high enough doses to cause central adrenal insufficiency. No checkpoint-inhibitor tx. No other sx to suggest pituitary mass or any additional pituitary hormonal deficiency, but will evaluate pituitary axes and also obtain pituitary MRI to be certain.   - At last visit, Dr. Bufford Buttner slightly increased Hydrocortisone due  to ongoing fatigue. He increased hydrocortisone 10/5 --> 15/5     - pituitary MRI completed w/no e/o pituitary adenoma    - discussed stress/illness dosing and provided instructions on obtaining medical alert bracelet  -Follow up plan:  - repeat labs 8am ACTH/cortisol (provided instructions on holding PM hydrocortisone dose on the day prior to labs, and taking AM hydrocortisone dose after labs are obtained in the morning) to evaluate for any endogenous recovery  - check TSH, fT4, prolactin, IGF-1 to evaluate for any evidence of additional pituitary dysfunction    - agree with obtaining DXA scan given prior high-dose glucocorticoid tx    Dispo: Return to clinic in 6 months

## 2021-09-14 NOTE — Patient Instructions (Addendum)
After Your Visit with [facility]     It was a pleasure meeting you today. Please have your labs drawn at  8am ACTH/cortisol, DO NOTE TAKE the afternoon hydrocortisone dose on the day prior to labs, or morning of. Once your labs are drawn, you can then take the AM hydrocortisone dose. Labs have been ordered @ [facility].     I will schedule to see Dr. Murvin Natal once those labs are completed.     Please call radiology at [phone] to schedule your DEXA bone density scan.     If you have any difficulties with scheduling your appointment, our clinic staff can be reached by calling [phone] and our clinic fax number is [phone].    Thank you!  Hermenia Fiscal. Ouida Sills, NP  [facility]      ADRENAL INSUFFICIENCY  WHAT YOU NEED TO KNOW  Emergency Toolkit    Adrenal insufficiency is a condition where the body is unable to produce a normal supply of the hormone cortisol, and sometimes also can't produce the hormone aldosterone.  These hormones control blood pressure and metabolism. Symptoms of adrenal insufficiency include feeling tired, dizzy, nauseated, feverish, and having belly pain.  Some people with adrenal insufficiency may also notice salt craving and darkening of the skin.  With proper treatment, the symptoms of adrenal insufficiency will not be present very often.    Treatment of adrenal insufficiency involves taking steroid hormone pills to replace cortisol (and aldosterone if needed).  Cortisol is replaced with prednisone or hydrocortisone.  Aldosterone is replaced with fludrocortisone.Adrenal Insufficiency can be life-threatening if not treated appropriately. The following guidelines should help you with this:    Daily Treatment:  Take your prescribed medicines for adrenal insufficiency every day. Your dose of Hydrocortisone is 71m AM, 5 at night    Sick day Rules for patients on steroids:  If you have adrenal insufficiency and are sick, be aware that you will need increased doses of your  glucocorticoid (steroid) medication  If you are ill, for example- fever; illnesses for which you are on antibiotics such as pneumonia, urinary tract infections; diarrhea; nausea or vomiting- then you should double or triple the steroid dose (hydrocortisone, prednisone, or dexamethasone) while you are unwell.    Take a double dose for mild to moderate symptoms and a triple dose for severe symptoms   Contact your doctor if you are not better within 1 week. When you are better, return to your regular daily dose.   If you are unable to keep down the oral steroid medicine and keep having vomiting and diarrhea, then this is an emergency and you should go to the local ER. When you are sick, you should drink sugar and salt-containing liquids to help prevent dehydration    Other Precautions:  Educate family members and/or friends about your condition  Carry the medical emergency card (sent from our clinic) indicating you have adrenal insufficiency  Additionally, you can wear a medical alert bracelet or necklace indicating you have adrenal insufficiency  If you are having an outpatient procedure such as colonoscopy, barium enema, arteriography, or minor surgery, then talk to the doctors performing the procedure about the steroids. Usually, you should double up the steroid on the day of the procedure and if you are back to normal, then can go back to your regular dose the following day.    Be aware of the following signs and symptoms of acute glucocorticoid deficiency:  Profound fatigue  Nausea/vomiting  Light  headedness  Poor appetite/weight loss  Abdominal pain, loose stool    Below is a suggested list of important supplies that you may need in an emergency:  [  ]  Sufficient supply of hydrocortisone  [  ]  If you have primary adrenal insufficiency, sufficient supply of fludrocortisone   [  ]  Based on your doctor's discretion: Solu-Cortef emergency injection kit including Solu-Cortef vial, syringes, needles, alcohol swabs    [  ]  Medical emergency card  [  ]  Medical alert bracelet or necklace    For further questions/concerns, please visit:  [email or web address]  [email or web address] [id number]

## 2021-09-28 LAB — CORTISOL, SERUM: Cortisol, serum: <0.5 ug/dL — ABNORMAL LOW

## 2021-09-28 LAB — VITAMIN D, 25-HYDROXY: Vitamin D, 25-Hydroxy: 20 ng/mL — ABNORMAL LOW (ref 30–100)

## 2021-09-28 LAB — ADRENOCORTICOTROPIC HORMONE: ACTH, Plasma: <5 pg/mL — ABNORMAL LOW (ref 6–50)

## 2021-09-28 LAB — THYROID STIMULATING HORMONE: Thyroid Stimulating Hormone: 2.37 mIU/L (ref 0.40–4.50)

## 2021-09-28 LAB — FREE T4: Free T4: 1.1 ng/dL (ref 0.8–1.8)

## 2021-10-04 MED ORDER — MESALAMINE 1.2 GRAM TABLET,DELAYED RELEASE
1.2 | ORAL_TABLET | ORAL | 1 refills | 60.00000 days | Status: DC
Start: 2021-10-04 — End: 2021-12-02

## 2021-10-13 ENCOUNTER — Telehealth: Admit: 2021-10-13 | Payer: MEDICAID | Attending: Nurse Practitioner | Primary: Physician

## 2021-10-13 DIAGNOSIS — E559 Vitamin D deficiency, unspecified: Secondary | ICD-10-CM

## 2021-10-13 MED ORDER — HYDROCORTISONE 5 MG TABLET
5 | ORAL_TABLET | Freq: Two times a day (BID) | ORAL | 1 refills | 7.00000 days | Status: DC
Start: 2021-10-13 — End: 2021-10-13

## 2021-10-13 MED ORDER — CHOLECALCIFEROL (VITAMIN D3) 25 MCG (1,000 UNIT) TABLET
1000 | Freq: Every day | ORAL | Status: DC
Start: 2021-10-13 — End: 2022-09-19

## 2021-10-13 MED ORDER — HYDROCORTISONE 5 MG TABLET
5 | ORAL_TABLET | ORAL | 11 refills | 7.00000 days | Status: DC
Start: 2021-10-13 — End: 2022-09-19

## 2021-10-13 NOTE — Patient Instructions (Addendum)
Great to see you Ms. Yeats.     Please decrease your Hydrocortisone to 2 tabs in the AM, and 1 tab in the early afternoon at 2pm as we discussed. In a few weeks, you can start to skip your afternoon dose here and there and see how you feel. Please return to see me in 3-4 months and have your labs drawn prior to the appointment just as you did this time. Remember to hold the afternoon before and morning of the lab draw.    If you have any difficulties with scheduling your appointment, our clinic staff can be reached by calling [phone] and our clinic fax number is [phone].      Thank you!  Hermenia Fiscal. Ouida Sills, NP  [facility]       ADRENAL INSUFFICIENCY  WHAT YOU NEED TO KNOW  Emergency Toolkit    Adrenal insufficiency is a condition where the body is unable to produce a normal supply of the hormone cortisol, and sometimes also can't produce the hormone aldosterone.  These hormones control blood pressure and metabolism. Symptoms of adrenal insufficiency include feeling tired, dizzy, nauseated, feverish, and having belly pain.  Some people with adrenal insufficiency may also notice salt craving and darkening of the skin.  With proper treatment, the symptoms of adrenal insufficiency will not be present very often.    Treatment of adrenal insufficiency involves taking steroid hormone pills to replace cortisol (and aldosterone if needed).  Cortisol is replaced with prednisone or hydrocortisone.  Aldosterone is replaced with fludrocortisone.Adrenal Insufficiency can be life-threatening if not treated appropriately. The following guidelines should help you with this:    Daily Treatment:  Take your prescribed medicines for adrenal insufficiency every day. Your dose is Hydrocortisone 42m in AM, 560min PM    Sick day Rules for patients on steroids:  If you have adrenal insufficiency and are sick, be aware that you will need increased doses of your glucocorticoid (steroid) medication  If you are ill,  for example- fever; illnesses for which you are on antibiotics such as pneumonia, urinary tract infections; diarrhea; nausea or vomiting- then you should double or triple the steroid dose (hydrocortisone, prednisone, or dexamethasone) while you are unwell.    Take a double dose for mild to moderate symptoms and a triple dose for severe symptoms   Contact your doctor if you are not better within 1 week. When you are better, return to your regular daily dose.   If you are unable to keep down the oral steroid medicine and keep having vomiting and diarrhea, then this is an emergency and you should go to the local ER. If your doctor has given you a prescription for the hydrocortisone injection (Solu-Cortef), then you should give yourself the injection (or have a family member give you the injection) and then go straight to the ER.   When you are sick, you should drink sugar and salt-containing liquids to help prevent dehydration    Other Precautions:  Educate family members and/or friends about your condition  Carry the medical emergency card (sent from our clinic) indicating you have adrenal insufficiency  Additionally, you can wear a medical alert bracelet or necklace indicating you have adrenal insufficiency  If you are having an outpatient procedure such as colonoscopy, barium enema, arteriography, or minor surgery, then talk to the doctors performing the procedure about the steroids. Usually, you should double up the steroid on the day of the procedure and if you are back to  normal, then can go back to your regular dose the following day.    Be aware of the following signs and symptoms of acute glucocorticoid deficiency:  Profound fatigue  Nausea/vomiting  Light headedness  Poor appetite/weight loss  Abdominal pain, loose stool    Below is a suggested list of important supplies that you may need in an emergency:  [  ]  Sufficient supply of hydrocortisone  [  ]  If you have primary adrenal insufficiency, sufficient  supply of fludrocortisone   [  ]  Based on your doctor's discretion: Solu-Cortef emergency injection kit including Solu-Cortef vial, syringes, needles, alcohol swabs   [  ]  Medical emergency card  [  ]  Medical alert bracelet or necklace    For further questions/concerns, please visit:  [email or web address]  [email or web address] [id number]

## 2021-10-13 NOTE — Telephone Encounter (Addendum)
Mailed lab order with a request for her to make an in person appointment in Oct, called, there was no answer and no voicemail set up.  Also texted her and emailed her a request to set up MyChart.    ----- Message from Calton Dach, NP sent at 10/13/2021  3:08 PM PDT -----  Hi,   Please send labs to patient (ordered to [facility]) for her to have drawn in 3 months.   Also, please send her an invitation to sign up for mychart (she really wants to sign up).   Please call to schedule followup with me in 3 months, she said she prefers in person for the next visit!    THank you!  Marisue Ivan
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
## 2021-12-02 NOTE — Telephone Encounter (Signed)
Reviewed chart.   Last visit:  02/28/2019  Last colonoscopy: 09/20/2020  GFR 04/21/2021: 103    Visit or procedure is needed for refill. It doesn't meet nursing refill protocol.

## 2021-12-07 MED ORDER — MESALAMINE 1.2 GRAM TABLET,DELAYED RELEASE
1.2 | ORAL_TABLET | ORAL | 1 refills | 60.00000 days | Status: DC
Start: 2021-12-07 — End: 2022-02-03

## 2021-12-13 MED ORDER — METRONIDAZOLE 500 MG TABLET
500 | ORAL_TABLET | Freq: Two times a day (BID) | ORAL | 0 refills | Status: AC
Start: 2021-12-13 — End: 2021-12-16

## 2021-12-13 NOTE — Telephone Encounter (Signed)
Pt called to ask if she could come in for a visit in person today.     Pt was last seen by GI 02/28/2019  Last EGD 11/23/2020  Last colo 09/20/2020    Triaged as following.   Pt had abdominal pain around to middle of abdomen 10 out of 10 on Saturday for about 3-4 hours. And it went away.     Her abdominal pain was better on Sunday and yesterday.     It gets worse again today. Abdominal pain is 8 out of 10. Pt denied N&V, GI bleeding.   Pt felt bloating.   Able to pass gas and had small BM Saturday and today.     Pt said she was afraid to eat because pain gets worse after eatings.     Pt was advised that do not come to GI clinic now. No appointment is available and it has to be discussed with her GI provider for further advice.     Pt was advised that she has to go to ED for evaluation if her abdominal pain is severe.     Message will be forwarded to GI provider for further advice.     Pt verbalized understanding and appreciation.

## 2021-12-13 NOTE — Telephone Encounter (Signed)
Gastroenterology Clinic Telephone Contact Note    Coming out in soft pellets. Had some abdominal pain- whole stomach hurting very bad. Sometimes sits on toilet but nothing comes out. Today pain started again, little poop no bigger than baby finger. Has to move around to pass gas. No throwing up. Does have some heartburn, is taking ppi (advised to switch to morning instead of evening). Hasn't taken miralax bc it gives diarrhea that causes soreness at the anus. Similar to pain she used to have. Weight has increased recently.     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 Avoiding budesonide because of the possibility of it having contributed to adrenal insufficiency. Will prescribe 3 day course of flagyl and call again in ~2 weeks to check on her.       Percell Boston MD  Assistant Professor  Gastroenterology

## 2021-12-26 NOTE — Telephone Encounter (Signed)
Gastroenterology Clinic Telephone Contact Note    Did flagyl for 3 days  Just doing fair  Now every rime eats, stomach hurts and small poop.   Saw uro gyn who gave oxybutynin. Still takin hydrocortisone.   Weight is miantaining (~168)  Cut back from 3 pills qam to 2 pills qam, one qpm  Still taking mesalamine. Calcium, heartburn pill  Current meds: stopped probiotic bc too expensive  Can try stopping mesalamine, try peppermint    Plan: try peppermint oil for anti spasm effect. Take before meals    Eustace Pen MD  Assistant Professor  Gastroenterology

## 2021-12-29 NOTE — Telephone Encounter (Signed)
Pt called and let us know she couldn't take Flagyl any more. Pt asked to just passed on this message to GI provider to call her any time.

## 2021-12-30 MED ORDER — MICONAZOLE NITRATE 2 % TOPICAL CREAM
2 | Freq: Two times a day (BID) | TOPICAL | 1 refills | Status: DC
Start: 2021-12-30 — End: 2022-09-19

## 2021-12-30 NOTE — Telephone Encounter (Signed)
Gastroenterology Clinic Telephone Contact Note    Took flagyl, reports having fish like vaginal odor.  Has been instructed to avoid flagyl in the future. She has had a similar complaint 10/26/21 visit to women's clinic at [facility] where she was prescribed miconazole vaginal cream that worked very well but tells me she is not able to get in to see them soon. I have put in a refill of the Micotin but also told her to make an appointment right away with the women's clinic for more thorough evaluation since I am not able to fully evaluate the symptom.      Eustace Pen MD  Assistant Professor  Gastroenterology

## 2022-02-03 MED ORDER — MESALAMINE 1.2 GRAM TABLET,DELAYED RELEASE
1.2 gram | ORAL_TABLET | ORAL | 1 refills | Status: DC
Start: 2022-02-03 — End: 2022-04-06

## 2022-02-03 NOTE — Telephone Encounter (Signed)
Reviewed chart.   Last visit:  02/28/2019  Last EGD/colonoscopy: 11/23/2020  Last lab test: 04/20/2021  GFR 103     Related visit or procedure in last 12  Months     GFR result > 60 in last 12 months   No positive pregnancy test in last 12Refill criteria:        Visit or procedure  is needed for refill.     It doesn't meet nursing refill protocol.

## 2022-02-07 NOTE — Telephone Encounter (Signed)
Returned pt's call regarding hydrocortisone.     Per pt, she was recommended to have RSV shot and was instructed by her pharmacist that she should hold hydrocortisone for a few days when she has RSV shot. Pt just had RSV shot today and would like to know how long she has to hold her hydrocortisone.     Pt was instructed to contact her hydrocortisone prescriber   [facility] Ordering/Authorizing: Felicity Pellegrini, NP   for further advice.

## 2022-03-06 NOTE — Telephone Encounter (Signed)
Reviewed chart.   Last visit:  03/30/2019  Last EGD/colonoscopy: 03/2021    Visit or procedure is needed for refill.     It doesn't meet nursing refill protocol.

## 2022-03-09 MED ORDER — ESOMEPRAZOLE MAGNESIUM 40 MG CAPSULE,DELAYED RELEASE
40 mg | ORAL_CAPSULE | Freq: Every morning | ORAL | 3 refills | Status: DC
Start: 2022-03-09 — End: 2023-01-02

## 2022-04-06 MED ORDER — MESALAMINE 1.2 GRAM TABLET,DELAYED RELEASE
1.2 | ORAL_TABLET | ORAL | 1 refills | 60.00000 days | Status: DC
Start: 2022-04-06 — End: 2022-06-19

## 2022-06-14 NOTE — Telephone Encounter (Signed)
Returned pt's call but no answer to call. Message left for pt to call back to discuss her questions.

## 2022-06-14 NOTE — Telephone Encounter (Signed)
Pt was last seen by GI 02/28/2019  Last EGD 11/23/2020  Last Colo 09/20/2020    Pt called back to request an appointment to see GI provider. Pt stated she has abdominal pain around stomach area 6-8 out of 10 after eating and before and after BM. Her BM is becoming smaller in size 3-4 times daily. She was afraid she may have bowel obstruction.     Pt denied blood in stool.     Pt is able to pass gas.   Pt denied N&V.     Pt was prescribed hydrocortisone due to secondary adrenal insufficiency. Pt would like to have blood work done for evaluation.     Pt stated she is no longer following up with endocrinology and would like to make an appointment to see GI provider.     Message would be forwarded to GI provider for further advice.     Pt verbalized understanding and appreciation.

## 2022-06-15 NOTE — Telephone Encounter (Signed)
Reviewed chart.   Last visit:  02/28/2019  Last EGD/colonoscopy: 11/23/2020  Last lab test: 04/20/2021     Refill criteria:     Related visit or procedure in last 12  months   GFR result > 60 in last 12 months      Visit or procedure, or lab  is needed for refill.     It doesn't meet nursing refill protocol.

## 2022-06-19 MED ORDER — MESALAMINE 1.2 GRAM TABLET,DELAYED RELEASE
1.2 gram | ORAL_TABLET | ORAL | 1 refills | Status: DC
Start: 2022-06-19 — End: 2022-07-13

## 2022-07-05 ENCOUNTER — Ambulatory Visit
Admit: 2022-07-05 | Discharge: 2022-07-05 | Payer: Medicaid Other | Attending: Student in an Organized Health Care Education/Training Program | Primary: Physician

## 2022-07-05 DIAGNOSIS — K5010 Crohn's disease of large intestine without complications: Secondary | ICD-10-CM

## 2022-07-05 MED ORDER — BISMUTH SUBSALICYLATE 262 MG TABLET
262 | ORAL_TABLET | Freq: Three times a day (TID) | ORAL | 0 refills | Status: DC | PRN
Start: 2022-07-05 — End: 2023-01-29

## 2022-07-05 NOTE — Progress Notes (Signed)
Chelsea Ball is a 62 y.o. female seen for a follow up visit.    History of Present Illness  This is a 62 y.o. female with history of irritable bowel syndrome with constipation, diverticulitis, cholecystectomy (2019), depression, substance use and hysterectomy who is referred to gastroenterology for diverticulosis with abdominal pain and incomplete colonoscopy, following up after colonoscopy was able to be completed.     Background history:  Referred from Dr. Casimiro Needle in [place]. Consult question: "Patient with diverticulosis / diverticulitis (treated).[name] disorted lumen in splenic flexure and descending colon- Please evaluate for possible surgery?"    The patient describes her problem as having problems having a bowel movement. When she eats she has to eat some fruit to have a bowel movement and it can be runny. The patient reports weight loss because she gets severe stomach pain when she eats. The pain is happening on a daily basis. The entire stomach is painful, not one particular area- she feels she has to pant (breathe heavily) which can calm the pain a little bit. She reports that she has been put on bed rest by Dr. Terance Hart because of this. She has had weight loss related to eating less, she was 142 pounds, now down to 115 pounds. The only thing she can eat is soft foods, and reports she's not able to eat much. If she eats more she gets severe pain. She has some nausea but no vomiting. She reports being very weak now. She is also having rotator cuff problem. She reports having bowel movements that are runny - they used to be small, solid, curved, but now have transitioned to being runny. Some days she does not have a bowel movement, and some days she will have a few runny bowel movements. She has some fear of eating. There is no blood in the stools.     She has not seen a nutritionist, does not take any nutrition shakes. She has stopped taking any of her medications because of the abdominal pain, uses  Norco maybe once a week for rotator cuff, occasionally takes Calcium.    The patient reads me her medication list, although she is not curently taking these: Mirtazapine 15, megestrol 15, 25mg  hydroxyzine, 6mg  tizanadine PRN, calcium +vitamin D, docusate, fluoxetine, lamotrigine, albuterol, QVAR, Norco 5. She takes esomeprazole 40mg  as needed for heartburn. She has tried FiberChoice tablets, didn't help her. Prior medicines from Dr. Terance Hart gave her diarrhea. She tried miralax. She takes lactulose sometimes when constipated, helps her go. No enemas. Her prior medication list from June 2020 note of Dr. Terance Hart also mentions several PPI's, as well as metamucil, Creon, Amitiza, Bentyl, despiramine, Donnatal, fleet's enemas, but she reports prior medicines prescribed by Dr. Terance Hart just gave her diarrhea so she's not on them now.     She was seen in June 2020 by her gastroenterologist Dr. Terance Hart who noted that he had attempted colonoscopy but was unable to advance past an area.     Her cholecystectomy did not help her pain in 2019.     Note, utox has been positive for cocaine metabolites on 05/29/18, 12/25/16, ad 01/25/16.     02/28/19 phone visit:  At my initial clinic visit on 01/21/19, I planned to repeat colonoscopy with anesthesia. 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 I started the patient on mesalamine 2.4g daily which she did  not tolerate after it causing sour stomach for a week.     At first she was having small bowel movements with postprandial abdominal pain. She is still having thin stools and postprandial discomfort that begins even right when she begins to eat a meal that has caused some fear of eating larger meals. At our 02/28/19 visit she is a little better but not much. Now she reports walking more after being off bed rest and is having still  thin bowel movements. She reports still not eating very well but thinks she has not lost further weight. She has not filled other medications with her PCP at this point. She is back on her psych meds. She takes opioids but only rarely, not every day.     05/21/19 visit:  At our 04/29/18 video visit, she was given a course of rifaximin with some improvement initially but symptoms returned. She was prescribed a course of Uceris but was not able to get this due to insurance for a couple of months.     At our 05/21/19 visit, she reports symptoms are getting worse. She has frequent small volume diarrhea, sometimes with urges to have a bowel movement but nothing comes out. When she does have bowel movments, it tends to be small bits or runny. She has had some episodes of incontinence. Some days she goes to the bathroom every 20 or 30 minutes and bowel movements are still very thin- bowel movements are like very long worms if it's not watery. Her abdominal pain is severe- reports it is worse than labor pains, intermittent spasms affecting the whole abdomen - she has to lay still and focus on her breathing to get the pain improve. It involves the whole abdomen and tends to happen when she is on the toilet trying to have a bowel movement. She continues to be afraid to eat because when she eats she usually has to go to the bathroom within 10-15 minutes for a bowel movement and then she gest the pain while on the toilet. She changes her body position (leaning backwards) sometimes helps a little stool come out. She continues to lose weight, she thinks now down to maybe 100 pounds although does not have a scale to confirm this.     Interval Events- 08/29/19 visit:   At our 05/21/19 visit I had referred the patient to colorectal surgery given the ongoing weight loss and severity of her symptoms. She then had the Uceris approved and started it June 20, 2019 and did experience some improvement in symptoms so surgery was not pursued at  the time. She reported constipation and was prescribed Miralax in April 2021.     She is continuing to use Miralax with sometimes loose stools. Appetite is increased on the steroids. She feels a lot better on the Uceris, not miserable and waking up overnight anymore. She still has some episodes of wanting to poop and not being able to poop. She's eating moderately. She has 3 more weeks of Uceris left right now. Her weight has gone up, 113# up to 130.     Interval Events: 01/26/20  She had an ED visit 11/05/19 for abdominal pain. Mobic was refilled and gabapentin prescribed.     Lower abd pain, sometimes wrapping around to the back. Pelvic ultrasound was normal. Has been having problems with bowel movements. Has not received budesonide this week. Entire lower abdomen. miralax works only fair. Budesonide was increasing appetite, but eating makes pain worse. Throughout day there  is a periodic ache. bm's 1-2 times per day, but has to lean back and forth to have bm's, normally 3-4 small piees in the toilet. Symptoms tend to be relieved with bowel movements. Weight is around 142.     Interval events: 01/29/20:  01/29/20 plan was to take Uceris every other day for 3 months to see if it can be weaned, prescribed ongoing miralax and a bowel preparation in case needed. Referred for anorectal manometry/biofeedback but insurance reportedly does not cover biofeedback. Taking miralax every other day because daily use gives diarrhea. PCP tapering pain medications. Entocort 3mg  tabs prescribed    She's having problems with the budesonide. She is back to feeling how she was before starting steroids. Bowel habits have been loose and runny, back and forth to the bathroom all day, with stomach pains. Appetite has been low from stomach hurting, very bad, weight is now going down again. This happened when budesonide changed from 9 to 3. Currently not taking miralax. No vomiting. Weight is now 134.     Interval Events 05/31/20:   Pain comes  and goes, pants/breathes wih it. Happening 4-5 times per day. Pain is in the lower abdomen. Appetite increased a bit, weight coming back. Weight 141 now. Taking probiotic. Stools are smaller with some mucus. Stools are soft solids. Budesonide helps with the appetite and keeping weight up but not with the abdominal pains.     Interval events 07/01/20  Thinks mesalamine may be working. Weight is steady, 140. However still having lots of mucus frequently during the day and with bowel movements. Feels like it helps mucus come out. Bowel movements now formed and length of her finger or slightly longer. Takes 2 budesonides 3mg  with mesalamine. She has questions about if ERCP is needed. Feels a problem with swallowing in the neck and throat, not in the chest.     07/19/20 phone call w blood in stool  08/16/20 course of metronidazole for suspected SIBO  09/2020 colonoscopy: advised stop budesonide, +polyps, next colonoscopy recommended in 3 years.   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; 3 day course of flagyl; still on mesalamine, stopped probiotic bc of expense, advised try peppermint oil  12/29/21 patienet called notifying that can't take flagyl anymore  06/14/22 call in with abdominal pain    Past Medical History:   Diagnosis Date    Abdominal pain 01/21/2019    Bipolar 1 disorder (CMS code) 01/21/2019    Depression 01/21/2019    GERD (gastroesophageal reflux disease) 05/04/2016      Past Surgical History:   Procedure Laterality Date    CHOLECYSTECTOMY  04/26/2017    HYSTERECTOMY      right open carpal tunnel release (Right Wrist)  05/15/2018      Social History      Tobacco Use    Smoking status: Every Day     Packs/day: 1.00     Years: 10.00     Additional pack years: 0.00     Total pack years: 10.00     Types: Cigarettes    Smokeless tobacco: Never    Tobacco comments:     3 cigarettes/day   Substance  and Sexual Activity    Alcohol use: Yes     Comment: holidays only    Drug use: Not Currently     Comment: cocaine 7 years ago     Family history:  Father and grandmother with colon cancer, also uncles with colon cancer. Father was in his 75's when he got colon cancer.     Medications:   See HPI    Allergies/Contraindications  No Known Allergies     Social:  3 cigarettes per day  Very rare alcohol  Reports not having used drugs for years      Review of Systems  Gen: No fevers/chills, reports no weight loss since last visit  Cardiovascular: No chest pain, no syncope  All other systems were reviewed and are negative.     Physical Exam:  No physical exam due to telephone visit    I have personally reviewed the labs, imaging and other diagnostics below:     [facility]  09/23/21 acth <5, cortisol <0.5    Latest [facility]  01/04/18 Hgb 13.8, Plt 277, WBC 3.4  Calcium 10.1  Lipase 25  HIV negative  TSH 1.33    05/29/18   Utox positive for cocaine metabolites  Hgb 12.0, Plt 229  TSH 3.34    Latest Imaging and Other Diagnostics  01/28/19 colonoscopy ([place])  IMPRESSIONS:     1.  Two sessile polyps were found in the ascending   colon and at the cecum; polypectomy was performed with cold forceps   2.  4 mm sessile polyp was found in the sigmoid colon; polypectomy was   performed using a cold snare   3.  Two sessile polyps were found in the rectum; polypectomy was   performed with a cold snare   4.  There was severe diverticulosis in the sigmoid colon, ranging from   18cm to 30cm from the anal verge (measured on withdrawal with the   colon straight and short).  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  This was biopsied for   histology   5.  Retroflexed views revealed no abnormalities     FINAL PATHOLOGIC DIAGNOSIS  A. Right colon, polyps, biopsy: Tubular adenoma.       B. Sigmoid polyp, biopsy: Sessile serrated adenoma.  C. Sigmoid, biopsy: Mucosal prolapse.   D. Rectal polyps, biopsy: Sessile serrated adenoma, fragments.       07/23/18 Colonoscopy:  Indication: Abdominal pain (Fentanyl 150 total, Versed 4 total)  Comments: Unable to pass distal transverse colon, diverticulosis noted.     07/05/18 Abdominal ultrasound:   Impression: Fatty infiltration of liver, but without evidence of hepatomegaly, splenomegaly, portal hypertension, focal solid mass lesion, biliary ductal dilatation, ascites nor varices.   S/p cholecystectomy without biliary dilatation or choledocholithiasis  Simple right renal cyst    EGD 08/05/18  Impression: Acute gastritis without bleeding  Pathology:  Antrum biopsy: Mild reactive gastropathy. No intestinal metplasia, H pylori, or neoplasia.    CT abdomen/pelvis with contrast 01/06/19:   Impression:   S/p cholecystectomy clips without evidence of biliary ductal dilatation or choledocholithiasis seen  S/p hysterectomy findings  Fatty infiltration of liver  Bilateral simple renal cysts, largest of which measures 11 x 16 x 67mm in mid lower pole junction region of right kidney    01/04/18 CT abdomen/pelvis with IV contrast: (abdominal pain):  FINDINGS:  Mediastinum: A small hiatal hernia is present.    Liver: A small amount of focal fatty infiltration is seen in the liver adjacent  to the falciform ligament. No suspicious masses are seen within the liver.  Gallbladder and bile ducts: The gallbladder has been removed.  Pancreas: Normal. No ductal dilation.  Spleen: Normal. No splenomegaly.  Adrenals: Normal. No mass.  Kidneys and ureters: The right kidney demonstrates a simple cyst in the lower  pole measuring 1.6 cm in diameter. The left kidney demonstrates a  simple cyst  in the lower pole measuring 6 mm in diameter. There is no hydronephrosis. No  renal stones are identified.  Stomach and bowel: Unremarkable. No obstruction. No mucosal thickening.  Appendix: No evidence of appendicitis.  Intraperitoneal space: Unremarkable. No free air. No significant fluid  collection.  Vasculature: Unremarkable. No abdominal aortic aneurysm.  Lymph nodes: Unremarkable. No enlarged lymph nodes.    Bladder: Unremarkable as visualized.  Reproductive: Unremarkable as visualized.  Bones/joints: Unremarkable. No acute fracture.  Soft tissues: Unremarkable.  IMPRESSION:  Small hiatal hernia.  Bilateral renal cysts.  No acute findings.  Colonic diverticulosis.    04/13/17 CT abd/pelvis with contrast:   IMPRESSION:  1. Small benign-appearing bilateral renal cysts.  2. Rectosigmoid diverticulosis.  3. 3 mm pulmonary nodule anterior right lung base. Recommend  followup as per Fleischner Society criteria.    01/02/14 CT abd/pelvis with IV contrast:   1.  Colonic diverticulosis.  Apparent mild circumferential  wall thickening of    the mid sigmoid colon, which is favored to be artifactual, related to    nondistention.  However, cannot fully exclude early acute sigmoid    diverticulitis or focal colitis.  Recommend correlation with the patient's     clinical symptoms.   2.  Echogenic foci with within the dependent gallbladder likely represent    gallstones.  However, recommend followup with gallbladder ultrasound to  fully    exclude the less likely possibility of a polypoid gallbladder lesion.   3.  Tiny distal esophageal diverticulum.   4.  Noncalcified 3 mm right middle lobe pulmonary nodule.  Recommend  comparison    with any prior CT examinations that are available. If none are available,    recommend followup according to Fleischner criteria.    Colonoscopy 10/21/14: report not available    EGD 12/23/2014: report not available      Assessment and Plan  Problem List:  # Abdominal pain  #  Diverticulosis    This is a 62 y.o. female with history of irritable bowel syndrome with constipation, diverticulitis, cholecystectomy (2019), depression, substance use and hysterectomy who is referred to gastroenterology for diverticulosis with abdominal pain.     Her outside gastroenterologist was not able to complete a colonoscopy in 2020 due to the severity of her diverticulosis. Subsequent colonoscopy with anesthesia here at [facility] 01/28/19 was able to be completed but with significant difficulty showing quite severe diverticulosis with endoscopic appearance suggestive of SCAD (however biopsies just showed mucosal prolapse, not inflammation).     Overall I do suspect that her lower abdominal symptoms are due to extreme sigmoid diverticulosis with intermittent partially obstructive symptoms. Uceris helped her abdominal pain, however later diagnosed with adrenal insufficiency so has been off the Uceris (unclear if QVAR also mor elong term could have been more underlying cause). Went on hydrocortisone which led her to feeling a lot better. However at our 07/05/22 visit, reports abdominal pain and bowel habits are really bad again, limiting  ability to live her life (small frequent bowel habits, difficult to control, difficult having bowel movements, cramping diffuse abdominal pain.     It seems that mesalamine overall is not keeping symptoms in remission so will stop it. Will reach out to endocrine to see if she can go back up on hydrocortisone (symptoms may have worsened after decreasing hydrocotisnoe dose from 15mg  qam 5mg  qpm down to 10mg  qam, 5mg  qpm). Will also repeat colonoscopy to assess severity of likely SCAD and assess for polyps. Discussed again that surgery for sigmoid colon can be a last resort but would not guarantee symptom resolution, if there isa degree of irritable bowel syndrome overlying the severe diverticulosis.     Summary of Recommendations:   -Will stop lialda  -message to endocrine re:  hydrocortisone dose  -Colonoscopy    I, Guido Sander, MD, spent a total of 54 minutes on this patient's care on the day of their visit excluding time spent related to any billed procedures. This time includes time spent with the patient as well as time spent documenting in the medical record, reviewing patient's records and tests, obtaining history, placing orders, communicating with other healthcare professionals, counseling the patient, family, or caregiver, and/or care coordination for the diagnoses above.

## 2022-07-05 NOTE — Patient Instructions (Addendum)
Dear Chelsea Ball,    It was nice to see you in clinic today! I recommend:     -I will send a message to the endocrine doctors about the hydrocortisone dose and if we need blood test    -Please schedule colonoscopy with anesthesia    -Stop taking the mesalamine for now and we will see how you do.     -Try Bismuth (pepto bismol) one pill up to 3 times per day ( do not take long term, more than a week at a time), to help with stool smell and diarrhea. You can ask at [employer] what is a generic of Pepto Bismol they can give you    Sincerely,  Dr. Chalmers Cater

## 2022-07-12 NOTE — Telephone Encounter (Addendum)
I called pt back to provide her the information per Dr. Miguel Rota and Fiona's advice below. Pt was very upset and said she wants to go back to Dr. [name] who referred her to [place] and she wants Dr. [name] to re-refer her back to Dr. [name].      Thank you both!      Fiona McTiernan  You; Ivory Broad, MD13 minutes ago (3:00 PM)     FM  Correct she refused to schedule the soonest date in July when I called, and Alla called to offer 6/21 which she refused again. Eric submitted the auth request to her insurance so once that is approved I can offer her late cancellations but at the moment I just have her on my wait list.    -Rita Ohara, MD  You; Jana Half McTiernan23 minutes ago (2:51 PM)       We would like to get her in for colo with mac as soon as I have availability. The availability I had was too far away per the patient which is why I believe it has not been scheduled yet         Patient/Caregiver calling for the following reason: pt asking for clinician to contact her ASAP because of current symptoms and to try to get a referral for sooner colonoscopy     Preferred mode of communication is Phone number on file.  Preferred phone number:  [phone]    Molli Knock to leave message Yes.    Venetia Constable  [facility]

## 2022-07-13 MED ORDER — MESALAMINE 1.2 GRAM TABLET,DELAYED RELEASE
1.2 | ORAL_TABLET | ORAL | 1 refills | 60.00000 days | Status: DC
Start: 2022-07-13 — End: 2022-10-20

## 2022-07-13 NOTE — Telephone Encounter (Signed)
Pt called me stating that she does not think PeptoBismol is working for her. She wants to stop it and go back to mesalamine.  2.   Also, she insists on having a referral back to see Dr. [name] for colonscopy only as we are unable to accommodate her sooner than June. When I asked her if her PCP can refer her, she said that PCP requires a visit for any referrals and she won't see PCP until May.

## 2022-07-22 IMAGING — US US SOFT TISSUE HEAD/NECK
1 series · 14 of 18 positions shown · non-contrast
Comparison: No pertinent prior exams available for comparison.

CLINICAL DATA: Posterior cervical lymphadenopathy.

EXAM:
ULTRASOUND OF HEAD/NECK SOFT TISSUES
TECHNIQUE: Ultrasound examination of the head and neck soft tissues was
performed in the area of clinical concern.

[Series 1: us soft tissue head/neck · 0.07mm/px · 14 of 18 slices shown]
[im 1/18]
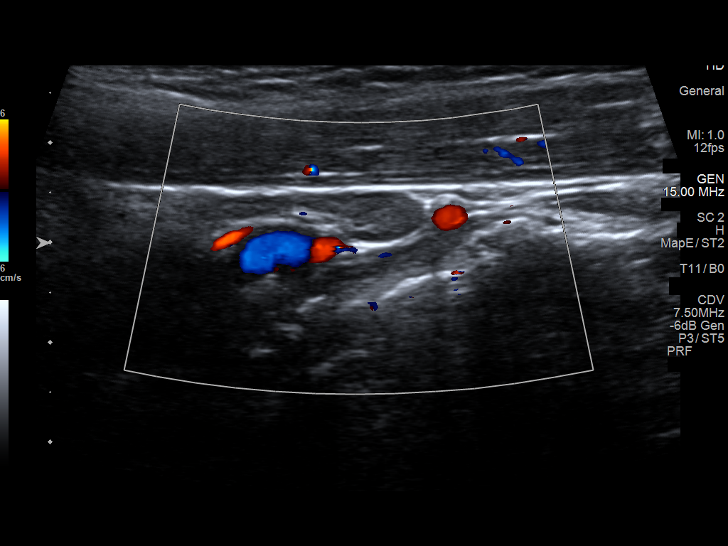
[im 2/18]
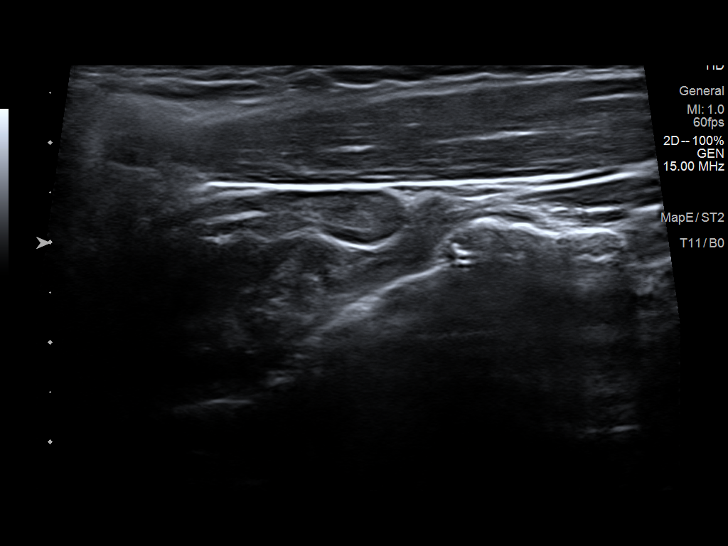
[im 4/18]
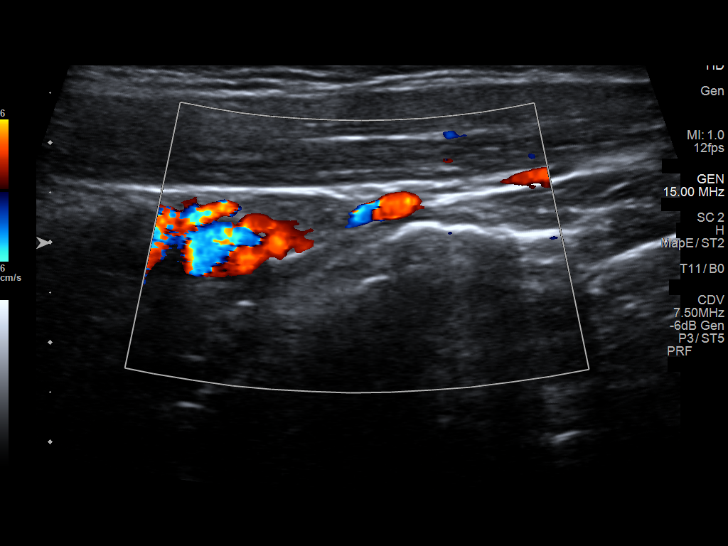
[im 5/18]
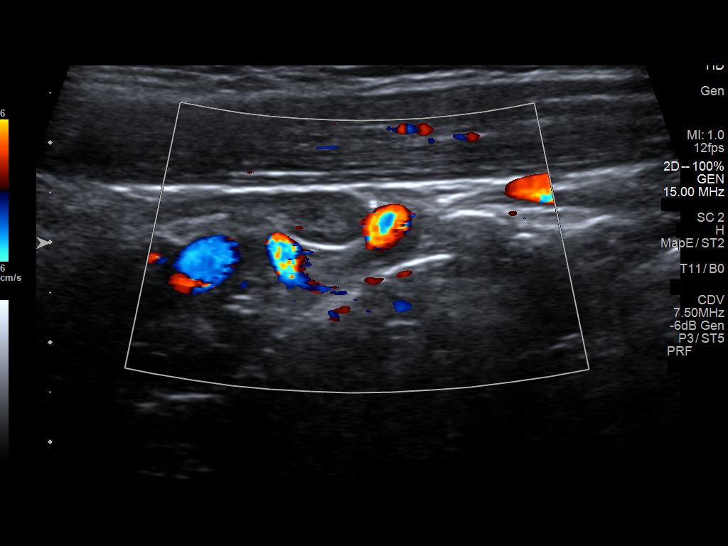
[im 6/18]
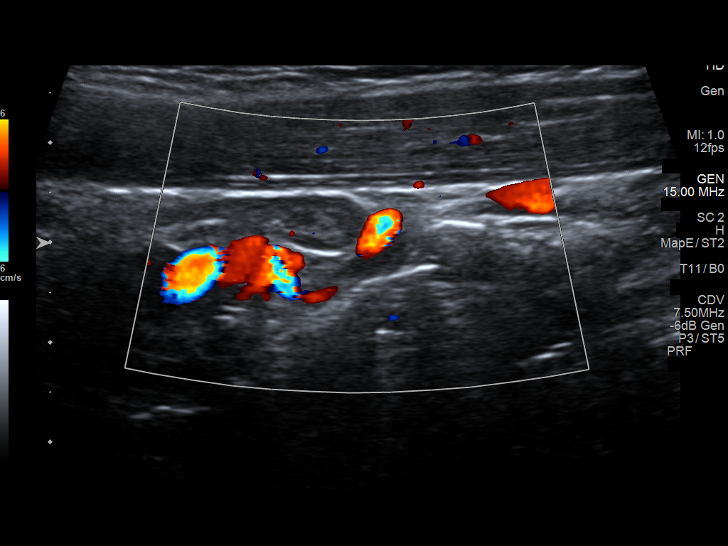
[im 8/18]
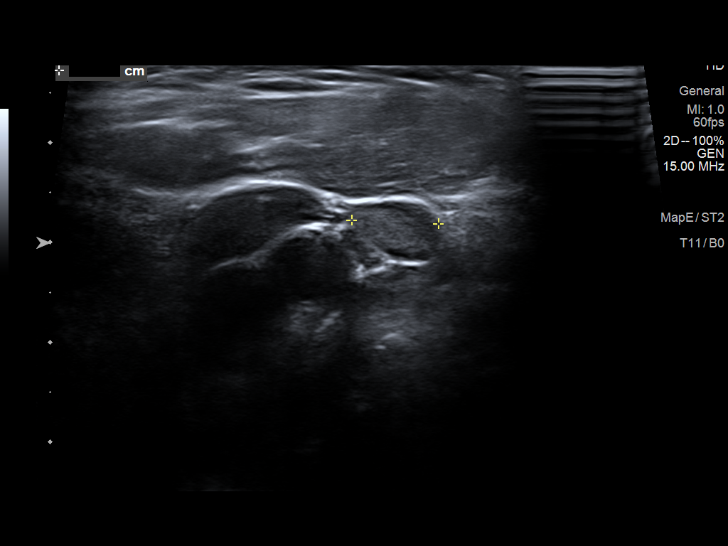
[im 9/18]
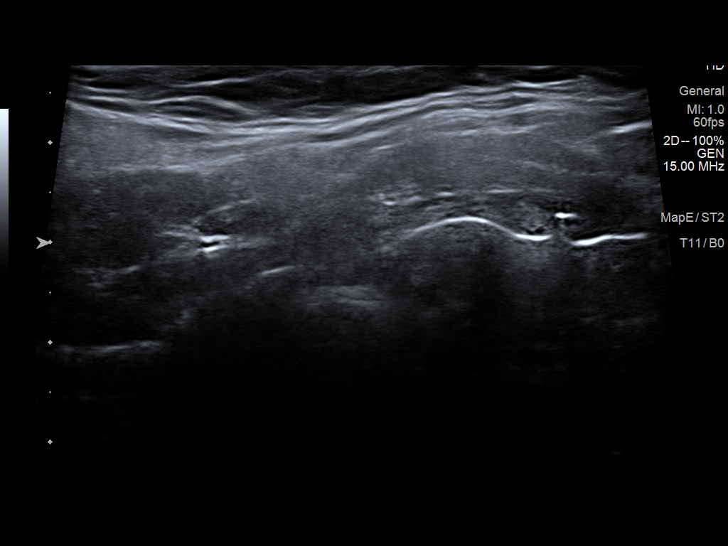
[im 10/18]
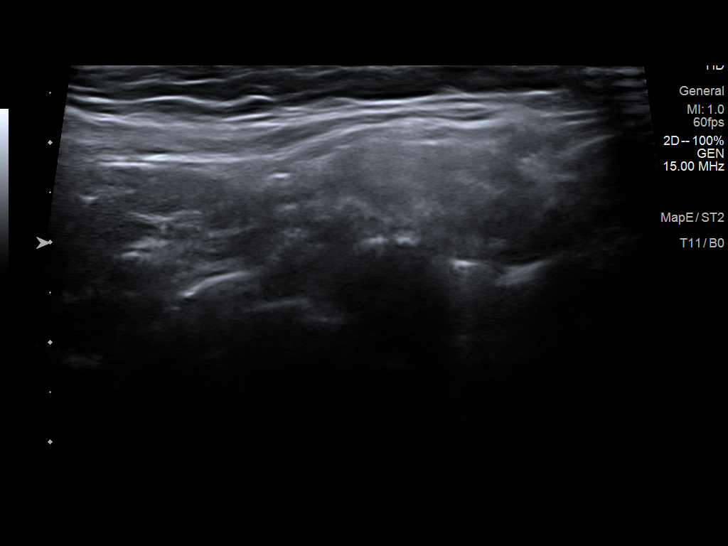
[im 11/18]
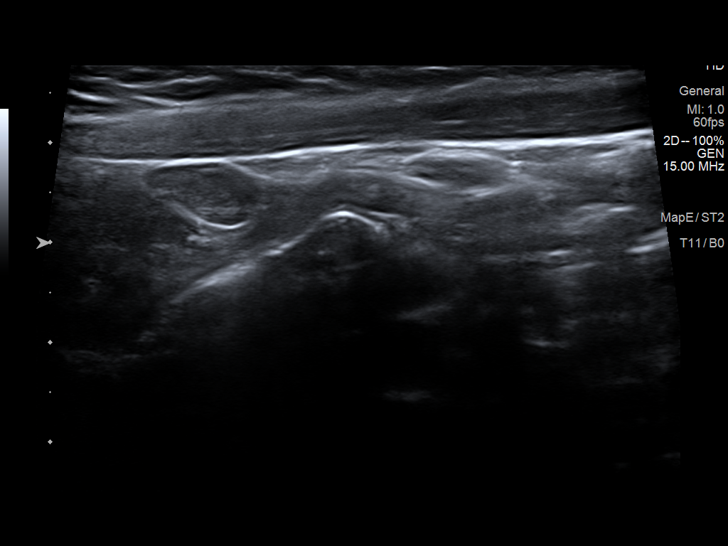
[im 13/18]
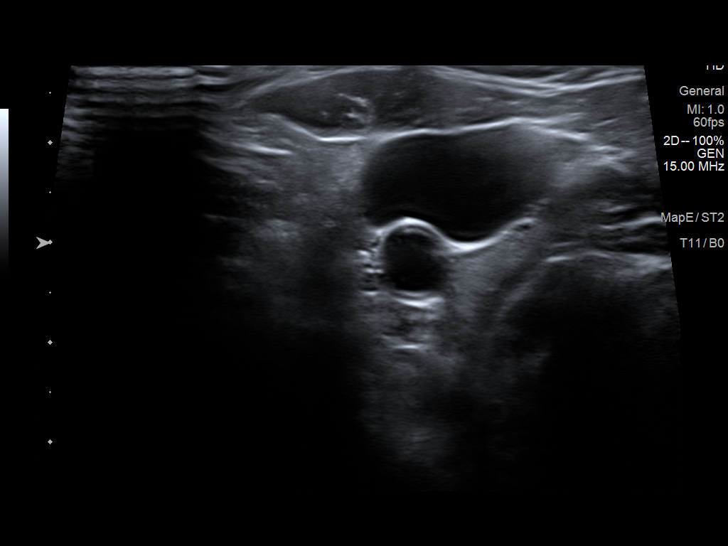
[im 14/18]
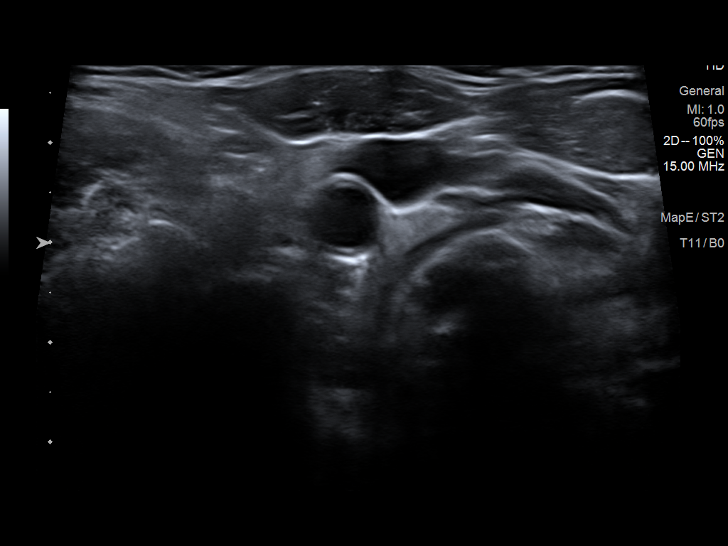
[im 15/18]
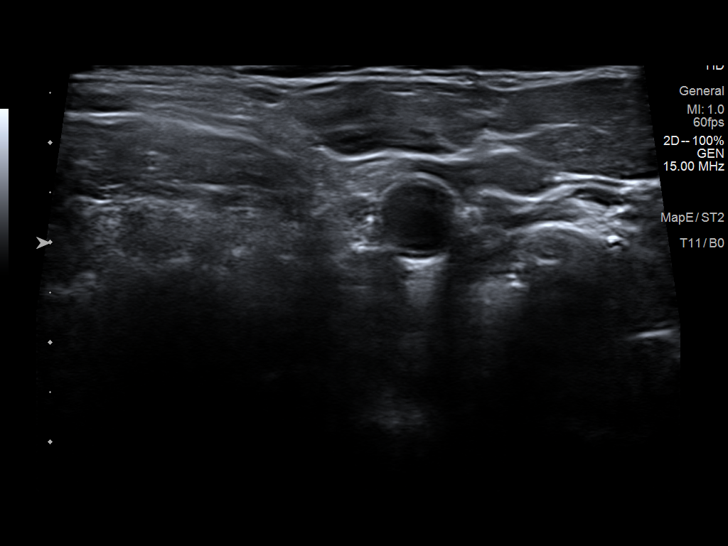
[im 17/18]
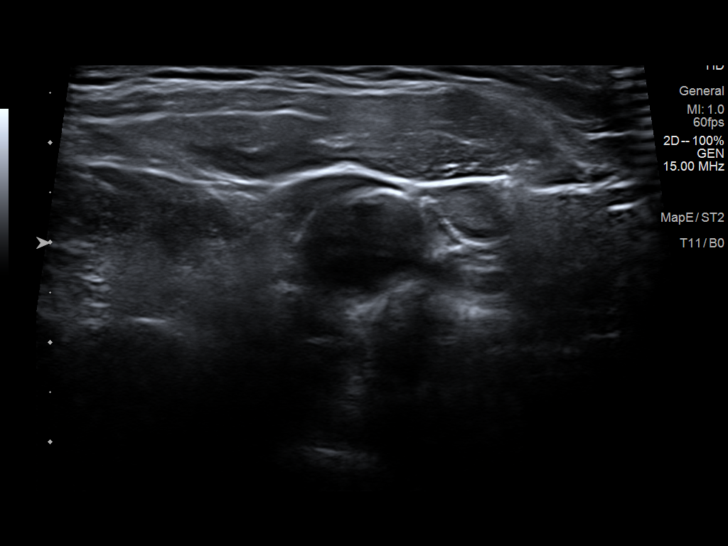
[im 18/18]
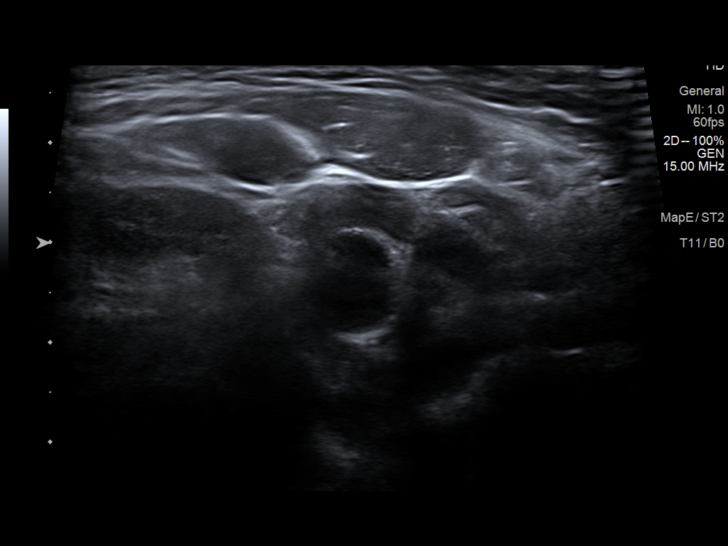

[14 of 18 positions shown; findings below may reference images not displayed]

FINDINGS: Targeted ultrasound was performed in the right mid-neck region of
concern. At this site, there is an ovoid lymph node measuring 1.9 x
0.5 x 0.9 cm. This is not enlarged by short axis measurement
criteria. No soft tissue mass or pathologically enlarged lymph node
is identified at this site.
IMPRESSION: Targeted ultrasound performed in the right mid-neck region of
concern. A non-enlarged lymph node is present at this site
(measuring 9 mm in short axis). However, there is no appreciable
pathologically enlarged lymph node or mass at this site. A
contrast-enhanced neck CT may be obtained for further evaluation, as
clinically warranted.

## 2022-07-22 MED ORDER — DOCUSATE SODIUM 100 MG CAPSULE
100 mg | Freq: Every day | ORAL | Status: DC
Start: 2022-07-22 — End: 2022-12-22

## 2022-07-22 NOTE — Patient Instructions (Signed)
We want your upcoming procedure visit to be as safe and comfortable as possible. The following instructions are designed to prepare you for your procedural hospitalization.  Please read and follow all instructions carefully.    Please complete the following tasks:    Complete your eConsent through MyChart -Call your surgeon for more information      Procedure Location  Your procedure is scheduled to take place at [facility] [address], Surgery Reception Room A325 (Take A Elevators to 3rd Floor). Phone [phone].    Procedure Date and Time  Please arrive on 08/01/2022 .       Contact your surgeon's office if you have not received your time of arrival for the day surgery    If for any reason your procedure start time is changed, you will receive a call from your surgeons office.  Should you have any questions regarding the date or time of arrival, please call your surgeons office and ask to speak with his/her practice assistant.    Preparing for Your Procedure  What can I eat or drink on the day of the procedure?  Please do not have anything to eat or drink except clear liquids after midnight the evening before your procedure (including gum, candy or mints).  You may have clear liquids on the day of your procedure up to 2 hours prior to arrival.  Clear liquids include:  Non-pulp, clear apple juice.  Tea with sugar or sweetener (NO milk, cream, or milk substitute)  Gatorade  Water  If you drink anything other than clear liquids on the day of your procedure, or if you have ANYTHING to drink in the two hours prior to hospital arrival, then your doctors will cancel your procedure for your safety.  If you have been instructed to take any medications the day of your procedure, take them with a small sip of water.    If your surgeon has given you additional instructions about what to eat or drink before the day of your procedure, please follow those instructions as well.    Which medications should I  take on the day of the procedure?     Medication List            Accurate as of July 22, 2022  7:56 AM. Always use your most recent med list.                Medication Information        Take Morning of Surgery Special Instructions Other   albuterol 90 mcg/actuation metered dose inhaler  Inhale into the lungs every 6 (six) hours as needed for Wheezing  Commonly known as: PROVENTIL HFA;VENTOLIN HFA   Take if needed           bismuth subsalicylate 262 mg Tab  Take 1 tablet by mouth 3 (three) times daily as needed (for foul smelling stool)   Do not take           budesonide 3 mg 24 hr capsule  TAKE 2 CAPSULES BY MOUTH EVERY MORNING  Commonly known as: ENTOCORT EC   Take           calcium carbonate-vitamin D 600 mg-10 mcg (400 unit) tablet   Do not take           cholecalciferol (vitamin D3) 1000 UNITS tablet  Take 1 tablet (1,000 Units total) by mouth daily   Not taking  docusate sodium 100 mg capsule  Take 1 capsule (100 mg total) by mouth daily  Commonly known as: COLACE   Do not take           esomeprazole 40 mg capsule  Take 1 capsule (40 mg total) by mouth every morning before breakfast  Commonly known as: NEXIUM   take           estrogens (conjugated) 0.625 mg tablet  Commonly known as: PREMARIN   Not taking           HYDROcodone-acetaminophen 5-325 mg tablet  Take 1 tablet by mouth  Commonly known as: NORCO   Take if needed           hydrocortisone 5 mg tablet  Take 2 tabs by mouth in the morning and 1 tab by mouth in the afternoon between 2pm-3pm  Commonly known as: CORTEF   take           hydrOXYzine 25 mg tablet  Take 1 tablet (25 mg total) by mouth every 6 (six) hours as needed  Commonly known as: ATARAX   Not taking           lamoTRIgine 100 mg tablet  Take by mouth daily  Commonly known as: LaMICtal     Take          loratadine 10 mg tablet  Take 1 tablet (10 mg total) by mouth daily  Commonly known as: CLARITIN     Take if needed         LORazepam 0.5 mg tablet  Take 1 tablet (0.5 mg total) by  mouth every 30 (thirty) minutes as needed for Anxiety (prior to imaging study or procedure) for up to 2 doses  Commonly known as: ATIVAN     Take if needed         mesalamine 1.2 gram EC tablet  TAKE 1 TABLET BY MOUTH EVERY DAY  Commonly known as: LIALDA   take           miconazole 2 % cream  Apply topically Twice a day to affected area on vagina.  Commonly known as: MICOTIN              ondansetron 8 mg tablet  Take 8 mg po every 6- 8 hours up to two doses, as needed for nausea related to colonoscopy prep  Commonly known as: ZOFRAN              oxybutynin 10 mg 24 hr tablet  Take 1 tablet (10 mg total) by mouth daily  Commonly known as: DITROPAN XL     take         polyethylene glycol 17 gram/dose powder  DISSOLVE 17 GRAMS INTO WATER AND DRINK BY MOUTH EVERY DAY  Commonly known as: MIRALAX              pregabalin 75 mg capsule  Take 1 capsule (75 mg total) by mouth daily as needed  Commonly known as: LYRICA              PROBIOTIC ORAL  Take by mouth daily              QVAR REDIHALER 80 mcg/actuation breath activated inhaler  Generic drug: beclomethasone              simethicone 125 mg chewable tablet  PLEASE PURCHASE OVER THE COUNTER. PLEASE TAKE 3 TABLETS (approx. 400 mg) THE NIGHT BEFORE THE PROCEDURE and 3 TABLETS (approx.  400 mg) THE MORNING OF WHILE DRINKING THE BOWEL PREP.  Commonly known as: Cytogeneticist to Come to the [facility]  Showering Instructions:  Shower the morning of surgery. After showering, do not apply lotion, cream, powder, deodorant, or hair conditioner.   Do not shave or remove body hair. Shaving your face is generally fine. If you are having head surgery, however, ask your doctor whether you can shave.      Wear casual, loose fitting, comfortable clothing and leave all valuables, including jewelry, and large sums of cash at home.  Leave your valuables at home. Belongings that remain with you are your responsibility. [facility] is not liable for loss or damage. If valuables  are brought to the hospital, they will be identified and recorded by our admitting team. All jewelry must be removed and left at home. If rings cannot be removed, we encourage you to have them removed by a jeweler prior to your procedure to avoid damage or loss of ring.  [facility] is not responsible of lose or damaged jewelry. This policy protects the patient and prevents the items from being lost or damaged.   Leave contact lenses at home. Wear your eyeglasses and bring a case.  If you develop any illness prior to the procedure (fever, cough, sore throat, cold, flu, infection), OR START A NEW MEDICATION, please call your surgeon and the [facility] at [phone].  If you are spending the night you may bring toiletries and sleeping clothes if you desire; otherwise the hospital will provide them for you.   DO NOT bring your medications with you to the hospital unless you were specifically instructed to do so.  DO bring a list of your medications including dose(s) and when you take them.  DO bring TWO forms of ID - including one ID with a photo.    Leaving the Hospital  Please ask your surgeon about your anticipated length of stay.  Please make sure your ride is available to pick you up by 12N on day of discharge  It is recommended that all patients have a responsible person at home the first night after discharge from the hospital.  ALL patients, including same day procedure patients, must arrange for an adult to drive/escort them home upon discharge.  Patients going home the same day of their procedure will have their procedure cancelled if these arrangements are not made ahead of time.    Family and Friends  Friends and family may wait in the assigned waiting areas.  Patient care coordinators are available in these patient waiting areas to provide updates regarding patient progress; hospital room assignments; and discharge planning (for same day procedures).    [facility], [address] 1st Floor, Room [address]    Avoid Advil, Motrin, Ibuprofen, Aleve, Naproxen, Excedrin, meloxicam (Mobic), diclofenac (Voltaren),  3-5 days before your surgery. (Ask your provider)    Avoid Aspirin (unless otherwise instructed by your provider), Fish Oil, Omega 3, glucosamine/chondroitin, and Vitamin E, ginkgo biloba, garlic, turmeric/curcumin, herbal medicines, and flax seeds oil 7 days before surgery to prevent prolonged bleeding.       If you are using a CPAP machine at home, please bring it to the hospital on the day of the procedure.      Please notify us if you have any device/pump in your body. If  you are using an insulin pump, please bring tubing and any equipment with your pump in place on the day of procedure. If you have pain pump, please bring the activator control device on the day of the procedure.     If your procedure requires you to shower with Hibiclens soap, you can pick it up for free at the [facility] on [address] on the third floor or in the [facility] at [facility] Room [address] on the first floor. We are open M-F 8-4:30PM. Otherwise, you can purchase it at your local pharmacy without a prescription.    Please do not hesitate to call me with any additional questions or concerns.    Thank you.    Sincerely,    Steward Drone     ____________________________________________________  Gretta Arab, MSN; [facility]  Family Nurse Practitioner  [facility]  [address] 1st Floor, Room [address]  [place], [place] [address]  Office [phone] or [phone] (prepare clinic)  Fax: [phone]    If you want others to be happy, practice compassion. If you want to be happy, practice compassion.    --Dayna Ramus'    Guided Imagery  Research has shown that listening to guided imagery is helpful for many health conditions.  [place] offers these sessions to listen to at the following website:  https://osher.http://huff.org/     Please consider guided imagery to prepare for your procedure and for coping with stress, sleep and pain.      Visitor Information:  For additional visitor information please go to [email or web address]

## 2022-07-22 NOTE — Anesthesia Pre-Procedure Evaluation (Signed)
[facility]  Anesthesia Preprocedure Evaluation    Procedure Information       Case: [id number] Date/Time: 08/01/22 1605    Procedure: ENDO ADULT COLONOSCOPY WITH BIOPSY    Diagnosis: Segmental colitis associated with diverticulosis (CMS code) [K50.10, K57.30]    Pre-op diagnosis: Segmental colitis associated with diverticulosis (CMS code) [K50.10, K57.30]    Location: [facility] / [facility]    Surgeons: Ivory Broad, MD            Precautions            None            Relevant Problems   Nervous   (+) Abdominal pain   (+) Back pain   (+) Bilateral carpal tunnel syndrome   (+) Chronic low back pain with bilateral sciatica   (+) Neck pain   (+) Neuropathy, 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, right   (+) Paresthesia and pain of both upper extremities      Respiratory   (+) Asthma   (+) Moderate persistent asthma      Digestive   (+) Drug-induced constipation   (+) GERD (gastroesophageal reflux disease)   (+) Gastroduodenitis   (+) Vitamin D deficiency      Genitourinary   (+) Stress incontinence in female      Musculoskeletal   (+) Cervical stenosis of spinal canal   (+) Osteoarthritis of shoulder   (+) Polyarthritis      Infectious/Inflammatory   (+) Eczema      Other   (+) Anxiety   (+) Bipolar 1 disorder (CMS code)   (+) Depression   (+) History of colonoscopy   (+) 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 Tel consult 07/22/2022    #Asthma-stable; denies hospitalizations  or steroid tapers  #GERD-nexium  #Bipolar disorder       (Please refer to APeX Allergies, Problems, Past Medical History, Past Surgical History, Social History, and Family History activities, Results for current data from these respective sections of  the chart; these sections of the chart are also summarized in reports, including the Patient Summary Extracts found in Chart Review)        Summary of Prior Anesthetics: Previous anesthetic without AAC  11/23/20 Colonoscopy        Review of Systems Functional Status: Climb a flight of stairs or walk up a hill (5.50 METs)   Constitutional:  Negative for chills and fever.   Airway:  Positive for dental hardware (full dentures/upper and lower). Negative for limited neck movement, difficulty opening mouth, TMJ problem, witnessed apnea and CPAP  Respiratory:  Negative for cough, Recent URI Symptoms and shortness of breath.    Cardiovascular:  Negative for leg swelling, palpitations and chest pain.   Gastrointestinal:  Positive for GERD Symptoms. Negative for abdominal pain.   Genitourinary:  Negative for difficulty urinating and dysuria.   Musculoskeletal:  Negative for neck pain and back pain.   Neurological:  Negative for syncope and seizures.   Hematological:  Does not bruise/bleed easily.   Psychiatric/Behavioral/Developmental: Negative.        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, right    Osteoarthritis of shoulder    Paresthesia and pain of both upper extremities    Polyarthritis    Schizophrenia (CMS code)    Stress incontinence in female    Tobacco user    Vitamin D deficiency    Abdominal pain    History of colonoscopy    Segmental colitis associated with diverticulosis (CMS code)     Current Medications         Dosage    albuterol 90 mcg/actuation metered dose inhaler  Inhale into the lungs every 6 (six) hours as needed for Wheezing    beclomethasone (QVAR REDIHALER) 80 mcg/actuation breath activated inhaler     bismuth subsalicylate 262 mg TAB Take 1 tablet by mouth 3 (three) times daily as needed (for foul smelling stool)    calcium carbonate-vitamin D 600 mg(1,500mg ) -400 unit tablet     docusate sodium (COLACE) 100 mg capsule Take 1 capsule (100 mg total) by mouth daily    esomeprazole (NEXIUM) 40 mg capsule Take 1 capsule (40 mg total) by mouth every morning before breakfast    HYDROcodone-acetaminophen (NORCO) 5-325 mg tablet Take 1 tablet by mouth    hydrocortisone (CORTEF) 5 mg tablet Take 2 tabs by mouth in the morning and 1 tab by mouth in the afternoon between 2pm-3pm    mesalamine (LIALDA) 1.2 gram EC tablet TAKE 1 TABLET BY MOUTH EVERY DAY    oxybutynin (DITROPAN XL) 10 mg 24 hr tablet Take 1 tablet (10 mg total) by mouth daily    pregabalin (LYRICA) 75 mg capsule Take 1 capsule (75 mg total) by mouth daily as needed    budesonide (ENTOCORT EC) 3 mg 24 hr capsule TAKE 2 CAPSULES BY MOUTH EVERY MORNING    cholecalciferol, vitamin D3, 1000 UNITS tablet Take 1 tablet (1,000 Units total) by mouth daily    estrogens, conjugated, (PREMARIN) 0.625 mg tablet     hydrOXYzine (ATARAX) 25 mg tablet Take 1 tablet (25 mg total) by mouth every 6 (six) hours as needed    Lactobacillus acidophilus (PROBIOTIC ORAL) Take by mouth daily    lamoTRIgine (LAMICTAL) 100 mg tablet Take by mouth daily    loratadine (CLARITIN) 10 mg tablet Take 1 tablet (10 mg total) by mouth daily    LORazepam (ATIVAN) 0.5 mg tablet Take 1 tablet (0.5 mg total) by mouth every 30 (thirty) minutes as needed for Anxiety (prior to imaging study or procedure) for up to 2 doses    miconazole (MICOTIN) 2 % cream Apply topically Twice a day to affected area on vagina.    ondansetron (ZOFRAN) 8 mg tablet Take 8 mg po every 6- 8 hours up to two doses, as needed for nausea related to colonoscopy prep    polyethylene  glycol (MIRALAX) 17 gram/dose powder DISSOLVE 17 GRAMS INTO WATER AND DRINK BY MOUTH EVERY DAY    simethicone (MYLICON) 125 mg  chewable tablet PLEASE PURCHASE OVER THE COUNTER. PLEASE TAKE 3 TABLETS (approx. 400 mg) THE NIGHT BEFORE THE PROCEDURE and 3 TABLETS (approx. 400 mg) THE MORNING OF WHILE DRINKING THE BOWEL PREP.          Allergies/Contraindications   Allergen Reactions    Metronidazole Other (See Comments)     Flares up yeast     Past Medical History:   Diagnosis Date    Abdominal pain 01/21/2019    Bipolar 1 disorder (CMS code) 01/21/2019    Depression 01/21/2019    GERD (gastroesophageal reflux disease) 05/04/2016    History of motion sickness     PONV (postoperative nausea and vomiting)      Past Surgical History:   Procedure Laterality Date    CHOLECYSTECTOMY  04/26/2017    HYSTERECTOMY      right open carpal tunnel release (Right Wrist)  05/15/2018     Ht 154.9 cm ( )   Wt 74.4 kg (164 lb)   BMI 30.99 kg/m           Physical Exam       Prepare (Pre-Operative Clinic) Assessment/Plan/Narrative  Prepare Clinic consult type: Telephone  07/22/2022 B Teyonna Plaisted,FNP  AVS emailed [email or web address]          Obstructive Sleep Apnea Screening  STOPBANG Score:      S Do you Snore loudly (louder than talking or loud  enough to be heard through closed doors)? No    T Do you often feel Tired, fatigued, or sleepy during daytime? No    O Has anyone Observed you stop breathing during  your sleep? No    P Do you have or are you being treated for high blood Pressure? No    B BMI more than 35 KG/m^2? No    A Age over 26 years old? Yes    N Neck circumference > 16 inches (40cm)?     G Gender: Female? No    STOPBANG Total Score 1         Risk Level (based only on STOPBANG) Low - Incomplete       CPAP/BiPAP prescribed - No.                Anesthesia Assessment and Plan  ASA 2       Anesthesia Plan  Invasive Monitors/Vascular Access: none    Informed Consent for Anesthesia    Interpreter: N/A - patient/guardian's  preferred language is English    (See Anesthesia Record for attending attestation)    [Please note, smart link data included in this note may not reflect changes since note creation. Please see appropriate section of APeX for up-to-the minute information.]

## 2022-07-25 NOTE — Nursing Note (Signed)
Spoke to pt and confirmed procedure  (Colonoscopy with MAC) date-08/01/22 and arrival time-1500/location-[facility] [address]/NPO 2 hours prior to arrival time/driver required/ bowel prep solution/meds received.    Avoid high fiber foods 3 days prior to procedure    High fiber foods include salads, whole grains, whole beans, seeds like flax and chia, nuts, popcorn and fruits/vegetables with many seeds such as cucumbers, tomatoes, strawberries.      Instructed pt to adhere to clear liquid diet all day 07/31/22     ( AVOID drinks that are colored purple and red. Clear liquids include: water, clear juices (with no pulp), clear broth (beef, vegetable or chicken), coconut water, Gatorade, coffee or tea without milk, green/yellow jello. Honey and sugar are OK.)      Advised pt to start drinking 1st half of prep at 6 pm 07/31/22 and 2nd half of prep 6 hours prior to arrival time 08/01/22. Drink 8 oz of prep solution every 15- 30 minutes as tolerated.   Nothing by mouth 2 hours prior to arrival time this includes gum and candy. Pt verbalized understanding.    Pt is not active on Mychart/ prep instructions were mailed to pt's home address    Encouraged pt to review prep instructions sent to Mychart      **Do not bring any valuables to your procedure**  Pre-op COVID-19 testing is no longer required.    No driving the day of procedure. Ok to resume driving the following day

## 2022-07-31 NOTE — Telephone Encounter (Signed)
Called pt to relay Dr. Miguel Rota advice:     Yes I think if it is improving she can still get the colonoscopy.     Pt is waiting for her care team to advise if she should proceed with her colo tomorrow. Pt was advised to call nurse once she hears back from her care team.     Pt verbalized understanding and appreciation.

## 2022-07-31 NOTE — Telephone Encounter (Signed)
Pt is scheduled colo with MAC tomorrow.   Pt called and stated she had sore throat since 4/19 last Friday. She was awaked by sore throat (pain 9 out of 10) this morning. Her sore throat is intermittent and pain intensity is varies. Her current sore throat is 3-4 out of 10.   Pt stated it is not dry mouth caused sore throat.   She has cough with clear phlegm.    Pt denied fever and chill.   Pt denied shortness of breath.   She is not sure if she would proceed for colo as scheduled.   Pt would like to hear back form GI provider if she could proceed with colo. Pt will keep [place] updated later today  how she feels.

## 2022-08-01 NOTE — H&P (Addendum)
Procedure Planned: Colonoscopy  Diagnosis: Abdominal pain    Chief Complaint: Abdominal pain    Past Medical History:   Diagnosis Date    Abdominal pain 01/21/2019    Bipolar 1 disorder (CMS code) 01/21/2019    Depression 01/21/2019    GERD (gastroesophageal reflux disease) 05/04/2016    History of motion sickness     PONV (postoperative nausea and vomiting)      Past Surgical History:   Procedure Laterality Date    CHOLECYSTECTOMY  04/26/2017    HYSTERECTOMY      right open carpal tunnel release (Right Wrist)  05/15/2018     Allergies/Contraindications   Allergen Reactions    Metronidazole Other (See Comments)     Flares up yeast      No medications prior to admission.       Social History  She   reports that she has been smoking cigarettes. She has a 10 pack-year smoking history. She has never used smokeless tobacco. She reports current alcohol use of about 4.0 - 5.0 standard drinks of alcohol per week. She reports that she does not currently use drugs.   Family History  family history is not on file.   Family history is otherwise negative or as noted above.      Physical Exam:  Vss:  There were no vitals taken for this visit.   Wt Readings from Last 3 Encounters:   07/22/22 74.4 kg (164 lb)   07/05/22 73 kg (161 lb)   09/14/21 74.8 kg (165 lb)      Mallampati Score: 2  ASA Classification: 2  Constitutional: Well-appearing.  No acute distress.  Eyes: Normal eyelids and conjunctivae, pupils equal round and reactive to light  Ears, Nose, Mouth, Throat: Atraumatic, normocephalic, normal lips, teeth, and gum, moist mucous membranes  Neck:  Neck supple, no lymphadenopathy  Respiratory:  Normal respiratory effort, no accessory muscle use or intercostal retraction, no dullness to percussion, clear to auscultation bilaterally  Cardiovascular:  Regular rate and rhythm, normal s1/s2, no lower extremity edema  Gastrointestinal:  Soft, nontender, nondistended, no masses, no hepatosplenomegaly, no guarding, no rebound    Hem/Lymphatic: No lymphadenopathy of neck or axillae  Muskuloskeletal: No clubbing or cyanosis of hands.    Skin:  No rashes, ulcers or lesions.  No nodules, scaling or induration.  Neurologic:  Gait normal  Psychiatric: Oriented to time, place, and person.  Normal mood and affect.     I have personally reviewed the following blood tests listed below:     Latest [facility]:  No results found for: "WBC", "RBC", "HGB", "HCT", "MCV", "MCH", "MCHC", "PLT", "CBCD"  No results found for: "ALT", "AST", "ALKP", "DBILI", "TBILI", "ANA4", "TBILWB", "GGT", "GGTEXL", "GGTEXQ", "ANA5"   No results found for: "AMY"  No results found for: "LIPA"  No results found for: "CRP", "CRPEXL", "CRPEXQ"       I have reviewed the studies below     Latest imaging and other diagnostics:       Last Colonoscopy:    _______________________________  ASSESSMENT AND PLAN: colonoscopy for abdominal pain, change in bowel habit, history of colon polyps  Plan for: Anesthesia Consult    Immediate Pre-Sedation Assessment Completed including response to Pre-Medication.    Airway Status Unchanged - Cleared for Sedation and Procedure    DOCUMENTATION OF INFORMED CONSENT  I have discussed the risks, benefits, and alternatives of the procedure and sedation with the patient and/or the patient's medical decision-maker.  This discussion included,  but was not limited to, the risk of bleeding, infection, damage to anatomical structures, need for reoperation, or even death.  The patient and/or the patient's medical decision maker understands, has had all of his/her questions answered, and desires to proceed.  Informed consent obtained.

## 2022-08-22 MED ORDER — ONDANSETRON HCL 8 MG TABLET
8 | ORAL_TABLET | ORAL | 0 refills | Status: DC
Start: 2022-08-22 — End: 2023-01-29

## 2022-08-22 MED ORDER — SIMETHICONE 125 MG CHEWABLE TABLET
125 | ORAL_TABLET | ORAL | 0 refills | Status: DC
Start: 2022-08-22 — End: 2023-01-29

## 2022-08-22 NOTE — Telephone Encounter (Signed)
Returned call to pt to discuss her question.     Pt stated she was scheduled colo in June and asked nurse to check if she is still on the schedule for colo in June.     Reviewed chart, no colo is scheduled currently. Message will be forwarded to Dr. [name] for further advice.

## 2022-08-24 NOTE — Telephone Encounter (Signed)
Called pt to discuss bowel prep. Pt said she did not received bowel prep instruction.     She was taking solid food today. The last food she had was a small piece of chicken at 2:30pm.    Pt was advised to stop all solid food but clear liquid diet only and no red or purple color.     Reviewed bowel prep including simethicone, zofran and Golytely step by step.     Pt will proceed colo with MAC, arrival time 8:30am as scheduled.     All questions were answered. Pt verbalized understanding and appreciation.

## 2022-08-24 NOTE — Patient Instructions (Addendum)
We want your upcoming procedure visit to be as safe and comfortable as possible. The following instructions are designed to prepare you for your procedural hospitalization.  Please read and follow all instructions carefully.        Procedure Location  Your procedure is scheduled to take place at [facility]: [address] 1st Floor Report to Room L103 at assigned ARRIVAL time noted below. Phone [phone]    Procedure Date and Time  Please arrive on 08/25/2022 .       Contact your surgeon's office if you have not received your time of arrival for the day surgery    If for any reason your procedure start time is changed, you will receive a call from your surgeons office.  Should you have any questions regarding the date or time of arrival, please call your surgeons office and ask to speak with his/her practice assistant     Jana Half [phone]       Preparing for Your Procedure  What can I eat or drink on the day of the procedure?  Please do not have anything to eat or drink except clear liquids after midnight the evening before your procedure (including gum, candy or mints).  You may have clear liquids on the day of your procedure up to 2 hours prior to arrival.  Clear liquids include:  Non-pulp, clear apple juice.  Tea with sugar or sweetener (NO milk, cream, or milk substitute)  Gatorade  Water  If you drink anything other than clear liquids on the day of your procedure, or if you have ANYTHING to drink in the two hours prior to hospital arrival, then your doctors will cancel your procedure for your safety.  If you have been instructed to take any medications the day of your procedure, take them with a small sip of water.    If your surgeon has given you additional instructions about what to eat or drink before the day of your procedure, please follow those instructions as well.    Which medications should I take on the day of the procedure?     Medication List             Accurate as of Aug 24, 2022  2:42 PM. Always use your most recent med list.                Medication Information        Take Morning of Surgery Special Instructions Other   albuterol 90 mcg/actuation metered dose inhaler  Inhale into the lungs every 6 (six) hours as needed for Wheezing  Commonly known as: PROVENTIL HFA;VENTOLIN HFA     Use as needed           bismuth subsalicylate 262 mg Tab  Take 1 tablet by mouth 3 (three) times daily as needed (for foul smelling stool)     Do not take on the morning of procedure or surgery           budesonide 3 mg 24 hr capsule  TAKE 2 CAPSULES BY MOUTH EVERY MORNING  Commonly known as: ENTOCORT EC     Use as needed           calcium carbonate-vitamin D 600 mg-10 mcg (400 unit) tablet     Do not take on the morning of procedure or surgery             cholecalciferol (vitamin D3) 1000 UNITS tablet  Take 1  tablet (1,000 Units total) by mouth daily     Do not take on the morning of procedure or surgery           docusate sodium 100 mg capsule  Take 1 capsule (100 mg total) by mouth daily  Commonly known as: COLACE     Do not take on the morning of procedure or surgery           esomeprazole 40 mg capsule  Take 1 capsule (40 mg total) by mouth every morning before breakfast  Commonly known as: NEXIUM     Take as scheduled           estrogens (conjugated) 0.625 mg tablet  Commonly known as: PREMARIN     Take as scheduled           HYDROcodone-acetaminophen 5-325 mg tablet  Take 1 tablet by mouth  Commonly known as: NORCO     Use as needed           hydrocortisone 5 mg tablet  Take 2 tabs by mouth in the morning and 1 tab by mouth in the afternoon between 2pm-3pm  Commonly known as: CORTEF     Take as scheduled           hydrOXYzine 25 mg tablet  Take 1 tablet (25 mg total) by mouth every 6 (six) hours as needed  Commonly known as: ATARAX     Use as needed           loratadine 10 mg tablet  Take 1 tablet (10 mg total) by mouth daily  Commonly known as: CLARITIN     Use as needed            LORazepam 0.5 mg tablet  Take 1 tablet (0.5 mg total) by mouth every 30 (thirty) minutes as needed for Anxiety (prior to imaging study or procedure) for up to 2 doses  Commonly known as: ATIVAN     Use as needed           mesalamine 1.2 gram EC tablet  TAKE 1 TABLET BY MOUTH EVERY DAY  Commonly known as: LIALDA     Do not take on the morning of procedure or surgery           miconazole 2 % cream  Apply topically Twice a day to affected area on vagina.  Commonly known as: MICOTIN     Use as needed           * ondansetron 8 mg tablet  Take 8 mg po every 6- 8 hours up to two doses, as needed for nausea related to colonoscopy prep  Commonly known as: ZOFRAN     Use as needed                         oxybutynin 10 mg 24 hr tablet  Take 1 tablet (10 mg total) by mouth daily  Commonly known as: DITROPAN XL     Take as scheduled           polyethylene glycol 17 gram/dose powder  DISSOLVE 17 GRAMS INTO WATER AND DRINK BY MOUTH EVERY DAY  Commonly known as: MIRALAX     Do not take on the morning of procedure or surgery           pregabalin 75 mg capsule  Take 1 capsule (75 mg total) by mouth daily as needed  Commonly known as: LYRICA  Take as scheduled           PROBIOTIC ORAL  Take by mouth daily     Do not take on the morning of procedure or surgery           QVAR REDIHALER 80 mcg/actuation breath activated inhaler  Generic drug: beclomethasone     Take as scheduled           * simethicone 125 mg chewable tablet  PLEASE PURCHASE OVER THE COUNTER. PLEASE TAKE 3 TABLETS (approx. 400 mg) THE NIGHT BEFORE THE PROCEDURE and 3 TABLETS (approx. 400 mg) THE MORNING OF WHILE DRINKING THE BOWEL PREP.  Commonly known as: MYLICON        Use as instructed by surgeon                            * This list has 4 medication(s) that are the same as other medications prescribed for you. Read the directions carefully, and ask your doctor or other care provider to review them with you.                Avoid Aspirin, Advil, Motrin,  Ibuprofen, Aleve, Naproxen, Excedrin, meloxicam (Mobic), diclofenac (Voltaren), celecoxib (Celebrex), Fish Oil, Omega 3, glucosamine/chondroitin, and Vitamin E, ginkgo biloba, garlic, turmeric/curcumin, herbal medicines, and flax seeds oil seven days before surgery to prevent prolonged bleeding.  You may take acetaminophen (Tylenol) if needed for pain.     If you are using a CPAP machine at home, please bring it to the hospital on the day of the procedure.     Please notify us if you have any device/pump in your body.  If you are using an insulin pump, please bring tubing and any equipment that goes a long with the pump on the day of procedure.  If you have pain pump, please bring the activator on the day of the procedure. If you have a spinal stimulator, please bring the remote control on the day of the procedure.     If you smoke, please quit smoking as it will affect your respiratory function and wound healing process.   Nicotine reduces blood flow to the skin and can cause significant complications during healing.     Please see showering instructions below.  If your procedure requires you to shower with Hibiclens soap, you can pick it up for free at the [facility] on [address] on the third floor or in the [facility] at [employer] (2nd floor, room A2100); we are open M-F 8-4:30PM.  Otherwise you can purchase it at your local pharmacy.     Preparing to Come to the [facility]  Showering Instructions:  Shower the morning of surgery. After showering, do not apply lotion, cream, powder, deodorant, or hair conditioner.   Do not shave or remove body hair. Shaving your face is generally fine. If you are having head surgery, however, ask your doctor whether you can shave.      Wear casual, loose fitting, comfortable clothing and leave all valuables, including jewelry, and large sums of cash at home.  Leave your valuables at home. Belongings that remain with you are your  responsibility. [facility] is not liable for loss or damage. If valuables are brought to the hospital, they will be identified and recorded by our admitting team. All jewelry must be removed and left at home. If rings cannot be removed, we encourage you  to have them removed by a jeweler prior to your procedure to avoid damage or loss of ring.  [facility] is not responsible of lose or damaged jewelry. This policy protects the patient and prevents the items from being lost or damaged.   Leave contact lenses at home. Wear your eyeglasses and bring a case.  If you develop any illness prior to the procedure (fever, cough, sore throat, cold, flu, infection), OR START A NEW MEDICATION, please call your surgeon and the [facility] at [phone].  If you are spending the night you may bring toiletries and sleeping clothes if you desire; otherwise the hospital will provide them for you.   DO NOT bring your medications with you to the hospital unless you were specifically instructed to do so.  DO bring a list of your medications including dose(s) and when you take them.  DO bring TWO forms of ID - including one ID with a photo.    Leaving the Hospital  Please ask your surgeon about your anticipated length of stay.  Please make sure your ride is available to pick you up by 12N on day of discharge  It is recommended that all patients have a responsible person at home the first night after discharge from the hospital.  ALL patients, including same day procedure patients, must arrange for an adult to drive/escort them home upon discharge.  Patients going home the same day of their procedure will have their procedure cancelled if these arrangements are not made ahead of time.    Family and Friends  Friends and family may wait in the assigned waiting areas.  Patient care coordinators are available in these patient waiting areas to provide updates regarding patient progress; hospital room assignments; and discharge  planning (for same day procedures).        Guided Imagery  Research has shown that listening to guided imagery is helpful for many health conditions.  [facility] offers these sessions to listen to at the following website: [email or web address]     Please consider guided imagery to prepare for your procedure and for coping with stress, sleep and pain.      Visitor Information:  For additional visitor information please go to [email or web address]

## 2022-08-24 NOTE — Anesthesia Pre-Procedure Evaluation (Addendum)
[facility]  Anesthesia Preprocedure Evaluation    Procedure Information       Case: [id number] Date/Time: 08/25/22 4540    Procedure: ENDO ADULT COLONOSCOPY WITH BIOPSY    Diagnosis: Segmental colitis associated with diverticulosis (CMS code) [K50.10, K57.30]    Pre-op diagnosis: Segmental colitis associated with diverticulosis (CMS code) [K50.10, K57.30]    Location: [facility] /  [facility]    Surgeons: Ivory Broad, MD            Precautions            None            Relevant Problems   No relevant active problems         Anesthesia Encounter History        CC/HPI/Past Medical History Summary: Chelsea Ball is a 62 y.o. female h/o Segmental colitis associated with diverticulosis. S/f ENDO ADULT COLONOSCOPY WITH BIOPSY w/ Dr. [name] 08/25/22    PMH  - Asthma, uses inhalers PRN. No steroids/hospitalizations  - GERD/esomeprazole  - Bipolar affective disorder  -active smoker    (Please refer to APeX Allergies, Problems, Past Medical History, Past Surgical History, Social History, and Family History activities, Results for current data from these respective sections of the chart; these sections of the chart are also summarized in reports, including the Patient Summary Extracts found in Chart Review)        Summary of Prior Anesthetics: Previous anesthetic with [facility]        Patient Active Problem List   Diagnosis    Anxiety    Asthma    Back pain    Bipolar 1 disorder (CMS code)    Cervical stenosis of spinal canal    Chronic low back pain with bilateral sciatica    Depression    Eczema    GERD (gastroesophageal reflux disease)    Gastroduodenitis    Drug-induced constipation    Bilateral carpal tunnel syndrome    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)    Moderate persistent asthma    Neck pain    Neuropathy, 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, right    Osteoarthritis of shoulder    Paresthesia and pain of both upper extremities    Polyarthritis    Schizophrenia (CMS code)     Stress incontinence in female    Tobacco user    Vitamin D deficiency    Abdominal pain    History of colonoscopy    Segmental colitis associated with diverticulosis (CMS code)     Past Medical History:   Diagnosis Date    Abdominal pain 01/21/2019    Bipolar 1 disorder (CMS code) 01/21/2019    Depression 01/21/2019    GERD (gastroesophageal reflux disease) 05/04/2016    History of motion sickness     PONV (postoperative nausea and vomiting)      Past Surgical History:   Procedure Laterality Date    CHOLECYSTECTOMY  04/26/2017    HYSTERECTOMY      right open carpal tunnel release (Right Wrist)  05/15/2018     Allergies/Contraindications   Allergen Reactions    Metronidazole Other (See Comments)     Flares up yeast     Current Medications         Dosage    estrogens, conjugated, (PREMARIN) 0.625 mg tablet     hydrocortisone (CORTEF) 5 mg tablet Take 2 tabs by mouth in the morning and 1 tab by mouth in  the afternoon between 2pm-3pm    oxybutynin (DITROPAN XL) 10 mg 24 hr tablet Take 1 tablet (10 mg total) by mouth daily    albuterol 90 mcg/actuation metered dose inhaler Inhale into the lungs every 6 (six) hours as needed for Wheezing    beclomethasone (QVAR REDIHALER) 80 mcg/actuation breath activated inhaler     bismuth subsalicylate 262 mg TAB Take 1 tablet by mouth 3 (three) times daily as needed (for foul smelling stool)    budesonide (ENTOCORT EC) 3 mg 24 hr capsule TAKE 2 CAPSULES BY MOUTH EVERY MORNING    calcium carbonate-vitamin D 600 mg(1,500mg ) -400 unit tablet     cholecalciferol, vitamin D3, 1000 UNITS tablet Take 1 tablet (1,000 Units total) by mouth daily    docusate sodium (COLACE) 100 mg capsule Take 1 capsule (100 mg total) by mouth daily    esomeprazole (NEXIUM) 40 mg capsule Take 1 capsule (40 mg total) by mouth every morning before breakfast    HYDROcodone-acetaminophen (NORCO) 5-325 mg tablet Take 1 tablet by mouth    hydrOXYzine (ATARAX) 25 mg tablet Take 1 tablet (25 mg total) by mouth every 6  (six) hours as needed    Lactobacillus acidophilus (PROBIOTIC ORAL) Take by mouth daily    loratadine (CLARITIN) 10 mg tablet Take 1 tablet (10 mg total) by mouth daily    LORazepam (ATIVAN) 0.5 mg tablet Take 1 tablet (0.5 mg total) by mouth every 30 (thirty) minutes as needed for Anxiety (prior to imaging study or procedure) for up to 2 doses    mesalamine (LIALDA) 1.2 gram EC tablet TAKE 1 TABLET BY MOUTH EVERY DAY    miconazole (MICOTIN) 2 % cream Apply topically Twice a day to affected area on vagina.    ondansetron (ZOFRAN) 8 mg tablet Take 8 mg po every 6- 8 hours up to two doses, as needed for nausea related to colonoscopy prep    ondansetron (ZOFRAN) 8 mg tablet To be taken as needed for nausea ( see colonoscopy prep instructions for details)    polyethylene glycol (MIRALAX) 17 gram/dose powder DISSOLVE 17 GRAMS INTO WATER AND DRINK BY MOUTH EVERY DAY    polyethylene glycol-electrolytes (GOLYTELY) 236-22.74-6.74 -5.86 gram solution (Expired) Take 4,000 mL by mouth once for 1 dose please call patient to ship    pregabalin (LYRICA) 75 mg capsule Take 1 capsule (75 mg total) by mouth daily as needed    simethicone (MYLICON) 125 mg chewable tablet PLEASE PURCHASE OVER THE COUNTER. PLEASE TAKE 3 TABLETS (approx. 400 mg) THE NIGHT BEFORE THE PROCEDURE and 3 TABLETS (approx. 400 mg) THE MORNING OF WHILE DRINKING THE BOWEL PREP.    simethicone (MYLICON) 125 mg chewable tablet PLEASE PURCHASE OVER THE COUNTER. PLEASE TAKE 3 TABLETS (approx. 400 mg) THE NIGHT BEFORE THE PROCEDURE and 3 TABLETS (approx. 400 mg) THE MORNING OF WHILE DRINKING THE BOWEL PREP.          Ht 154.9 cm (5\' 1" )   Wt 75.3 kg (166 lb)   BMI 31.37 kg/m        Review of Systems Functional Status: Climb a flight of stairs or walk up a hill (5.50 METs) Climbs FOS daily w/o chest pain and SOB.  Constitutional:  Negative for activity change, chills, fever and unexpected weight change.   Airway:  Positive for dental hardware (full dentures). Negative  for limited neck movement, difficulty opening mouth, loose teeth, TMJ problem, snoring and CPAP  HENT:  Positive for trouble swallowing. Negative for sore  throat and voice change.   Eyes:  Negative for visual disturbance.   Respiratory:  Negative for cough, Recent URI Symptoms, shortness of breath and wheezing.    Cardiovascular:  Negative for leg swelling, orthopnea, palpitations and chest pain.   Gastrointestinal:  Positive for abdominal pain and GERD Symptoms (on PPI). Negative for nausea and vomiting.   Genitourinary:  Negative for dysuria, frequency and hematuria.   Musculoskeletal:  Negative for neck pain and back pain.        Laying flat supine w/o any problems   Skin:  Negative for rash and wound.   Neurological:  Negative for headaches, syncope, seizures and dizziness.   Hematological:  Does not bruise/bleed easily.       Physical Exam    Airway:    Modified Mallampati score: II. Thyromental distance: > 6.5 cm. Mouth opening: fair.  Neck range of motion: limited (Stiff).   HENT:      Mouth/Throat:      Dentition: None present.   Cardiovascular:   Cardiovascular exam normal.  Pulmonary:   Pulmonary exam normal  Neurological:   Neurological system normal.    Dental:    Patient is edentulous.                 Prepare (Pre-Operative Clinic) Assessment/Plan/Narrative  Prepare Clinic consult type: Telephone  Phone consult with patient 08/24/2022, please do PE on DOS.  Verbal instructions given and AVS emailed, patient prefers non-secure method. - D.H.    Dr. [name]:     Phone preop eval completed today 08/24/22 w/ me. Pt has not received any info reg: bowel prep. She has been eating today and wants to know how to prep for colonoscopy tomorrow. Please have your team reach out to her to explain.           Obstructive Sleep Apnea Screening  STOPBANG Score:      S Do you Snore loudly (louder than talking or loud  enough to be heard through closed doors)? No    T Do you often feel Tired, fatigued, or sleepy during  daytime? No    O Has anyone Observed you stop breathing during  your sleep? No    P Do you have or are you being treated for high blood Pressure? No    B BMI more than 35 KG/m^2? No    A Age over 66 years old? Yes    N Neck circumference > 16 inches (40cm)?     G Gender: Female? No    STOPBANG Total Score 1         Risk Level (based only on STOPBANG) Low - Incomplete       CPAP/BiPAP prescribed - No.                Anesthesia Assessment and Plan  Day Of Surgery Provider Chart Review:  NPO status verified  medications reviewed  allergies reviewed  problem list reviewed  anesthesia history reviewed  pertinent labs reviewed  consults reviewed    ASA 2       Anesthesia Plan  Anesthesia Type: MAC  Induction Technique: IntraVenous  Invasive Monitors/Vascular Access: none  Airway Plan: nasal cannula  Planned Recovery Location: Endoscopy Recovery Area    Blood Product Preparation  Blood Products Plan:  N/A, minimal risk    Anesthesia Potential Complication Discussion  At increased or higher than average risk of: Dental damage, Post-operative nausea and vomiting, Pain, Sore throat, Conversion to GA and Sedation encompasses  a spectrum which may include awareness and recall    Informed Consent for Anesthesia  Consent obtained from patient    Risks, benefits and alternatives including those of invasive monitoring discussed. Increased risks (as above) discussed.  Questions invited and all answered.  Interpreter: N/A - patient/guardian's preferred language is [place]  Consent granted for anesthetic plan    Quality Measure Documentation   Opioid Therapy Planned? No    (See Anesthesia Record for attending attestation)    [Please note, smart link data included in this note may not reflect changes since note creation. Please see appropriate section of APeX for up-to-the minute information.]

## 2022-08-25 MED ORDER — ACETAMINOPHEN 500 MG GELCAP
500 | Freq: Once | ORAL | Status: DC | PRN
Start: 2022-08-25 — End: 2022-08-25

## 2022-08-25 MED ORDER — GLYCOPYRROLATE 0.2 MG/ML INJECTION SOLUTION
0.2 | Freq: Once | INTRAMUSCULAR | Status: DC | PRN
Start: 2022-08-25 — End: 2022-08-25
  Administered 2022-08-25: 17:00:00 0 via INTRAVENOUS

## 2022-08-25 MED ORDER — ONDANSETRON HCL (PF) 4 MG/2 ML INJECTION SOLUTION
4 | Freq: Once | INTRAMUSCULAR | Status: DC | PRN
Start: 2022-08-25 — End: 2022-08-25
  Administered 2022-08-25: 17:00:00 4 via INTRAVENOUS

## 2022-08-25 MED ORDER — SODIUM CHLORIDE 0.9 % INTRAVENOUS SOLUTION: 0.9 % | INTRAVENOUS | Status: DC

## 2022-08-25 MED ORDER — PROPOFOL 10 MG/ML INTRAVENOUS EMULSION FOR OR
10 | Freq: Once | INTRAVENOUS | Status: DC | PRN
Start: 2022-08-25 — End: 2022-08-25
  Administered 2022-08-25: 17:00:00 100 via INTRAVENOUS
  Administered 2022-08-25: 17:00:00 180 via INTRAVENOUS

## 2022-08-25 MED ORDER — LACTATED RINGERS INTRAVENOUS SOLUTION
INTRAVENOUS | Status: DC | PRN
Start: 2022-08-25 — End: 2022-08-25

## 2022-08-25 MED ORDER — FENTANYL (PF) 50 MCG/ML INJECTION SOLUTION
50 | INTRAMUSCULAR | Status: DC | PRN
Start: 2022-08-25 — End: 2022-08-25

## 2022-08-25 MED ORDER — LIDOCAINE (PF) 10 MG/ML (1 %) INJECTION SOLUTION: 10 mg/mL (1 %) | INTRAMUSCULAR | Status: DC

## 2022-08-25 MED ORDER — SODIUM CHLORIDE 0.9 % INTRAVENOUS SOLUTION
0.9 % | INTRAVENOUS | Status: DC | PRN
  Administered 2022-08-25: 17:00:00 via INTRAVENOUS

## 2022-08-25 MED ORDER — OXYCODONE 5 MG TABLET
5 | Freq: Once | ORAL | Status: DC | PRN
Start: 2022-08-25 — End: 2022-08-25

## 2022-08-25 MED ORDER — HYDROMORPHONE (PF) 0.5 MG/0.5 ML INJECTION SYRINGE
0.5 | INTRAMUSCULAR | Status: DC | PRN
Start: 2022-08-25 — End: 2022-08-25

## 2022-08-25 MED ORDER — ONDANSETRON HCL (PF) 4 MG/2 ML INJECTION SOLUTION
4 | Freq: Four times a day (QID) | INTRAMUSCULAR | Status: DC | PRN
Start: 2022-08-25 — End: 2022-08-25

## 2022-08-25 NOTE — Discharge Instructions (Addendum)
If you have been given medications to sedate you for your procedure(s), you must have an adult to accompany you home.  These medications will impair your ability to drive, so you cannot drive until the day after the procedure.    Diet:  Regular diet unless specified otherwise.  Eat a small first meal of any foods that you know your body tolerates well.    Activities:  Resume your regular activities tomorrow unless specified otherwise.    Today you received medications that made you sleepy during your procedure.    The following items are recommended for your care during the next 24 hrs.  Do NOT drive or operate heavy machinery until tomorrow.  Do not consume alcohol for 24 hrs.  Do not make important decisions for 24 hrs.  Rest the remainder of the day.    Procedure Discharge Instructions:   COLONOSCOPY   You may experience cramping, bloating, or flatulence (gas).  If your doctor took biopsies, your first bowel movements may show a small amount of blood (a few tablespoons). This is normal. Notify your doctor if the amount of blood appears to be more.  Notify your doctor if you:    Pass bloody or black stool.  Have constant abdominal pain.  Have a temperature over 101 degrees orally.    Medications:   You may resume taking your regularly scheduled and over-the-counter medications today.      Other Instructions: see report  If a biopsy was taken or a polyp was removed, please contact the doctor for results in 7-10 days at the number below or on MyChart.    If anything concerns you, please call your physician, Dr. Edwin Cap at [phone]  If you are unable to reach your doctor, call [facility] at [phone] and ask for the GI Fellow on call.

## 2022-08-25 NOTE — H&P (Signed)
Procedure Planned: Colonoscopy  Diagnosis: Abdominal pain    Chief Complaint: Abdominal pain    Past Medical History:   Diagnosis Date    Abdominal pain 01/21/2019    Bipolar 1 disorder (CMS code) 01/21/2019    Depression 01/21/2019    GERD (gastroesophageal reflux disease) 05/04/2016    History of motion sickness     PONV (postoperative nausea and vomiting)      Past Surgical History:   Procedure Laterality Date    CHOLECYSTECTOMY  04/26/2017    HYSTERECTOMY      right open carpal tunnel release (Right Wrist)  05/15/2018     Allergies/Contraindications   Allergen Reactions    Metronidazole Other (See Comments)     Flares up yeast      No medications prior to admission.       Social History  She   reports that she has been smoking cigarettes. She has a 10 pack-year smoking history. She has never used smokeless tobacco. She reports current alcohol use of about 4.0 - 5.0 standard drinks of alcohol per week. She reports that she does not currently use drugs.   Family History  family history is not on file.   Family history is otherwise negative or as noted above.      Physical Exam:  Vss:  There were no vitals taken for this visit.   Wt Readings from Last 3 Encounters:   08/24/22 75.3 kg (166 lb)   07/22/22 74.4 kg (164 lb)   07/05/22 73 kg (161 lb)      Mallampati Score: 2  ASA Classification: 2  Constitutional: Well-appearing.  No acute distress.  Eyes: Normal eyelids and conjunctivae  Ears, Nose, Mouth, Throat: Atraumatic, normocephalic, normal lips, teeth, and gum, moist mucous membranes  Neck:  Neck supple, no lymphadenopathy  Respiratory:  Normal respiratory effort, clear to auscultation bilaterally  Cardiovascular:  Regular rate and rhythm, normal s1/s2, no lower extremity edema  Gastrointestinal:  Soft, nontender, nondistended, no masses, no hepatosplenomegaly, no guarding, no rebound   Skin:  No rashes, ulcers or lesions.  Neurologic: Oriented to time, place, and person.   Psychiatric: Normal mood and  affect.     I have personally reviewed the following blood tests listed below:     Latest [facility]:  No results found for: "WBC", "RBC", "HGB", "HCT", "MCV", "MCH", "MCHC", "PLT", "CBCD"  No results found for: "ALT", "AST", "ALKP", "DBILI", "TBILI", "ANA4", "TBILWB", "GGT", "GGTEXL", "GGTEXQ", "ANA5"   No results found for: "AMY"  No results found for: "LIPA"  No results found for: "CRP", "CRPEXL", "CRPEXQ"       I have reviewed the studies below     Latest imaging and other diagnostics:       Last Colonoscopy:  Results for orders placed or performed during the hospital encounter of 09/20/20   Endoscopy, colon, diagnostic    Impression    COLONOSCOPY PROCEDURE REPORT     EXAM DATE: 09/20/2020    PATIENT NAME:      Chelsea Ball, Chelsea Ball           MR #:      [id number]  BIRTHDATE:       1960-07-04  ATTENDING:     Edwin Cap M.D.  ASSISTANT:    INDICATIONS:  The patient is a 62 yr old female here for a colonoscopy  due to Abdominal pain and bright red blood per rectum  PROCEDURE PERFORMED:     Colonoscopy, diagnostic, Colonoscopy with  snare,  and Colonoscopy with EMR  MEDICATIONS:  Nasal oxygen,   MAC    DESCRIPTION OF PROCEDURE:  During intra-op preparation period all  mechanical & medical equipment was checked for proper function. Hand  hygiene and appropriate measures for infection prevention was taken.  A physicial examination was performed.  After the risks, benefits and  alternatives of the procedure and sedation were thoroughly explained,  informed consent was verified, confirmed and timeout was successfully  executed by the treatment team.  The [id number] ([id number]) endoscope was  introduced through the anus and advanced to the cecum, which was  identified by both the appendiceal orifice and ileocecal valve.  The  scope was then completely withdrawn from the patient and the  procedure terminated.    QUALITY OF PREP:  Adequate for the indication and for the standard  follow up guidelines The [place] Bowel Prep Score was  [facility] Scale  Right colon 3, Transverse colon 3, and Left colon 3.  Total BBPS = 9.      A digital rectal exam was performed and revealed no abnormalities of  the rectum.  COLON FINDINGS: A 11 mm sessile polyp was found in the ascending  colon.  Endoscopic mucosal resection was performed by injecting  saline into the submucosa to raise the lesion.  Resection was then  performed with snare cautery.  The polyp lifted well after submucosal  injection.  The resection was complete, the polyp tissue was  completely retrieved and sent to histology.  There was no 'target  sign' at EMR site.  There was no blood loss from polypectomy.   A 5  mm sessile polyp was found in the ascending colon.  A polypectomy was  performed using a cold snare.  The resection was complete and the  polyp tissue was completely retrieved.   Four 2 to in size  sessile polyps were found in the sigmoid colon.  A polypectomy was  performed with a cold snare.  The resection was complete and the  polyp tissue was completely retrieved.   Three 2 mm superficial  elevated polyps were found in the rectum.  A polypectomy was  performed with a cold snare.  The resection was complete and the  polyp tissue was completely retrieved.   There was severe  non-bleeding diverticulosis noted in the sigmoid colon.   Retroflexed  views revealed internal hemorrhoids.  Otherwise the examination was  normal.        ESTIMATED BLOOD LOSS:     None  COMPLICATIONS:      There were no complications.  IMPRESSIONS:     1.  11 mm sessile polyp was found in the ascending  colon; endoscopic mucosal resection was performed  2.  5 mm sessile polyp was found in the ascending colon; polypectomy  was performed  3.  Four 2 to 4mm mm in size polyps were found in the sigmoid colon;  polypectomy was performed with a cold snare  4.  Three 2 mm polyps were found in the rectum; polypectomy was  performed with a cold snare  5.  Severe non-bleeding diverticulosis was noted in the sigmoid  colon  6.  Retroflexed views revealed internal hemorrhoids    RECOMMENDATIONS:     1.  I suspect the recent bleeding was from  internal hemorrhoids.  The diverticulosis did not have a lot of  inflammation today visually.  I recommend slowly going off of the  budesonide medicine since there is not much visible inflammation  but  continuing the mesalamine.  2.  Avoid NSAIDS (over the counter pain killers, Aspirin, Ibuprofen,  Aleve, Motrin etc.) as well as blood thinners (Heparin, Coumadin  etc.) 7 day(s)  REPEAT COLONOSCOPY:     Polyps were found, therefore your next  colonoscopy will be determined based on your pathology results, and a  MyChart message will be sent to you with results and recommendations.      The attending physician performed the entire procedure.    _____________________________  Edwin Cap M.D.  Activated:  09/20/2020 14:31      cc:            Reviewed by:     Edwin Cap M.D.        PATIENT NAME:  Chelsea Ball, Chelsea Ball  MR#: [id number]              FINAL PATHOLOGIC DIAGNOSIS     A. Ascending colon, polyp, endoscopic mucosal resection:  Tubular  adenoma.     B. Ascending colon, polyp, biopsy:  Fragments of tubular adenoma.     C. Sigmoid colon, polyps, biopsy:  Fragments of tubular adenomas.      D. Rectum, polyps, biopsy:  Fragments of hyperplastic polyps.    _______________________________  ASSESSMENT AND PLAN:     14F with IBS-C, prior diverticulitis, s/p CCY 2019, MDD, chronic opiate use, s/p hysterectomy,  9 adenomatous polyps on 6/22 colo including 11mm AC TA s/p EMR, abdominal pain here for repeat colonoscopy.    Plan for: Anesthesia Consult    Immediate Pre-Sedation Assessment Completed including response to Pre-Medication.    Airway Status Unchanged - Cleared for Sedation and Procedure    DOCUMENTATION OF INFORMED CONSENT  I have discussed the risks, benefits, and alternatives of the procedure and sedation with the patient and/or the patient's medical decision-maker.  This discussion  included, but was not limited to, the risk of bleeding, infection, damage to anatomical structures, need for reoperation, or even death.  The patient and/or the patient's medical decision maker understands, has had all of his/her questions answered, and desires to proceed.  Informed consent obtained.

## 2022-08-25 NOTE — Anesthesia Post-Procedure Evaluation (Signed)
Anesthesia Post-op Evaluation    Scheduled date of Operation: 08/25/2022    Scheduled Surgeon(s):  Ivory Broad, MD  Teryl Lucy, MD  Scheduled Procedure(s):  ENDO ADULT COLONOSCOPY WITH POLYPECTOMY    Final Anesthesia Type: MAC    Assessment  Respiratory Function:      Airway Patency: Excellent      Respiratory Rate: See vitals below      SpO2: See vitals below      Overall Respiratory Assessment: Stable  Cardiovascular Function:      Pulse Rate: See Vitals Below      Blood Pressure: See Vitals Below      Cardiac status: Stable  Mental Status:      RASS Score: 0 Alert or calm  Temperature: Normothermic  Pain Control: Adequate  Nausea and Vomiting: Absent  Fluids/Hydration Status: Euvolemic    Complications (anesthesia/case associated complications, possible complications, and/or significant issues; as of time of note completion: No apparent complications      Plan  Follow-up care: As per primary team    Post-op Note Status: Complete, patient participated in evaluation, which occurred after recovery from anesthesia but prior to 48 hours from end of case        Recent Pre-op and Post-op Vital Signs  Vitals:    08/25/22 0847   BP: 114/79   Pulse: 80   Resp: 16   Temp: 36 C (96.8 F)   TempSrc: Temporal   SpO2: 97%     Last Vital Signs Out of Room to Anesthesia Stop  Vitals Value Taken Time   Pulse     Resp     SpO2 99 % 08/25/22 1044   BP 151/126 08/25/22 1044   Arterial Line BP (mmHg)      Arterial Line MAP (mmHg)     Arterial Line 2 BP (mmHg)     Arterial Line 2 MAP (mmHg)     Temp     Temp src     Vitals shown include unfiled device data.    Case Tracking Events:  Event Time In   In Facility [phone]   In SWA 0827   In Pre-op 0828   Anesthesia Start 0955   Anesthesia Ready 1001   In Room 0955   Procedure Start 1001   Cecum Reached (Endo) 1018   Procedure Finish 1038   Out of Room 1041   In PACU 1043   Anesthesia Finish 1045

## 2022-08-29 LAB — SURGICAL PATHOLOGY
Pathologist: 49370
Specimen Class: 50
Subspecialty: 8

## 2022-09-12 NOTE — Telephone Encounter (Signed)
Reviewed chart.   Last visit:  07/05/22  Last EGD/colonoscopy:   Last lab test: 04/20/2021    Refill criteria:       Related visit or procedure in last 12  months   GFR result > 60 in last 12 months    Lab  is needed for refill.     It doesn't meet nursing refill protocol.

## 2022-09-19 ENCOUNTER — Telehealth: Admit: 2022-09-21 | Payer: MEDICAID | Attending: "Endocrinology

## 2022-09-19 DIAGNOSIS — E2740 Unspecified adrenocortical insufficiency: Secondary | ICD-10-CM

## 2022-09-19 MED ORDER — HYDROCORTISONE 5 MG TABLET
5 mg | ORAL_TABLET | ORAL | 1 refills | Status: DC
Start: 2022-09-19 — End: 2023-01-16

## 2022-09-19 NOTE — Telephone Encounter (Signed)
Patient calling in wanting to discuss with Dr. [name] regarding antibiotics. Patient said that her current antibiotics making her cold flare and has a smell. Please advise     Best contact number: [phone]    Thank you,  [facility]

## 2022-09-19 NOTE — Progress Notes (Signed)
Date of Service: 09/19/2022  Provider: Michiel Sites, MD PhD  Patient verbally consented to a Telehealth Video Visit: Yes    VIDEO TELEHEALTH RETURN VISIT     VIDEO TELEHEALTH VISIT: This clinician is part of the telehealth program and is conducting this visit in a currently approved location. For this visit the clinician and patient were present via interactive audio & video telecommunications system that permits real-time communications, via the [employer].    Patient's use of the telehealth platform followed consent and acknowledges agreement to permit telehealth for this visit.     State patient was in at time of video visit: [place]     Other individuals present during the telehealth encounter and their role/relation: none     Because this visit was completed over video, a hands-on physical exam was not performed.  Patient/parent or guardian understands and knows to call back if condition changes.    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    Pt saw Merryl Hacker MD once and his note reports:   Chelsea Ball is a 62 y.o. woman who is referred for adrenal insufficiency.     eConsult by Dr. Smith Robert 11/04/20 reviewed     She has followed with [facility] Dr. [name] since 2020. She has a hx of irritable bowel syndrome, diverticulitis, cholecystectomy, and was referred for abdominal pain. 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 She subsequently developed significant fatigue, and had labs checked which showed:     12/08/20 (746am) - ACTH < 5, cortisol 0.5     After these results, she was started on hydrocortisone 10/5. She feels her energy level has slightly improved, but is still continuing to feel overall fatigued and  having difficulty sustaining energy throughout the day. She takes 10mg  around 10am, and then 5mg  around 1pm. We discussed more optimal timing of hydrocortisone (e.g. 8-9am and 3-4pm), as well as stress dosing and obtaining a medical alert bracelet (provided order form for the bracelet). Also discussed the diagnosis of adrenal insufficiency and importance of consistently taking hydrocortisone.      She feels her heartburn has worsened recently and she is out of Nexium. Is having difficulty figuring out the right dosing of laxative to balance her constipation/diarrhea. No current abd pain, n/v, or lightheadedness. She is going on a cruise starting at the end of January 2023.     No headaches, no hx of immunotherapy/checkpoint-inhibitor treatments.    05/2021: MRI pituitary normal  Since that visit in 03/2021 pt next saw E Anderson NP 09/2021 on [facility] 15/5. Intermittently on opiates. Had been on lower dose  (GI upset, thin stools) improved on increase from 10/5 to 15/5. Actually takes it 15 mg PO Q AM. She has been told she has UC. Does not recall sick day management or what to do if vomiting.    Other ongoing medical issues from the patient's PMH include   Past Medical History:   Diagnosis Date    Abdominal pain 01/21/2019    Asthma     Bipolar 1 disorder (CMS code) 01/21/2019    Depression 01/21/2019    GERD (gastroesophageal reflux disease) 05/04/2016    History of motion sickness     PONV (postoperative  nausea and vomiting)        Current medications   Current Outpatient Medications   Medication Sig Dispense Refill    albuterol 90 mcg/actuation metered dose inhaler Inhale into the lungs every 6 (six) hours as needed for Wheezing      beclomethasone (QVAR REDIHALER) 80 mcg/actuation breath activated inhaler       bismuth subsalicylate 262 mg TAB Take 1 tablet by mouth 3 (three) times daily as needed (for foul smelling stool) 60 tablet 0    calcium carbonate-vitamin D 600 mg(1,500mg ) -400 unit tablet       docusate sodium  (COLACE) 100 mg capsule Take 1 capsule (100 mg total) by mouth daily      esomeprazole (NEXIUM) 40 mg capsule Take 1 capsule (40 mg total) by mouth every morning before breakfast 90 capsule 3    HYDROcodone-acetaminophen (NORCO) 5-325 mg tablet Take 1 tablet by mouth      hydrocortisone (CORTEF) 5 mg tablet Take 2 tabs by mouth in the morning and 1 tab by mouth in the afternoon between 2pm-3pm 90 tablet 11    loratadine (CLARITIN) 10 mg tablet Take 1 tablet (10 mg total) by mouth daily      mesalamine (LIALDA) 1.2 gram EC tablet TAKE 1 TABLET BY MOUTH EVERY DAY 30 tablet 1    ondansetron (ZOFRAN) 8 mg tablet To be taken as needed for nausea ( see colonoscopy prep instructions for details) 2 tablet 0    oxybutynin (DITROPAN XL) 10 mg 24 hr tablet Take 1 tablet (10 mg total) by mouth daily      pregabalin (LYRICA) 75 mg capsule Take 1 capsule (75 mg total) by mouth daily as needed      simethicone (MYLICON) 125 mg chewable tablet PLEASE PURCHASE OVER THE COUNTER. PLEASE TAKE 3 TABLETS (approx. 400 mg) THE NIGHT BEFORE THE PROCEDURE and 3 TABLETS (approx. 400 mg) THE MORNING OF WHILE DRINKING THE BOWEL PREP. 6 tablet 0    simethicone (MYLICON) 125 mg chewable tablet PLEASE PURCHASE OVER THE COUNTER. PLEASE TAKE 3 TABLETS (approx. 400 mg) THE NIGHT BEFORE THE PROCEDURE and 3 TABLETS (approx. 400 mg) THE MORNING OF WHILE DRINKING THE BOWEL PREP. 6 tablet 0     No current facility-administered medications for this visit.       There have been no significant changes to the patient's PMH, FH, or SH.                 Assessment & Recommendations:  Chelsea Ball returns for follow up of secondary adrenal insufficiency which developed after treatment of IBD w budesonide. She is now on 10/5 mg hydrocortisone BID.  - I am concerned based on symptoms she had while tapering hydrocortisone (GI upset, thin stools) which responded to an increase in HC dose that she may be treating active inflammatory bowel disease with hydrocortisone and  that this is not simply adrenal insufficiency which is ready to taper steroids. Therefore I told pt I would like Dr. [name] to weigh in before further tapering.  - pt instructed to call GI clinic today re: current LGI sxs (passing mucus, upset stomach)  - baseline DEXA ordered given age, chronic steroids and IBD which are risk factors   - Ca, D levels when she is next at [facility]  - Self-care for adrenal insufficiency discussed including doubling HC dose for sick days and going to nearest ED PRN vomiting. We will reinforce this at each visit.  - She will follow up  after DEXA and labs (she does not use MyChart so we will have ofc send her an appt)    The patient and/or family verbalized understanding of our recommendations regarding further evaluation and treatment. The importance of taking all medications as prescribed was emphasized, as was the need to attend all recommended future imaging studies, laboratory testing, referrals, and follow-up visits.    I performed this evaluation using real-time telehealth tools, including live video Zoom connection between my location and the patient's location.  Prior to initiating I obtained informed verbal consent to perform this evaluation using telehealth tools.       Michiel Sites MD, PhD  Professor of Medicine and Surgery  Division of Endocrinology and Metabolism  Endocrine Clinic phone: [phone]  fax [phone]  Hospital ID # [id number]

## 2022-09-19 NOTE — Telephone Encounter (Addendum)
09/19/22 3:58pm: Tried calling x2, no answer, left message    Now having mild odor of stool  Trying to get rid of it before goes on travel.   Will be standing/walking/laying down and has some leakage of mucus.   Mostly stools softer than pebbles, now today going a lot more often  My assessment is likely some spasm/ incomplete evacuation leading to leakage of mucus; has had some improvement with antibiotics in the past for this but complications from flagyl use so will prefer to avoid antibiotics (may be some component of SIBO); however also may be some component of SCAD leading to change in bowel habits so advised taking two pills of mesalamine for the next 7 days instead of 1 pill per day.

## 2022-10-19 NOTE — Telephone Encounter (Signed)
Medication: Mesalamine  Last prescribed: 04/04 by Edwin Cap  Last appointment:   Next appointment: Visit date not found    Last procedure: 05/17    Last Labs:    Lab Results   Component Value Date    Thyroid Stimulating Hormone 2.37 09/23/2021        Wt Readings from Last 3 Encounters:   08/25/22 75.3 kg (166 lb)   08/24/22 75.3 kg (166 lb)   07/22/22 74.4 kg (164 lb)     Temp Readings from Last 3 Encounters:   08/25/22 36 C (96.8 F) (Temporal)   07/05/22 36.2 C (97.2 F) (Temporal)   11/23/20 36.3 C (97.3 F) (Temporal)     BP Readings from Last 3 Encounters:   08/25/22 106/68   07/05/22 108/70   09/14/21 117/68        Pulse Readings from Last 3 Encounters:   08/25/22 80   07/05/22 90   09/14/21 78         If patient has not been seen in clinic for 12 months or more, I have routed encounter to  the administrative team to book a follow-up appointment.    Patient stated that is having foul odor coming from her rectum    Chelsea Ball  [facility]

## 2022-10-20 MED ORDER — MESALAMINE 1.2 GRAM TABLET,DELAYED RELEASE
1.2 gram | ORAL_TABLET | ORAL | 5 refills | Status: DC
Start: 2022-10-20 — End: 2023-01-16

## 2022-10-25 ENCOUNTER — Inpatient Hospital Stay: Admit: 2022-10-25 | Discharge: 2023-03-26 | Payer: Medicaid Other | Primary: Physician

## 2022-10-25 ENCOUNTER — Ambulatory Visit: Admit: 2022-10-25 | Payer: MEDICAID | Primary: Physician

## 2022-10-25 DIAGNOSIS — E2740 Unspecified adrenocortical insufficiency: Secondary | ICD-10-CM

## 2022-10-25 DIAGNOSIS — T380X5A Adverse effect of glucocorticoids and synthetic analogues, initial encounter: Secondary | ICD-10-CM

## 2022-10-25 LAB — DEXA BONE DENSITY SPINE/HIP
AP Spine L1-L4 BMD: 0.873
AP Spine L1-L4 T Score: -2.5
AP Spine L1-L4 Z Score: -0.9
Left Femur Neck BMD: 0.735
Left Femur Neck T Score: -1.5
Left Femur Neck Z Score: -0.4
Left Total Hip BMD: 0.816
Left Total Hip T Score: -1.4
Left Total Hip Z Score: -0.6

## 2022-10-25 LAB — CALCIUM, TOTAL, SERUM / PLASMA: Calcium, total, Serum / Plasma: 9.4 mg/dL (ref 8.4–10.5)

## 2022-10-25 LAB — ALBUMIN, SERUM / PLASMA: Albumin, Serum / Plasma: 3.6 g/dL (ref 3.4–4.8)

## 2022-10-25 LAB — VITAMIN D, 25-HYDROXY: Vitamin D, 25-Hydroxy: 30 ng/mL (ref 20–50)

## 2022-11-10 NOTE — Telephone Encounter (Signed)
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 Returned pt's call but no answer to call. Voice message was left for pt as following:    Mesalamine prescription was sent to pt on 10/20/2022 30 pills x 5 refills.   Pt should contact her pharmacy for refills.   Pt was advised to contact GI office or the nurse if she has any question.     mesalamine (LIALDA) 1.2 gram EC tablet 30 tablet 5 10/20/2022 --    Sig: TAKE 1 TABLET BY MOUTH EVERY DAY    Sent to pharmacy as: mesalamine 1.2 gram tablet,delayed release (LIALDA)    E-Prescribing Status: Receipt confirmed by pharmacy (10/20/2022  9:53 AM PDT)

## 2022-12-22 ENCOUNTER — Other Ambulatory Visit: Payer: Self-pay | Admitting: Ophthalmology

## 2022-12-22 DIAGNOSIS — H510 Palsy (spasm) of conjugate gaze: Secondary | ICD-10-CM

## 2022-12-22 MED ORDER — DOCUSATE SODIUM 100 MG CAPSULE
100 | ORAL_CAPSULE | Freq: Every day | ORAL | 3 refills | Status: AC
Start: 2022-12-22 — End: ?

## 2022-12-22 NOTE — Telephone Encounter (Signed)
Called pt to relay Dr. Miguel Rota advices.     Take two pills of mesalamine daily instead of one pill daily for a week. This will help control the inflammation without the risks of side effects of antibiotics.    Pt said she has been taking two pills of mesalamine daily instead of one pill daily for over a month. Her symptoms are not improved. She stated she strained every time she had BM even she has 4-5 times daily. She has to lean front and back for BM.    Pt still asked for stool softer and antibiotic.     GI staff will reach out to schedule a follow up.

## 2022-12-22 NOTE — Telephone Encounter (Signed)
Hi Lyn,    The patient has been experiencing inflammation in her colon and reports a fishy odor. She is requesting an antibiotic but has refused to take metronidazole due to a previous reaction. Please send the prescription to [facility].    Please advise      Thank you!  Erskine Squibb

## 2022-12-22 NOTE — Telephone Encounter (Signed)
Called pt to relay GI provider's advice.     Your abdominal pain has been due to diverticulosis with some constipation and some inflammation. I do not think it is an infection so I would not recommend antibiotics. I would recommend she take miralax daily also if she is not currently taking this.       Pt said she couldn't take miralax no matter whatever dose she would take, it would cause nonstop diarrhea. It would cause tremendously anal soreness.     Pt would like to have Docusate sodium 100mg  capsule prescription sent to her pharmacy.     Pt asked to send antibiotic because her stools smells every poorly. She has a family event to go the next week. She did not like this odor when she would be around by others.

## 2022-12-22 NOTE — Telephone Encounter (Signed)
Returned pt's call today. Pt requested antibiotic to be sent to her preferred pharmacy on file.     Pt stated she has lower abdominal pain that doesn't go away. She has loose diarrhea 4-5 times daily. Pt stated she did discuss with GI provider to give her antibiotic treatment in the past.     Message will be forwarded to GI provider for further advice.

## 2022-12-27 ENCOUNTER — Ambulatory Visit
Admission: RE | Admit: 2022-12-27 | Discharge: 2022-12-27 | Disposition: A | Discharge status: Home / Self Care | Payer: BC Managed Care – PPO | Source: Ambulatory Visit | Attending: Ophthalmology | Admitting: Ophthalmology

## 2022-12-27 DIAGNOSIS — H510 Palsy (spasm) of conjugate gaze: Secondary | ICD-10-CM

## 2022-12-27 MED ORDER — GADOBUTROL 1 MMOL/ML IV SOLN
7.5000 mL | Freq: Once | INTRAVENOUS | Status: AC | PRN
  Administered 2022-12-27: 7.5 mL via INTRAVENOUS

## 2022-12-28 ENCOUNTER — Other Ambulatory Visit: Payer: BC Managed Care – PPO

## 2023-01-02 MED ORDER — ESOMEPRAZOLE MAGNESIUM 40 MG CAPSULE,DELAYED RELEASE
40 mg | ORAL_CAPSULE | Freq: Every morning | ORAL | 3 refills | Status: DC
Start: 2023-01-02 — End: 2023-01-29

## 2023-01-16 ENCOUNTER — Ambulatory Visit: Admit: 2023-01-16 | Discharge: 2023-01-30 | Payer: MEDICAID | Attending: "Endocrinology | Primary: Physician

## 2023-01-16 ENCOUNTER — Ambulatory Visit: Admit: 2023-01-16 | Payer: MEDICAID | Primary: Physician

## 2023-01-16 DIAGNOSIS — E2740 Unspecified adrenocortical insufficiency: Secondary | ICD-10-CM

## 2023-01-16 LAB — ALBUMIN, SERUM / PLASMA: Albumin, Serum / Plasma: 3.6 g/dL (ref 3.4–4.8)

## 2023-01-16 LAB — CREATININE, SERUM / PLASMA
Creatinine: 0.6 mg/dL (ref 0.55–1.02)
eGFRcr: 102 mL/min/{1.73_m2} (ref >59)

## 2023-01-16 LAB — CALCIUM, TOTAL, SERUM / PLASMA: Calcium, total, Serum / Plasma: 9.4 mg/dL (ref 8.4–10.5)

## 2023-01-16 LAB — VITAMIN D, 25-HYDROXY: Vitamin D, 25-Hydroxy: 30 ng/mL (ref 20–50)

## 2023-01-16 MED ORDER — HYDROCORTISONE 5 MG TABLET
5 | ORAL_TABLET | ORAL | 1 refills | 7.00000 days | Status: DC
Start: 2023-01-16 — End: 2023-07-24

## 2023-01-16 MED ORDER — MESALAMINE 1.2 GRAM TABLET,DELAYED RELEASE
1.2 | ORAL_TABLET | ORAL | 5 refills | 60.00000 days | Status: DC
Start: 2023-01-16 — End: 2023-07-04

## 2023-01-16 NOTE — Addendum Note (Signed)
Addended by: Myrla Halsted on: 02/05/2023 10:42 AM     Modules accepted: Orders

## 2023-01-16 NOTE — Progress Notes (Signed)
ENDOCRINE CLINIC RETURN VISIT    Date of Service: 01/16/2023    Ms. Chelsea Ball is 62 years old and returns in follow up of secondary adrenal insufficiency and osteoporosis.    Pt saw Merryl Hacker MD once and his note reports:   Chelsea Ball is a 62 y.o. woman who is referred for adrenal insufficiency.     eConsult by Dr. Smith Robert 11/04/20 reviewed     She has followed with [facility] Dr. [name] since 2020. She has a hx of irritable bowel syndrome, diverticulitis, cholecystectomy, and was referred for abdominal pain. 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 She subsequently developed significant fatigue, and had labs checked which showed:     12/08/20 (746am) - ACTH < 5, cortisol 0.5     After these results, she was started on hydrocortisone 10/5. She feels her energy level has slightly improved, but is still continuing to feel overall fatigued and having difficulty sustaining energy throughout the day. She takes 10mg  around 10am, and then 5mg  around 1pm. We discussed more optimal timing of hydrocortisone (e.g. 8-9am and 3-4pm), as well as stress dosing and obtaining a medical alert bracelet (provided order form for the bracelet). Also discussed the diagnosis of adrenal insufficiency and importance of consistently taking hydrocortisone.      She feels her heartburn has worsened recently and she is out of Nexium. Is having difficulty figuring out the right dosing of laxative to balance her constipation/diarrhea. No current abd pain, n/v, or lightheadedness. She is going on a cruise starting at the end of January 2023.     No headaches, no hx of immunotherapy/checkpoint-inhibitor treatments.     05/2021: MRI pituitary normal  Since that visit in 03/2021 pt next saw E Anderson NP 09/2021 on [facility] 15/5.  Intermittently on opiates. Had been on lower dose  (GI upset, thin stools) improved on increase from 10/5 to 15/5. Actually takes it 15 mg PO Q AM. She has been told she has UC. Does not recall sick day management or what to do if vomiting.    DEXA: (Date: 10/2022, Facility: [facility], Manufacturer: H)  Region T-score Z-score % Change (*)   Lumbar Spine -2.5 -0.9 Baseline   Femoral Neck -1.5     Total Hip -1.4       I saw pt for first time 4 mos ago at which time I was concerned that tapering steroids was causing GI sxs to flare. I asked for pt to call GI clinic that day Dr. [name] from GI to weigh in. Plan also was for DEXA (done, above) and Ca, D. In June Dr. [name] recommended increase in mesalamine briefly. Last month Dr. [name] recommended an increase in docusate for diverticulosis. Has run out of mesalamine (dose was doubled earlier in the summer). Has new GI sched for next year in January. Has upper back pain. Is edentulous. Waiting for referral for implants. Taking Norco BID  10/2022: 25-D 30, Ca 9.4    Other ongoing medical issues from the patient's PMH include   Past Medical History:   Diagnosis Date    Abdominal pain 01/21/2019    Asthma     Bipolar 1 disorder (CMS code) 01/21/2019    Depression 01/21/2019    GERD (gastroesophageal reflux disease) 05/04/2016    History of  motion sickness     PONV (postoperative nausea and vomiting)        Current medications   Current Outpatient Medications   Medication Sig Dispense Refill    beclomethasone (QVAR REDIHALER) 80 mcg/actuation breath activated inhaler       calcium carbonate-vitamin D 600 mg(1,500mg ) -400 unit tablet       docusate sodium (COLACE) 100 mg capsule Take 1 capsule (100 mg total) by mouth daily 90 capsule 3    HYDROcodone-acetaminophen (NORCO) 5-325 mg tablet Take 1 tablet by mouth      loratadine (CLARITIN) 10 mg tablet Take 1 tablet (10 mg total) by mouth daily      oxybutynin (DITROPAN XL) 10 mg 24 hr tablet Take 1 tablet (10 mg total) by  mouth daily      pregabalin (LYRICA) 75 mg capsule Take 1 capsule (75 mg total) by mouth daily as needed      hydrocortisone (CORTEF) 5 mg tablet Three PO Q AM and one PO Q PM 360 tablet 1    mesalamine (LIALDA) 1.2 gram EC tablet 2 tabs po daily 60 tablet 5     No current facility-administered medications for this visit.       There have been no significant changes to the patient's PMH or FH.    SOC:   Social History     Socioeconomic History    Marital status: Single     Spouse name: Not on file    Number of children: Not on file    Years of education: Not on file    Highest education level: Not on file   Occupational History    Not on file   Tobacco Use    Smoking status: Every Day     Current packs/day: 1.00     Average packs/day: 1 pack/day for 10.0 years (10.0 ttl pk-yrs)     Types: Cigarettes    Smokeless tobacco: Never    Tobacco comments:     3 cigarettes/day   Substance and Sexual Activity    Alcohol use: Yes     Alcohol/week: 4.0 - 5.0 standard drinks of alcohol     Types: 2 - 3 Glasses of wine, 2 Cans of beer per week    Drug use: Not Currently     Comment: cocaine 7 years ago    Sexual activity: Not on file   Other Topics Concern    Not on file   Social History Narrative    Not on file     Social Determinants of Health     Financial Resource Strain: Not on File (11/27/2017)    Received from [employer], [employer]     Financial Resource Strain: 0   Food Insecurity: Not on File (01/04/2023)    Received from [employer] Insecurity     Food: 0   Transportation Needs: Not on File (11/27/2017)    Received from [employer], [employer] Needs     Transportation: 0   Housing Stability: Not on File (11/27/2017)    Received from [employer], [employer] Stability     Housing: 0       Physical Exam:   GENERAL: The patient looks well.   VITAL SIGNS: Blood pressure 105/71, pulse 86, weight 73.9 kg (163 lb). Body mass index is 30.8 kg/m.  SPINE: No kyphosis or spinal tenderness.  PSYCH: Normal  mood and affect.  Records Reviewed Today:  - labs: above  - imaging: above    Assessment & Recommendations:  Chelsea Ball returns for follow up of adrenal insufficiency and osteoporosis. Interval history as above.  Our plan:  - HC 15/5  - pre-Reclast labs  - Reclast dose ordered. Benefits explained. Side effects discussed today including ONJ, atypical femur fracture, acute phase reaction.  - I made pt aware today re dental implants being problematic after prior Reclast treatment. Unclear now if/when they will happen so she would like to proceed.  Follow up 3 mos scheduled.    The patient and/or family verbalized understanding of our recommendations regarding further evaluation and treatment. The importance of taking all medications as prescribed was emphasized, as was the need to attend all recommended future imaging studies, laboratory testing, referrals, and follow-up visits.      Michiel Sites MD, PhD  Professor of Medicine and Surgery  Division of Endocrinology and Metabolism  Endocrine Clinic phone: [phone]  fax [phone]  Hospital ID # [id number]

## 2023-01-17 NOTE — Telephone Encounter (Signed)
Called pt to triage. Pt felt "miserable"   That may be due to osteoporosis, which give her lots of pain.     Her GI symptoms were manageable.   She will pick up mesalamine and will start taking mesalamine today, which she was running out of medication.    Abdominal pain always ache but manageable.     She had loose BM x 2 yesterday and today and no blood noted.    She preferred to deal with her osteoporosis first.   Pt was advised to keep [place] updated.    She verbalized understanding and appreciation.

## 2023-01-23 ENCOUNTER — Inpatient Hospital Stay: Admit: 2023-01-23 | Payer: MEDICAID | Primary: Physician

## 2023-01-23 DIAGNOSIS — E2740 Unspecified adrenocortical insufficiency: Secondary | ICD-10-CM

## 2023-02-27 ENCOUNTER — Ambulatory Visit: Admit: 2023-02-27 | Payer: MEDICAID | Primary: Physician

## 2023-02-27 DIAGNOSIS — M810 Age-related osteoporosis without current pathological fracture: Secondary | ICD-10-CM

## 2023-02-27 MED ORDER — ZOLEDRONIC ACID 5 MG/100 ML IN MANNITOL 5 %-WATER INTRAVENOUS PIGGYBCK
5 | Freq: Once | INTRAVENOUS | Status: AC
Start: 2023-02-27 — End: 2023-02-27
  Administered 2023-02-27: 18:00:00 5 mg via INTRAVENOUS

## 2023-02-27 MED FILL — ZOLEDRONIC ACID 5 MG/100 ML IN MANNITOL 5 %-WATER INTRAVENOUS PIGGYBCK: 5 5 mg/100 mL | INTRAVENOUS | Qty: 100

## 2023-02-27 NOTE — Nursing Note (Signed)
Aliah is here for #1 reclast (per orders 1 dose q1 year). She endorses hip and back pain r/t her osteoporosis. Pain managed with home meds.   Labs reviewed and within parameters for treatment. Educational material provided and reviewed and all questions answered.   PIV placed with brisk blood return. Pt completed treatment without incident. PIV flushed and removed and pt discharged home stable and ambulatory.       .COMPLETE THE DAY NON-ONC:   The following check list items below have been reviewed and implemented:   1. Reviewed Therapy Plan and Signed and Held Orders Yes  2. Order Review: Review for any required lab orders  3. Medication start and end times completed Yes  4. Follow-up appointment scheduled (Date/Time): pt to schedule if needed per endocrinologist Leonor Liv, Bea Laura

## 2023-03-05 NOTE — Telephone Encounter (Addendum)
Hello Dr.Holt,     Patient called requesting a phone appointment with you regarding high levels of pain after her last infusion.    She states that the information about her infusion was different than the one provided by you on her last appointment.    At the infusion center she was informed she needed one 15 mins session once a year, and she had the understanding that she was supposed to have a 30 mins session every 3-6 months.    I instructed her to look for UC regarding her pain and that I would advise the MD.    Best regards,  Wendie Chess        -----------------------------------------------------------------------------------------------------------    I called patient back to assist with questions.   She received IV Zol last week on 02/27/2023. The following day experienced fever, chills and body aches. Did not take Tylenol. She has "total body pains" at baseline and taked Norco daily PRN, most days. Denies any chest pain, no shortness of breath, no abdominal pain, no N/V. She does endorse decreased appetite.   I recommended to her to hydrate well now, take Tylenol and ensure she is taking HC as prescribed by Dr. Leonor Liv 15/5. If feeling unwell okay to double HC dose, she does not feel she needs to do that now. She ran out of her Norco and just had it refilled today, so will take one Norco and hydrate. I asked her to let me know if any symptoms worsen, but at this point she feels symptoms improving. I reviewed ER precautions, she states understanding.     Blair Hailey. Dareen Piano, NP  [facility]   03/05/2023

## 2023-04-17 ENCOUNTER — Telehealth: Admit: 2023-05-01 | Discharge: 2023-05-01 | Payer: MEDICAID | Attending: "Endocrinology | Primary: Physician

## 2023-04-17 DIAGNOSIS — M810 Age-related osteoporosis without current pathological fracture: Secondary | ICD-10-CM

## 2023-04-17 NOTE — Progress Notes (Signed)
THIS RETURN VISIT WAS CONDUCTED VIA TELEPHONE    Date of Service: 04/17/2023  Provider: Michiel Sites, MD PhD  Telephone Visit: For this visit the clinician and patient were present via telephone (audio only).  Patient counseled on options for this visit type and opted for audio telephone visit.  Patient verbally consented to a Telephone Visit: YES    TELEPHONE TELEHEALTH VISIT     TELEPHONE VISIT: For this visit the clinician and patient were present via telephone (audio only).  Patient counseled on available options for visit type; Patient elected telephone visit;   Patient consent given for telephone visit: YES  Patient Identity was confirmed during this call.      Other individuals actively participating in the telephone encounter and their name/relation to the patient: none  State patient was in at the time of the call: [place]  Total time spent in medical telephone consultation: 9 min    Because this visit was completed over telephone, a hands-on physical exam was not performed.  Patient understands and knows to call back if condition changes.     HPI and visit narrative:  Chelsea Ball is 63 years old and returns in follow up of secondary adrenal insufficiency and osteoporosis.    Pt saw Merryl Hacker MD once and his note reports:   Chelsea Ball is a 63 y.o. woman who is referred for adrenal insufficiency.     eConsult by Dr. Smith Robert 11/04/20 reviewed     She has followed with [facility] Dr. [name] since 2020. She has a hx of irritable bowel syndrome, diverticulitis, cholecystectomy, and was referred for abdominal pain. 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 She subsequently developed significant fatigue, and had labs checked which showed:     12/08/20 (746am) - ACTH < 5, cortisol 0.5      After these results, she was started on hydrocortisone 10/5. She feels her energy level has slightly improved, but is still continuing to feel overall fatigued and having difficulty sustaining energy throughout the day. She takes 10mg  around 10am, and then 5mg  around 1pm. We discussed more optimal timing of hydrocortisone (e.g. 8-9am and 3-4pm), as well as stress dosing and obtaining a medical alert bracelet (provided order form for the bracelet). Also discussed the diagnosis of adrenal insufficiency and importance of consistently taking hydrocortisone.      She feels her heartburn has worsened recently and she is out of Nexium. Is having difficulty figuring out the right dosing of laxative to balance her constipation/diarrhea. No current abd pain, n/v, or lightheadedness. She is going on a cruise starting at the end of January 2023.     No headaches, no hx of immunotherapy/checkpoint-inhibitor treatments.     05/2021: MRI pituitary normal  Since that visit in 03/2021 pt next saw E Anderson NP 09/2021 on [facility] 15/5. Intermittently on opiates. Had been on lower dose  (GI upset, thin stools) improved on increase from 10/5 to 15/5. Actually takes it 15 mg PO Q AM. She has been told she has UC. Does not recall sick day management or what to do if vomiting.     DEXA: (Date: 10/2022, Facility: [facility], Manufacturer: H)  Region T-score Z-score % Change (*)   Lumbar Spine -2.5 -0.9 Baseline   Femoral Neck -1.5  Total Hip -1.4          I saw pt for first time 4 mos ago at which time I was concerned that tapering steroids was causing GI sxs to flare. I asked for pt to call GI clinic that day Dr. [name] from GI to weigh in. Plan also was for DEXA (done, above) and Ca, D. In June Dr. [name] recommended increase in mesalamine briefly. Last month Dr. [name] recommended an increase in docusate for diverticulosis. Has run out of mesalamine (dose was doubled earlier in the summer). Has new GI sched for next year in January.  Has upper back pain. Is edentulous. Waiting for referral for implants. Taking Norco BID  10/2022: 25-D 30, Ca 9.4    Still having GI sxs and aches/pains. Has GI appt next week. Does not have coverage for implants so we elected to move ahead w ZOL which was given 02/2023 without issue.    Other ongoing medical issues from the patient's PMH include   Past Medical History:   Diagnosis Date    Abdominal pain 01/21/2019    Asthma     Bipolar 1 disorder (CMS code) 01/21/2019    Depression 01/21/2019    GERD (gastroesophageal reflux disease) 05/04/2016    History of motion sickness     PONV (postoperative nausea and vomiting)        Current medications   Current Outpatient Medications   Medication Sig Dispense Refill    beclomethasone (QVAR REDIHALER) 80 mcg/actuation breath activated inhaler       calcium carbonate-vitamin D 600 mg(1,500mg ) -400 unit tablet       docusate sodium (COLACE) 100 mg capsule Take 1 capsule (100 mg total) by mouth daily 90 capsule 3    HYDROcodone-acetaminophen (NORCO) 5-325 mg tablet Take 1 tablet by mouth      hydrocortisone (CORTEF) 5 mg tablet Three PO Q AM and one PO Q PM 360 tablet 1    loratadine (CLARITIN) 10 mg tablet Take 1 tablet (10 mg total) by mouth daily      mesalamine (LIALDA) 1.2 gram EC tablet 2 tabs po daily 60 tablet 5    oxybutynin (DITROPAN XL) 10 mg 24 hr tablet Take 1 tablet (10 mg total) by mouth daily      pregabalin (LYRICA) 75 mg capsule Take 1 capsule (75 mg total) by mouth daily as needed       No current facility-administered medications for this visit.       There have been no significant changes to the patient's PMH or FH.    SOC:   Social History     Socioeconomic History    Marital status: Single     Spouse name: Not on file    Number of children: Not on file    Years of education: Not on file    Highest education level: Not on file   Occupational History    Not on file   Tobacco Use    Smoking status: Every Day     Current packs/day: 1.00     Average  packs/day: 1 pack/day for 10.0 years (10.0 ttl pk-yrs)     Types: Cigarettes    Smokeless tobacco: Never    Tobacco comments:     3 cigarettes/day   Substance and Sexual Activity    Alcohol use: Yes     Alcohol/week: 4.0 - 5.0 standard drinks of alcohol     Types: 2 - 3 Glasses of wine, 2 Cans of  beer per week    Drug use: Not Currently     Comment: cocaine 7 years ago    Sexual activity: Not on file   Other Topics Concern    Not on file   Social History Narrative    Not on file     Social Drivers of Health     Financial Resource Strain: Not on File (11/27/2017)    Received from [employer], [employer]     Financial Resource Strain: 0   Food Insecurity: Not on File (01/04/2023)    Received from [employer]     Food: 0   Transportation Needs: Not on File (11/27/2017)    Received from [employer], [employer] Needs     Transportation: 0   Housing Stability: Not on File (11/27/2017)    Received from [facility], [employer] Stability     Housing: 0       Assessment & Recommendations:  Today's visit was conducted by telephone.  - pt to see Dr. [name] next week. Await his input re: need to continue steroids. If they are not needed we will taper further. She is still having GI sxs so unclear to me if it is appropriate to taper or perhaps there is a steroid-sparing agent she can take.  - follow up w me 2-3 mos to discuss steroid taper if possible. Office to call pt w appt.  - next Reclast due 02/2024    The patient and/or family verbalized understanding of our recommendations regarding further evaluation and treatment. The importance of taking all medications as prescribed was emphasized, as was the need to attend all recommended future imaging studies, laboratory testing, referrals, and follow-up visits.

## 2023-04-26 ENCOUNTER — Ambulatory Visit
Admit: 2023-04-26 | Discharge: 2023-04-26 | Payer: MEDICAID | Attending: Student in an Organized Health Care Education/Training Program | Primary: Physician

## 2023-04-26 DIAGNOSIS — K5010 Crohn's disease of large intestine without complications: Secondary | ICD-10-CM

## 2023-04-26 DIAGNOSIS — K5730 Diverticulosis of large intestine without perforation or abscess without bleeding: Secondary | ICD-10-CM

## 2023-04-26 MED ORDER — PSYLLIUM ORAL POWDER
Freq: Every day | ORAL | 11 refills | 30.00000 days | Status: DC
Start: 2023-04-26 — End: 2023-07-24

## 2023-04-26 MED ORDER — LUBIPROSTONE 24 MCG CAPSULE
24 | ORAL_CAPSULE | Freq: Two times a day (BID) | ORAL | 0 refills | 60.00000 days | Status: DC
Start: 2023-04-26 — End: 2023-08-13

## 2023-04-26 NOTE — Patient Instructions (Signed)
Schedule ct abdomen and pelvis (call radiology this afternoon)  Start amitiza and psyllium (pick up from pharmacy)  Start taking omeprazole daily  Continue mesalamine

## 2023-04-26 NOTE — Progress Notes (Signed)
Subjective    Chelsea Ball is a 63 y.o. female who presents with the following:    Chief Complaint              Segmental colitis associated with diverticulosis (CMS code)                History of Present Illness     04/26/23 Chelsea Ball):  This is initial visit with me, previously followed with Dr. Nathanial Ball. In interim, continues to have abdominal pain and constipation. Notes having 1-2 days per where she feels well without pain or constipation but the other days she has bilateral lower abdominal pain going to her back worsened with constipation. She notes she has to rock on the commode or distort her self to pass a bowel movement. Takes a stool softener but no other laxatives. Currently taking mesalamine 2.4g/d per day which was changed from 1.2g/d about 1 year ago. Denies having blood in the stool. Lastly has acid reflux at times but is not taking omeprazole regularly. No other acute complaints.    Previous Clinic Visits  07/05/22 Chelsea Ball):  "This is a 63 y.o. female with history of irritable bowel syndrome with constipation, diverticulitis, cholecystectomy (2019), depression, substance use and hysterectomy who is referred to gastroenterology for diverticulosis with abdominal pain and incomplete colonoscopy, following up after colonoscopy was able to be completed.      Background history:  [place] Caprice [place] Consult question: "Patient with diverticulosis / diverticulitis (treated).[name] disorted lumen in splenic flexure and descending colon- Please evaluate for possible surgery?"     The patient describes her problem as having problems having a bowel movement. When she eats she has to eat some fruit to have a bowel movement and it can be runny. The patient reports weight loss because she gets severe stomach pain when she eats. The pain is happening on a daily basis. The entire stomach is painful, not one particular area- she feels she has to pant (breathe heavily) which can calm the pain a  little bit. She reports that she has been put on bed rest by Dr. Vonita Ball because of this. She has had weight loss related to eating less, she was 142 pounds, now down to 115 pounds. The only thing she can eat is soft foods, and reports she's not able to eat much. If she eats more she gets severe pain. She has some nausea but no vomiting. She reports being very weak now. She is also having rotator cuff problem. She reports having bowel movements that are runny - they used to be small, solid, curved, but now have transitioned to being runny. Some days she does not have a bowel movement, and some days she will have a few runny bowel movements. She has some fear of eating. There is no blood in the stools.      She has not seen a nutritionist, does not take any nutrition shakes. She has stopped taking any of her medications because of the abdominal pain, uses Norco maybe once a week for rotator cuff, occasionally takes Calcium.     The patient reads me her medication list, although she is not curently taking these: Mirtazapine 15, megestrol 15, 25mg  hydroxyzine, 6mg  tizanadine PRN, calcium +vitamin D, docusate, fluoxetine, lamotrigine, albuterol, QVAR, Norco 5. She takes esomeprazole 40mg  as needed for heartburn. She has tried FiberChoice tablets, didn't help her. Prior medicines from Dr. Vonita Ball gave her diarrhea. She tried miralax. She takes lactulose sometimes when constipated,  helps her go. No enemas. Her prior medication list from June 2020 note of Dr. Vonita Ball also mentions several PPI's, as well as metamucil, Creon, Amitiza, Bentyl, despiramine, Donnatal, fleet's enemas, but she reports prior medicines prescribed by Dr. Vonita Ball just gave her diarrhea so she's not on them now.      She was seen in June 2020 by her gastroenterologist Dr. Vonita Ball who noted that he had attempted colonoscopy but was unable to advance past an area.      Her cholecystectomy did not help her pain in 2019.      Note, utox has been  positive for cocaine metabolites on 05/29/18, 12/25/16, ad 01/25/16.      02/28/19 phone visit:  At my initial clinic visit on 01/21/19, I planned to repeat colonoscopy with anesthesia. 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 I started the patient on mesalamine 2.4g daily which she did not tolerate after it causing sour stomach for a week.      At first she was having small bowel movements with postprandial abdominal pain. She is still having thin stools and postprandial discomfort that begins even right when she begins to eat a meal that has caused some fear of eating larger meals. At our 02/28/19 visit she is a little better but not much. Now she reports walking more after being off bed rest and is having still thin bowel movements. She reports still not eating very well but thinks she has not lost further weight. She has not filled other medications with her PCP at this point. She is back on her psych meds. She takes opioids but only rarely, not every day.      05/21/19 visit:  At our 04/29/18 video visit, she was given a course of rifaximin with some improvement initially but symptoms returned. She was prescribed a course of Uceris but was not able to get this due to insurance for a couple of months.      At our 05/21/19 visit, she reports symptoms are getting worse. She has frequent small volume diarrhea, sometimes with urges to have a bowel movement but nothing comes out. When she does have bowel movments, it tends to be small bits or runny. She has had some episodes of incontinence. Some days she goes to the bathroom every 20 or 30 minutes and bowel movements are still very thin- bowel movements are like very long worms if it's not watery. Her abdominal pain is severe- reports it is worse than labor pains, intermittent spasms affecting the whole abdomen - she has  to lay still and focus on her breathing to get the pain improve. It involves the whole abdomen and tends to happen when she is on the toilet trying to have a bowel movement. She continues to be afraid to eat because when she eats she usually has to go to the bathroom within 10-15 minutes for a bowel movement and then she gest the pain while on the toilet. She changes her body position (leaning backwards) sometimes helps a little stool come out. She continues to lose weight, she thinks now down to maybe 100 pounds although does not have a scale to confirm this.      Interval Events- 08/29/19 visit:   At our 05/21/19 visit I had referred the patient to colorectal surgery given the ongoing weight loss and severity of her symptoms.  She then had the Uceris approved and started it June 20, 2019 and did experience some improvement in symptoms so surgery was not pursued at the time. She reported constipation and was prescribed Miralax in April 2021.      She is continuing to use Miralax with sometimes loose stools. Appetite is increased on the steroids. She feels a lot better on the Uceris, not miserable and waking up overnight anymore. She still has some episodes of wanting to poop and not being able to poop. She's eating moderately. She has 3 more weeks of Uceris left right now. Her weight has gone up, 113# up to 130.      Interval Events: 01/26/20  She had an ED visit 11/05/19 for abdominal pain. Mobic was refilled and gabapentin prescribed.      Lower abd pain, sometimes wrapping around to the back. Pelvic ultrasound was normal. Has been having problems with bowel movements. Has not received budesonide this week. Entire lower abdomen. miralax works only fair. Budesonide was increasing appetite, but eating makes pain worse. Throughout day there is a periodic ache. bm's 1-2 times per day, but has to lean back and forth to have bm's, normally 3-4 small piees in the toilet. Symptoms tend to be relieved with bowel movements.  Weight is around 142.      Interval events: 01/29/20:  01/29/20 plan was to take Uceris every other day for 3 months to see if it can be weaned, prescribed ongoing miralax and a bowel preparation in case needed. Referred for anorectal manometry/biofeedback but insurance reportedly does not cover biofeedback. Taking miralax every other day because daily use gives diarrhea. PCP tapering pain medications. Entocort 3mg  tabs prescribed     She's having problems with the budesonide. She is back to feeling how she was before starting steroids. Bowel habits have been loose and runny, back and forth to the bathroom all day, with stomach pains. Appetite has been low from stomach hurting, very bad, weight is now going down again. This happened when budesonide changed from 9 to 3. Currently not taking miralax. No vomiting. Weight is now 134.      Interval Events 05/31/20:   Pain comes and goes, pants/breathes wih it. Happening 4-5 times per day. Pain is in the lower abdomen. Appetite increased a bit, weight coming back. Weight 141 now. Taking probiotic. Stools are smaller with some mucus. Stools are soft solids. Budesonide helps with the appetite and keeping weight up but not with the abdominal pains.      Interval events 07/01/20  Thinks mesalamine may be working. Weight is steady, 140. However still having lots of mucus frequently during the day and with bowel movements. Feels like it helps mucus come out. Bowel movements now formed and length of her finger or slightly longer. Takes 2 budesonides 3mg  with mesalamine. She has questions about if ERCP is needed. Feels a problem with swallowing in the neck and throat, not in the chest.      07/19/20 phone call w blood in stool  08/16/20 course of metronidazole for suspected SIBO  09/2020 colonoscopy: advised stop budesonide, +polyps, next colonoscopy recommended in 3 years.   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; 3 day course of flagyl; still on mesalamine, stopped probiotic bc of expense, advised try peppermint oil  12/29/21 patienet called notifying that can't take flagyl anymore  06/14/22 call in with abdominal pain"      Allergies/Contraindications   Allergen Reactions    Metronidazole Other (See Comments)     Flares up yeast     Outpatient Encounter Medications as of 04/26/2023   Medication Sig Dispense Refill    beclomethasone (QVAR REDIHALER) 80 mcg/actuation breath activated inhaler       calcium carbonate-vitamin D 600 mg(1,500mg ) -400 unit tablet       docusate sodium (COLACE) 100 mg capsule Take 1 capsule (100 mg total) by mouth daily 90 capsule 3    HYDROcodone-acetaminophen (NORCO) 5-325 mg tablet Take 1 tablet by mouth      hydrocortisone (CORTEF) 5 mg tablet Three PO Q AM and one PO Q PM 360 tablet 1    loratadine (CLARITIN) 10 mg tablet Take 1 tablet (10 mg total) by mouth daily      mesalamine (LIALDA) 1.2 gram EC tablet 2 tabs po daily 60 tablet 5    oxybutynin (DITROPAN XL) 10 mg 24 hr tablet Take 1 tablet (10 mg total) by mouth daily      pregabalin (LYRICA) 75 mg capsule Take 1 capsule (75 mg total) by mouth daily as needed       No facility-administered encounter medications on file as of 04/26/2023.     Past Medical History:   Diagnosis Date    Abdominal pain 01/21/2019    Asthma     Bipolar 1 disorder (CMS code) 01/21/2019    Depression 01/21/2019    GERD (gastroesophageal reflux disease) 05/04/2016    History of motion sickness     PONV (postoperative nausea and vomiting)        Past Surgical History:   Procedure Laterality Date    CHOLECYSTECTOMY  04/26/2017    HYSTERECTOMY      right open carpal tunnel release (Right  Wrist)  05/15/2018     Family History   Problem Relation Name Age of Onset    Malig hyperten Neg Hx      Malig hypertherm Neg Hx      Anesth problems Neg Hx      Bleeding disorder Neg Hx         Social History     Tobacco Use    Smoking status: Every Day     Current packs/day: 1.00     Average packs/day: 1 pack/day for 10.0 years (10.0 ttl pk-yrs)     Types: Cigarettes    Smokeless tobacco: Never    Tobacco comments:     3 cigarettes/day   Substance and Sexual Activity    Alcohol use: Yes     Alcohol/week: 4.0 - 5.0 standard drinks of alcohol     Types: 2 - 3 Glasses of wine, 2 Cans of beer per week    Drug use: Not Currently     Comment: cocaine 7 years ago           Objective      Vitals      Flowsheet Row Most Recent Value   Weight 73.3 kg (161 lb 9.6 oz)   Height 154.4 cm (5' 0.79")   Pain Score 6   Pain Loc ABDOMEN   BMI (Calculated) 30.8              Physical Exam  General:  Well nourished, well developed, NCAT, no acute distress, non-toxic  Affect:  Normal  HEENT:  No scleral icterus, no conjunctival pallor, mucus membranes moist  Lungs: breathing comfortably on room air  Cardiovascular:  No pallor  Abdomen:  Non-distended  Extremities:  No peripheral edema  Skin:  Non-icteric  Neuro:  Alert and oriented x 3    Review of Prior Testing    08/2022 Colonoscopy:  Impression:         - Severe diverticulosis in the sigmoid colon but no visible inflammation.          - The perianal and digital rectal examinations were normal.          -  Two 1 to 2 mm polyps in the ascending colon, removed with a cold biopsy             forceps.  Resected and retrieved.          -  Two 2 to 3 mm polyps in the transverse colon, removed with a cold biopsy             forceps.  Resected and retrieved.          -  The distal rectum and anal verge are normal on retroflexion view.   Recommendation:         -  Await pathology results.          -  Repeat colonoscopy for surveillance based on pathology results.     FINAL PATHOLOGIC  DIAGNOSIS     A. Ascending colon, polyps, biopsy:  Tubular adenoma.     B. Transverse colon, polyps, biopsy:  Tubular adenoma.    11/2020 EGD:  IMPRESSIONS:     1.  The entire examined duodenum appeared normal   2.  The mucosa of the stomach appeared normal   3.  The z-line appeared normal was located 36cm from the incisors   4.  The mucosa of the esophagus appeared normal   5.  Biopsies taken of the mid esophagus to rule out eosinphilic   esophagitis     RECOMMENDATIONS:    Await pathology results   REPEAT EXAM:     EGD as needed for clinical indication.     09/2020 colonoscopy:  IMPRESSIONS:     1.  11 mm sessile polyp was found in the ascending   colon; endoscopic mucosal resection was performed   2.  5 mm sessile polyp was found in the ascending colon; polypectomy   was performed   3.  Four 2 to 4mm mm in size polyps were found in the sigmoid colon;   polypectomy was performed with a cold snare   4.  Three 2 mm polyps were found in the rectum; polypectomy was   performed with a cold snare   5.  Severe non-bleeding diverticulosis was noted in the sigmoid colon   6.  Retroflexed views revealed internal hemorrhoids     RECOMMENDATIONS:     1.  I suspect the recent bleeding was from   internal hemorrhoids.  The diverticulosis did not have a lot of   inflammation today visually.  I recommend slowly going off of the   budesonide medicine since there is not much visible inflammation but   continuing the mesalamine.   2.  Avoid NSAIDS (over the counter pain killers, Aspirin, Ibuprofen,   Aleve, Motrin etc.) as well as blood thinners (Heparin, Coumadin   etc.) 7 day(s)   REPEAT COLONOSCOPY:  Polyps were found, therefore your next   colonoscopy will be determined based on your pathology results, and a   MyChart message will be sent to you with results and recommendations.      Assessment and Plan         This is a 4F with history of IBS, diverticulitis, SCAD, CCY (2019), depression substance abuse, hysterectomy who  presents for follow up    # IBS-C  # SCAD  # GERD  # Colon polyps    Patient with abdominal pain with constipation, which improves with bowel movements. Unfortunately not taking any laxatives other than stool softeners currently. She tried Miralax in the past but states it caused diarrhea. Recommend starting psyllium supplementation and Amitiza for global IBS-C management. Based on response can see if in the future could tolerate Linzess. Her SCAD seems under control with mesalamine as her pain above seems more IBS in nature. We will continue with mesalamine and obtain a new CTAP for her abdominal pain. Lastly, discussed starting omeprazole daily for GERD and if not improving in the future then can consider an upper endoscopy.    RECS:  - Continue mesalamine 2.4g/d  - CTAP  - Start amitiza 24 mcg PO BID  - Start psyllium supplementation  - Start omeprazole daily    RTC: 3 months or sooner PRN    I spent a total of 45 minutes on this patient's care on the day of their visit excluding time spent related to any billed procedures. This time includes time spent with the patient as well as time spent documenting in the medical record, reviewing patient's records and tests, obtaining history, placing orders, communicating with other healthcare professionals, counseling the patient, family, or caregiver, and/or care coordination for the diagnoses above.    Sherral Hammers, MD  Assistant Professor  [facility]

## 2023-05-01 NOTE — Telephone Encounter (Signed)
MEDICATION PRIOR AUTHORIZATION    Rx (name, strength, formulation): Lubiprostone    PA Status: Approved  Submitted on 05/01/23  Via CMM (Key: [id number]) as urgent    Insurance: MEDICAL RX   Member ID: [id number]    QTY and Day Supply: 180    Comments: Approved    Ronney Asters

## 2023-05-07 NOTE — Telephone Encounter (Signed)
Patient/Caregiver calling for the following reason: Patient called and she scheduled the appointment for the CT that Dr. Sherral Hammers wanted but patient has Axiety and she is afraid she will have this while going through this CT Scan. She was wondering if she can be given anything to help. She would like a call back ASAP as she has this appointment coming up on 05/30/2023    Preferred mode of communication is Phone number on file.  Preferred phone number:  [phone]    Molli Knock to leave message Yes.      Amy Levester Fresh  [facility]

## 2023-05-08 NOTE — Telephone Encounter (Signed)
Left VM with my direct line for callback.

## 2023-05-30 ENCOUNTER — Inpatient Hospital Stay: Admit: 2023-05-30 | Discharge: 2023-06-05 | Payer: MEDICAID | Primary: Physician

## 2023-05-30 DIAGNOSIS — K5010 Crohn's disease of large intestine without complications: Secondary | ICD-10-CM

## 2023-05-30 LAB — POCT CREATININE (~~LOC~~): Creatinine (POCT): 0.7 mg/dL (ref 0.6–1.3)

## 2023-05-30 MED ORDER — IOHEXOL 350 MG IODINE/ML INTRAVENOUS SOLUTION
350 | Freq: Once | INTRAVENOUS | Status: AC
Start: 2023-05-30 — End: 2023-05-30
  Administered 2023-05-30: 17:00:00 150 mL via INTRAVENOUS

## 2023-05-30 MED ORDER — IOHEXOL 350 MG IODINE/ML INTRAVENOUS SOLUTION
350 | Freq: Once | INTRAVENOUS | Status: DC
Start: 2023-05-30 — End: 2023-05-31

## 2023-07-04 MED ORDER — MESALAMINE 1.2 GRAM TABLET,DELAYED RELEASE
1.2 gram | ORAL_TABLET | Freq: Every day | ORAL | 5 refills | Status: DC
Start: 2023-07-04 — End: 2023-07-20

## 2023-07-20 MED ORDER — MESALAMINE 1.2 GRAM TABLET,DELAYED RELEASE
1.2 | ORAL_TABLET | Freq: Every day | ORAL | 5 refills | Status: DC
Start: 2023-07-20 — End: 2024-02-15

## 2023-07-24 ENCOUNTER — Ambulatory Visit
Admit: 2023-07-24 | Discharge: 2023-08-06 | Payer: Medicaid (Managed Care) | Attending: "Endocrinology | Primary: Physician

## 2023-07-24 DIAGNOSIS — E2740 Unspecified adrenocortical insufficiency: Secondary | ICD-10-CM

## 2023-07-24 MED ORDER — HYDROCORTISONE 5 MG TABLET
5 | ORAL_TABLET | ORAL | 1 refills | Status: DC
Start: 2023-07-24 — End: 2024-01-06

## 2023-07-24 NOTE — Patient Instructions (Signed)
 ADRENAL INSUFFICIENCY  WHAT YOU NEED TO KNOW    Adrenal insufficiency is a condition where the body is unable to produce a normal supply of the hormone cortisol, and sometimes also can't produce the hormone aldosterone.  These hormones control blood pressure and metabolism.  Symptoms of adrenal insufficiency include feeling tired, dizzy, nauseated, feverish, and having belly pain.  Some people with adrenal insufficiency may also notice salt craving and darkening of the skin.  With proper treatment, the symptoms of adrenal insufficiency will not be present very often.    Treatment of adrenal insufficiency involves taking steroid hormone pills to replace cortisol (and aldosterone if needed).  Cortisol is replaced with prednisone or hydrocortisone.  Aldosterone is replaced with fludrocortisone.    Make sure to take your steroid medication every day as prescribed by your doctor.  Missing doses can cause symptoms of adrenal insufficiency to develop, and can be life-threatening!  If you are sick with a cold or flu, take double your dose of prednisone or hydrocortisone until you are feeling better.  If you are vomiting and can't keep pills down, go to your nearest emergency room to get a shot of a steroid medicine.  Get a Medic Alert bracelet or necklace saying adrenal insufficient - give steroids and be sure to wear it all the time.  You may wish to ask for injectable steroids to keep with you if you are traveling or camping and may not be near a hospital.  If you aren't sure what to do or have questions, call Dr. Leonor Liv [phone]

## 2023-07-24 NOTE — Progress Notes (Signed)
 ENDOCRINE CLINIC RETURN VISIT    Date of Service: 07/24/2023    Chelsea Ball returns in follow up of secondary adrenal insufficiency and osteoporosis.    Pt saw Modesto Andreas MD once and his note reports:   Chelsea Ball is a 63 y.o. woman who is referred for adrenal insufficiency.     eConsult by Dr. Randy Buttery 11/04/20 reviewed     She has followed with [facility] [name] since 2020. She has a hx of irritable bowel syndrome, diverticulitis, cholecystectomy, and was referred for abdominal pain. 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 She subsequently developed significant fatigue, and had labs checked which showed:     12/08/20 (746am) - ACTH < 5, cortisol 0.5     After these results, she was started on hydrocortisone 10/5. She feels her energy level has slightly improved, but is still continuing to feel overall fatigued and having difficulty sustaining energy throughout the day. She takes 10mg  around 10am, and then 5mg  around 1pm. We discussed more optimal timing of hydrocortisone (e.g. 8-9am and 3-4pm), as well as stress dosing and obtaining a medical alert bracelet (provided order form for the bracelet). Also discussed the diagnosis of adrenal insufficiency and importance of consistently taking hydrocortisone.      She feels her heartburn has worsened recently and she is out of Nexium. Is having difficulty figuring out the right dosing of laxative to balance her constipation/diarrhea. No current abd pain, n/v, or lightheadedness. She is going on a cruise starting at the end of January 2023.     No headaches, no hx of immunotherapy/checkpoint-inhibitor treatments.     05/2021: MRI pituitary normal  Since that visit in 03/2021 pt next saw E Anderson NP 09/2021 on [facility] 15/5. Intermittently on opiates.  Had been on lower dose  (GI upset, thin stools) improved on increase from 10/5 to 15/5. Actually takes it 15 mg PO Q AM. She has been told she has UC. Does not recall sick day management or what to do if vomiting.     DEXA: (Date: 10/2022, Facility: [facility], Manufacturer: H)  Region T-score Z-score % Change (*)   Lumbar Spine -2.5 -0.9 Baseline   Femoral Neck -1.5       Total Hip -1.4          At her first visit w me 09/2022 I asked for pt to call GI clinic that day [name] from GI to weigh in on whether tapering steroids was causing GI sxs to flare. [name] recommended increase in mesalamine briefly. In Sept [name] recommended an increase in docusate for diverticulosis. Has new GI sched for next year in January. Has upper back pain. Is edentulous. Was waiting for referral for dental implants but is no longer planning this.   10/2022: 25-D 30, Ca 9.4    At her 01/2023 visit plan was for her to take hydrocortisone 15/5 mg BID and to get Reclast. She got it 02/2023 and now reports she had nausea for over a week. Seen 04/2023 at which time she was about to see new GI doctor (Dr. [name]) and see about tapering steroids. Per his notes he felt her sxs were mainly due to IBS and she was given amitiza and psyllium. They will follow up next month. Today she reports  difficulty turning head. Released from pain management clinic clinic for this because she refused steroid injections.      Other ongoing medical issues from the patient's PMH include   Past Medical History:   Diagnosis Date    Abdominal pain 01/21/2019    Asthma     Bipolar 1 disorder (CMS code) 01/21/2019    Depression 01/21/2019    GERD (gastroesophageal reflux disease) 05/04/2016    History of motion sickness     PONV (postoperative nausea and vomiting)        Current medications   Current Outpatient Medications   Medication Sig Dispense Refill    beclomethasone (QVAR REDIHALER) 80 mcg/actuation breath activated inhaler       calcium  carbonate-vitamin D 600 mg(1,500mg ) -400 unit tablet       docusate sodium (COLACE) 100 mg capsule Take 1 capsule (100 mg total) by mouth daily 90 capsule 3    HYDROcodone-acetaminophen (NORCO) 5-325 mg tablet Take 1 tablet by mouth      hydrocortisone (CORTEF) 5 mg tablet Three PO Q AM and one PO Q PM 360 tablet 1    loratadine (CLARITIN) 10 mg tablet Take 1 tablet (10 mg total) by mouth daily      lubiprostone (AMITIZA) 24 mcg capsule Take 1 capsule (24 mcg total) by mouth in the morning and 1 capsule (24 mcg total) in the evening. Take with meals. 180 capsule 0    mesalamine (LIALDA) 1.2 gram EC tablet TAKE 1 TABLET BY MOUTH EVERY DAY 30 tablet 5    pregabalin (LYRICA) 75 mg capsule Take 1 capsule (75 mg total) by mouth daily as needed       No current facility-administered medications for this visit.       There have been no significant changes to the patient's PMH or FH.    SOC:   Social History     Socioeconomic History    Marital status: Single     Spouse name: Not on file    Number of children: Not on file    Years of education: Not on file    Highest education level: Not on file   Occupational History    Not on file   Tobacco Use    Smoking status: Every Day     Current packs/day: 1.00     Average packs/day: 1 pack/day for 10.0 years (10.0 ttl pk-yrs)     Types: Cigarettes    Smokeless tobacco: Never    Tobacco comments:     3 cigarettes/day   Substance and Sexual Activity    Alcohol use: Yes     Alcohol/week: 4.0 - 5.0 standard drinks of alcohol     Types: 2 - 3 Glasses of wine, 2 Cans of beer per week    Drug use: Not Currently     Comment: cocaine 7 years ago    Sexual activity: Not on file   Other Topics Concern    Not on file   Social History Narrative    Not on file     Social Drivers of Health     Financial Resource Strain: Not on File (11/27/2017)    Received from [employer], [employer]     Financial Resource Strain: 0   Food Insecurity: Not on File (01/04/2023)    Received from  [employer]     Food: 0   Transportation Needs: Not on File (11/27/2017)  [facility]eds     Transportation: 0   Housing Stability: Not on File (11/27/2017)    Received from [place], [employer]    Housing Stability     Housing: 0       Physical Exam:   GENERAL: The patient looks well.   VITAL SIGNS: Blood pressure 116/77, pulse 88, height 154.4 cm (5' 0.79"), weight 73.3 kg (161 lb 11.2 oz). Body mass index is 30.77 kg/m.  HEENT: Voice strong.  SPINE: No kyphosis or spinal tenderness.  NEURO: No tremor of the outstretched hands.  PSYCH: Normal mood and affect.    Records Reviewed Today:  - labs: above  - imaging: above    Assessment & Recommendations:  Chelsea Ball returns for follow up of adrenal insufficiency and osteoporosis.    Our plan:  Iatrogenic Adrenal Insufficiency:  - sick day mgmt reviewed  - today we will reduce hydrocortisone to 12.5/5 mg BID  - Pt to see Edmund Gouge in 2 weeks in person (office to schedule) to continue w steroid taper if able. She may need to communicate with GI Dr. [name] re: whether any of pt's emerging GI sxs represent something appropriate to treat with increasing glucocorticoids or whether it is acceptable to continue tapering as other treatments are being introduced by GI for her sxs.    Osteoporosis:  - no longer planning on dental implants  - Reclast one year after last one    Neck Pain:  - managed by pain service  - I offered pt an urgent care visit on the first floor of the building where we met today ([address]) which she declined.    Follow up w in me one year Valorie Gearing NP to arrange for when appropriate).    The patient and/or family verbalized understanding of our recommendations regarding further evaluation and treatment. The importance of taking all medications as prescribed was emphasized, as was the need to attend all recommended future imaging studies, laboratory testing, referrals, and follow-up visits.          Fransisco Jack MD, PhD  Professor of Medicine and Surgery  Division of Endocrinology and Metabolism  Endocrine Clinic phone: [phone]  fax [phone]  Hospital ID # [id number]

## 2023-08-08 ENCOUNTER — Ambulatory Visit: Admit: 2023-08-08 | Payer: Medicaid (Managed Care) | Attending: Nurse Practitioner | Primary: Physician

## 2023-08-08 DIAGNOSIS — E2740 Unspecified adrenocortical insufficiency: Secondary | ICD-10-CM

## 2023-08-08 NOTE — Progress Notes (Signed)
 [facility] Follow Up Note:    Ms. Dyer returns in follow up of secondary adrenal insufficiency and osteoporosis.    Last visit: 07/24/2023 w/Dr. Geraldo Klippel       Endocrine history:  (adapted from prior notes)  Pt saw Modesto Andreas MD once and his note reports:   Kesiah Spitzley is a 63 y.o. woman who is referred for adrenal insufficiency.  eConsult by Dr. Randy Buttery 11/04/20 reviewed  She has followed with [facility] [name] since 2020. She has a hx of irritable bowel syndrome, diverticulitis, cholecystectomy, and was referred for abdominal pain. 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 She subsequently developed significant fatigue, and had labs checked which showed:     12/08/20 (746am) - ACTH < 5, cortisol 0.5     After these results, she was started on hydrocortisone 10/5. She feels her energy level has slightly improved, but is still continuing to feel overall fatigued and having difficulty sustaining energy throughout the day. She takes 10mg  around 10am, and then 5mg  around 1pm. We discussed more optimal timing of hydrocortisone (e.g. 8-9am and 3-4pm), as well as stress dosing and obtaining a medical alert bracelet (provided order form for the bracelet). Also discussed the diagnosis of adrenal insufficiency and importance of consistently taking hydrocortisone.      She feels her heartburn has worsened recently and she is out of Nexium. Is having difficulty figuring out the right dosing of laxative to balance her constipation/diarrhea. No current abd pain, n/v, or lightheadedness. She is going on a cruise starting at the end of January 2023.     No headaches, no hx of immunotherapy/checkpoint-inhibitor treatments.     05/2021: MRI pituitary normal  Since that visit in 03/2021 pt next saw  E Jaramie Bastos NP 09/2021 on [facility] 15/5. Intermittently on opiates. Had been on lower dose  (GI upset, thin stools) improved on increase from 10/5 to 15/5. Actually takes it 15 mg PO Q AM. She has been told she has UC. Does not recall sick day management or what to do if vomiting.     DEXA: (Date: 10/2022, Facility: [facility], Manufacturer: H)  Region T-score Z-score % Change (*)   Lumbar Spine -2.5 -0.9 Baseline   Femoral Neck -1.5       Total Hip -1.4          At her first visit w Dr. Geraldo Klippel- 09/2022 I asked for pt to call GI clinic that day [name] from GI to weigh in on whether tapering steroids was causing GI sxs to flare. [name] recommended increase in mesalamine briefly. In Sept [name] recommended an increase in docusate for diverticulosis. Has new GI sched for next year in January. Has upper back pain. Is edentulous. Was waiting for referral for dental implants but is no longer planning this.   10/2022: 25-D 30, Ca 9.4    At her 01/2023 visit plan was for her to take hydrocortisone 15/5 mg BID and to get Reclast. She got it 02/2023 and now reports she had nausea for over a week. Seen 04/2023 at which time she was about to see new GI doctor (Dr. [name]) and see about tapering steroids. Per his notes he felt her sxs were mainly due to IBS and she was given amitiza and psyllium.  They will follow up next month. Today she reports difficulty turning head. Released from pain management clinic clinic for this because she refused steroid injections.       Interval History today 08/08/2023:  Presents for follow up.   Reports feeling pretty well, but continues to experience "tummy troubles" every day. Saw Dr. [name] last month and has been taking amitiza and psyllium, not sure if it's helping or not.  Taking reduced dose of hydrocortisone to 12.5mg  daily, states she never took the afternoon dose- would miss it due to needing to take other meds.   Hs felt low energy for years, but has noticed no change in the past 2 weeks  since dose reduction.   Denies AI symptoms such as feeling faint, headache, N/V/D, loss of appetite or salt cravings.   No recent sick days, understands need to double dose - has sufficient supply at home        Other ongoing medical issues from the patient's PMH include   Past Medical History:   Diagnosis Date    Abdominal pain 01/21/2019    Asthma     Bipolar 1 disorder (CMS code) 01/21/2019    Depression 01/21/2019    GERD (gastroesophageal reflux disease) 05/04/2016    History of motion sickness     PONV (postoperative nausea and vomiting)        Current medications   Current Outpatient Medications   Medication Sig Dispense Refill    beclomethasone (QVAR REDIHALER) 80 mcg/actuation breath activated inhaler       calcium carbonate-vitamin D 600 mg(1,500mg ) -400 unit tablet       docusate sodium (COLACE) 100 mg capsule Take 1 capsule (100 mg total) by mouth daily 90 capsule 3    HYDROcodone-acetaminophen (NORCO) 5-325 mg tablet Take 1 tablet by mouth      hydrocortisone (CORTEF) 5 mg tablet 2.5 tabs PO Q AM and one tab PO Q PM 300 tablet 1    loratadine (CLARITIN) 10 mg tablet Take 1 tablet (10 mg total) by mouth daily      mesalamine (LIALDA) 1.2 gram EC tablet TAKE 1 TABLET BY MOUTH EVERY DAY 30 tablet 5    pregabalin (LYRICA) 75 mg capsule Take 1 capsule (75 mg total) by mouth daily as needed       No current facility-administered medications for this visit.       There have been no significant changes to the patient's PMH or FH.    SOC:   Social History     Socioeconomic History    Marital status: Single     Spouse name: Not on file    Number of children: Not on file    Years of education: Not on file    Highest education level: Not on file   Occupational History    Not on file   Tobacco Use    Smoking status: Every Day     Current packs/day: 1.00     Average packs/day: 1 pack/day for 10.0 years (10.0 ttl pk-yrs)     Types: Cigarettes    Smokeless tobacco: Never    Tobacco comments:     3 cigarettes/day    Substance and Sexual Activity    Alcohol use: Yes     Alcohol/week: 4.0 - 5.0 standard drinks of alcohol     Types: 2 - 3 Glasses of wine, 2 Cans of beer per week    Drug use: Not Currently     Comment: cocaine 7  years ago    Sexual activity: Not on file   Other Topics Concern    Not on file   Social History Narrative    Not on file     Social Drivers of Health     Financial Resource Strain: Not on File (11/27/2017)    Received from [employer], [place]    [employer]     Financial Resource Strain: 0   Food Insecurity: Not on File (01/04/2023)    Received from [employer]     Food: 0   Transportation Needs: Not on File (11/27/2017)    Received from [place], [employer]     Transportation: 0   Housing Stability: Not on File (11/27/2017)    Received from [employer], [employer] Stability     Housing: 0       Physical Exam:     VITAL SIGNS:   Vitals:    08/08/23 1342   BP: (P) 108/65   Pulse: (P) 81   Weight: (P) 73 kg (161 lb)   Height: (P) 154.9 cm (5\' 1" )       Physical Exam  Constitutional:       Appearance: Normal appearance.   HENT:      Head: Normocephalic.   Cardiovascular:      Rate and Rhythm: Normal rate.   Pulmonary:      Effort: Pulmonary effort is normal.   Neurological:      General: No focal deficit present.      Mental Status: She is alert. Mental status is at baseline.   Skin:     General: Skin is warm and dry.      Capillary Refill: Capillary refill takes less than 2 seconds.   Psychiatric:         Mood and Affect: Mood normal.         Behavior: Behavior is cooperative.         Thought Content: Thought content normal.         Judgment: Judgment normal.   Vitals reviewed.         Records Reviewed Today:  - labs: I have reviewed    Latest Reference Range & Units 12/08/20 07:46 05/20/21 07:32 05/20/21 07:34 09/23/21 07:58   ACTH, Plasma 6 - 50 pg/mL <5 (L) <5 (L) <5 (L) <5 (L)   Cortisol, serum mcg/dL 0.5 (L) <4.1 (L) <3.2 (L) <0.5 (L)   Z-Score (Female) -2.0 - 2.0 SD    -1.0    IGF-1, Adult 50 - 317 ng/mL   79    Prolactin ng/mL   9.7    Free T4 0.8 - 1.8 ng/dL   0.9 1.1   Thyroid Stimulating Hormone 0.40 - 4.50 mIU/L   5.36 (H) 2.37     - imaging: I have reviewed  BONE DENSITOMETRY REPORT 10/25/2022     L1-L4 Total  BMD values= 0.873   T-score= -2.5  Z-score= -0.9                          Total Femur  BMD values= 0.816  T-score= -1.4 Z-score= -0.6     Femur Neck  BMD values= 0.735  T-score= -1.5 Z-score= -0.4     Positioning, artifacts and morphologic findings:  Multilevel degenerative changes of the lumbar spine      Assessment & Recommendations:  Ms. Mcgarrigle returns for follow up of adrenal  insufficiency and osteoporosis.  Currently taking reduced dose of 12.5mg  since last visit w/Dr. Geraldo Klippel 2 weeks ago. She has not noticed much difference, deals with chronic abdominal discomfort and frequent bms. Denies any N/V/D or worsening since dose reduction. She tells me she never took any of the afternoon doses (5mg ) of HC because of needing to take her other meds. Thus she has been taking HC 12.5mg  daily.   No concerns for AI today, I will reach out to Dr. [name] to collaborate on dose reduction as I do think we can reduce down to 10mg . For now, cont 12.5mg . Will obtain am fasting cortisol/ACTH.    Osteoporosis:  - no longer planning on dental implants  - #1 Reclast 02/27/2023- plan to repeat 02/2024      Follow up:  3 weeks NP   07/2024 Dr. Fransisco Jack D. Alva Jewels, NP  [facility]   08/08/2023      APP Visit Information:   APP Service Type:  Independent  Available MD consultant:  Marcellina Sermon, MD  I attest that I have verbally informed the patient that I am an Advanced Practice Provider and explained my role in their team-based care.    I spent a total of 41 non-overlapping minutes on this patient's care on the day of their visit excluding time spent related to any billed procedures. This time includes time spent with the patient as well as time spent documenting in  the medical record, reviewing patient's records and tests, obtaining history, placing orders, communicating with other healthcare professionals, counseling the patient, family, or caregiver, and/or care coordination for the diagnoses above.

## 2023-08-08 NOTE — Patient Instructions (Signed)
 After Your Visit with [facility]:    Very nice to speak with you today!  Please continue your hydrocortisone dose 12.5mg  daily     Let's plan on seeing you again in 2-3 weeks    Please have your labs drawn 1 week beforehand so we can review the results together. Make sure to hold the hydrocortisone the morning of and afternoon before your labs. Get the labs done in the morning, FASTING.  Labs have been ordered and printed lab slips for you     Sincerely,  Reagan Camera. Alva Jewels, NP   Endocrinology      ADRENAL INSUFFICIENCY  WHAT YOU NEED TO KNOW  Emergency Toolkit  Adrenal insufficiency is a condition where the body is unable to produce a normal supply of the hormone cortisol, and sometimes also can't produce the hormone aldosterone.  These hormones control blood pressure and metabolism.  Symptoms of adrenal insufficiency include feeling tired, dizzy, nauseated, feverish, and having belly pain.  Some people with adrenal insufficiency may also notice salt craving and darkening of the skin.  With proper treatment, the symptoms of adrenal insufficiency will not be present very often.    Treatment of adrenal insufficiency involves taking steroid hormone pills to replace cortisol (and aldosterone if needed).  Cortisol is replaced with prednisone or hydrocortisone.  Aldosterone is replaced with fludrocortisone.     Daily Treatment:  Take your prescribed medicines for adrenal insufficiency every day.     Sick day Rules for patients on steroids:  If you have adrenal insufficiency and are sick, be aware that you will need increased doses of your glucocorticoid (steroid) medication  If you are ill, for example- fever; illnesses for which you are on antibiotics such as pneumonia, urinary tract infections; diarrhea; nausea or vomiting- then you should double or triple the steroid dose (hydrocortisone, prednisone, or dexamethasone) while you are unwell.    Take a double dose for mild to moderate symptoms and a  triple dose for severe symptoms   Contact your doctor if you are not better within 1 week. When you are better, return to your regular daily dose.   If you are unable to keep down the oral steroid medicine and keep having vomiting and diarrhea, then this is an emergency and you should go to the local ER. If your doctor has given you a prescription for the hydrocortisone injection (Solu-Cortef), then you should give yourself the injection (or have a family member give you the injection) and then go straight to the ER.   When you are sick, you should drink sugar and salt-containing liquids to help prevent dehydration    Other Precautions:  Educate family members and/or friends about your condition  Carry the medical emergency card (sent from our clinic) indicating you have adrenal insufficiency  Additionally, you can wear a medical alert bracelet or necklace indicating you have adrenal insufficiency  If you are having an outpatient procedure such as colonoscopy, barium enema, arteriography, or minor surgery, then talk to the doctors performing the procedure about the steroids. Usually, you should double up the steroid on the day of the procedure and if you are back to normal, then can go back to your regular dose the following day.    Be aware of the following signs and symptoms of acute glucocorticoid deficiency:  Profound fatigue  Nausea/vomiting  Light headedness  Poor appetite/weight loss  Abdominal pain, loose stool    Below is a suggested list of important supplies that you  may need in an emergency:  [  ]  Sufficient supply of hydrocortisone  [  ]  If you have primary adrenal insufficiency, sufficient supply of fludrocortisone   [  ]  Based on your doctor's discretion: Solu-Cortef emergency injection kit including Solu-Cortef vial, syringes, needles, alcohol swabs   [  ]  Medical emergency card  [  ]  Medical alert bracelet or necklace    For further questions/concerns, please  visit:  [email or web address]  [email or web address] [id number]    Contact Information for [facility]    Your lab results, if your lab tests are done at a [facility] lab, will be available through MyChart, a patient web portal to your health record at [facility].  Using MyChart, you can also request medication refills and send an electronic message to your physician if you have questions.  These messages are sent in secure fashion and are maintained as part of your health record.  MyChart is the best way to reach your physician if you have questions or issues in between office visits.     Our clinic staff can be reached by calling [phone] and our clinic fax number is [phone].     Radiology scheduling for imaging studies can be reached at [phone]       Nurse Triage Line: [phone], Monday-Friday 8:00am-4:30pm  Please call this number to report symptoms, or if you have any questions or concerns prior to your next appointment. Please have the following information available when calling: patient's name, date of birth or medical record number and provider's name.      After hours answering service: [phone]   If you have an urgent/emergent problem or have a question or concern after 5pm, on the weekend, or on a holiday, please call this number and ask the operator to page the on call Endocrinologist.     MyChart Messages  For your safety and best care, please DO NOT use MyChart messages to report symptoms. (Symptoms should be reported by calling the nurse triage line). Please use MyChart for non-urgent matters such as general questions, non-urgent prescription refills, or non-urgent scheduling issues.   - Please do not use MyChart for messages in the evenings or on weekends, as messages are only checked  during regular business hours.   - Please note that MyChart messages are routed to a central pool and one of your provider's team members will get back to you.  - Expect up to 3 or more business days for a response

## 2023-08-13 MED ORDER — LUBIPROSTONE 24 MCG CAPSULE
24 | ORAL_CAPSULE | Freq: Two times a day (BID) | ORAL | 0 refills | 30.00000 days | Status: DC
Start: 2023-08-13 — End: 2023-08-28

## 2023-08-19 LAB — ADRENOCORTICOTROPIC HORMONE: ACTH, Plasma: <5 pg/mL — ABNORMAL LOW (ref 6–50)

## 2023-08-19 LAB — CORTISOL, SERUM: Cortisol, serum: 0.5 ug/dL — ABNORMAL LOW

## 2023-08-28 ENCOUNTER — Telehealth
Admit: 2023-08-28 | Discharge: 2023-08-28 | Payer: Medicaid (Managed Care) | Attending: Student in an Organized Health Care Education/Training Program | Primary: Physician

## 2023-08-28 DIAGNOSIS — K219 Gastro-esophageal reflux disease without esophagitis: Secondary | ICD-10-CM

## 2023-08-28 MED ORDER — LUBIPROSTONE 24 MCG CAPSULE
24 | ORAL_CAPSULE | Freq: Two times a day (BID) | ORAL | 0 refills | 30.00000 days | Status: AC
Start: 2023-08-28 — End: 2023-11-26

## 2023-08-28 MED ORDER — OMEPRAZOLE 20 MG CAPSULE,DELAYED RELEASE
20 | ORAL_CAPSULE | Freq: Two times a day (BID) | ORAL | 0 refills | 30.00000 days | Status: DC
Start: 2023-08-28 — End: 2023-11-28

## 2023-08-28 NOTE — Progress Notes (Signed)
 Subjective    Chelsea Ball is a 63 y.o. female who presents with the following:             History of Present Illness     08/28/23 Chelsea Ball):  This is follow up visit. In interim, had normal CTAP 05/2023. She started Amitiza once daily in addition to continuing mesalamine. She has 2-3 BM/d without bleeding but still has mild abdominal discomfort. Unclear why didn't take twice daily Amatiza but would like new Rx. Hasn't received omeprazole but GERD is stable. Overall feels ok but thinks stools are small which leads to more frequent trips to bathroom.    Previous Clinic Visits  04/26/23 Chelsea Ball):  This is initial visit with me, previously followed with Dr. Renard Ball. In interim, continues to have abdominal pain and constipation. Notes having 1-2 days per where she feels well without pain or constipation but the other days she has bilateral lower abdominal pain going to her back worsened with constipation. She notes she has to rock on the commode or distort her self to pass a bowel movement. Takes a stool softener but no other laxatives. Currently taking mesalamine 2.4g/d per day which was changed from 1.2g/d about 1 year ago. Denies having blood in the stool. Lastly has acid reflux at times but is not taking omeprazole regularly. No other acute complaints.    07/05/22 Chelsea Ball):  "This is a 63 y.o. female with history of irritable bowel syndrome with constipation, diverticulitis, cholecystectomy (2019), depression, substance use and hysterectomy who is referred to gastroenterology for diverticulosis with abdominal pain and incomplete colonoscopy, following up after colonoscopy was able to be completed.      Background history:  Referred from Dr. [name] in [place]. Consult question: "Patient with diverticulosis / diverticulitis (treated).Chelsea Ball disorted lumen in splenic flexure and descending colon- Please evaluate for possible surgery?"     The patient describes her problem as having problems having a bowel movement.  When she eats she has to eat some fruit to have a bowel movement and it can be runny. The patient reports weight loss because she gets severe stomach pain when she eats. The pain is happening on a daily basis. The entire stomach is painful, not one particular area- she feels she has to pant (breathe heavily) which can calm the pain a little bit. She reports that she has been put on bed rest by Dr. [name] because of this. She has had weight loss related to eating less, she was 142 pounds, now down to 115 pounds. The only thing she can eat is soft foods, and reports she's not able to eat much. If she eats more she gets severe pain. She has some nausea but no vomiting. She reports being very weak now. She is also having rotator cuff problem. She reports having bowel movements that are runny - they used to be small, solid, curved, but now have transitioned to being runny. Some days she does not have a bowel movement, and some days she will have a few runny bowel movements. She has some fear of eating. There is no blood in the stools.      She has not seen a nutritionist, does not take any nutrition shakes. She has stopped taking any of her medications because of the abdominal pain, uses Norco maybe once a week for rotator cuff, occasionally takes Calcium.     The patient reads me her medication list, although she is not curently taking these: Mirtazapine 15, megestrol 15, 25mg  hydroxyzine,  6mg  tizanadine PRN, calcium +vitamin D, docusate, fluoxetine, lamotrigine, albuterol, QVAR, Norco 5. She takes esomeprazole 40mg  as needed for heartburn. She has tried FiberChoice tablets, didn't help her. Prior medicines from Dr. [name] gave her diarrhea. She tried miralax. She takes lactulose sometimes when constipated, helps her go. No enemas. Her prior medication list from June 2020 note of Dr. [name] also mentions several PPI's, as well as metamucil, Creon, Amitiza, Bentyl, despiramine, Donnatal, fleet's enemas, but she  reports prior medicines prescribed by Dr. [name] just gave her diarrhea so she's not on them now.      She was seen in June 2020 by her gastroenterologist Dr. [name] who noted that he had attempted colonoscopy but was unable to advance past an area.      Her cholecystectomy did not help her pain in 2019.      Note, utox has been positive for cocaine metabolites on 05/29/18, 12/25/16, ad 01/25/16.      02/28/19 phone visit:  At my initial clinic visit on 01/21/19, I planned to repeat colonoscopy with anesthesia. 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 I started the patient on mesalamine 2.4g daily which she did not tolerate after it causing sour stomach for a week.      At first she was having small bowel movements with postprandial abdominal pain. She is still having thin stools and postprandial discomfort that begins even right when she begins to eat a meal that has caused some fear of eating larger meals. At our 02/28/19 visit she is a little better but not much. Now she reports walking more after being off bed rest and is having still thin bowel movements. She reports still not eating very well but thinks she has not lost further weight. She has not filled other medications with her PCP at this point. She is back on her psych meds. She takes opioids but only rarely, not every day.      05/21/19 visit:  At our 04/29/18 video visit, she was given a course of rifaximin with some improvement initially but symptoms returned. She was prescribed a course of Uceris but was not able to get this due to insurance for a couple of months.      At our 05/21/19 visit, she reports symptoms are getting worse. She has frequent small volume diarrhea, sometimes with urges to have a bowel movement but nothing comes out. When she does have bowel movments, it tends to be small bits or  runny. She has had some episodes of incontinence. Some days she goes to the bathroom every 20 or 30 minutes and bowel movements are still very thin- bowel movements are like very long worms if it's not watery. Her abdominal pain is severe- reports it is worse than labor pains, intermittent spasms affecting the whole abdomen - she has to lay still and focus on her breathing to get the pain improve. It involves the whole abdomen and tends to happen when she is on the toilet trying to have a bowel movement. She continues to be afraid to eat because when she eats she usually has to go to the bathroom within 10-15 minutes for a bowel movement and then she gest the pain while on the toilet. She changes her body position (leaning backwards) sometimes helps a little stool come out. She continues to lose weight, she thinks now down  to maybe 100 pounds although does not have a scale to confirm this.      Interval Events- 08/29/19 visit:   At our 05/21/19 visit I had referred the patient to colorectal surgery given the ongoing weight loss and severity of her symptoms. She then had the Uceris approved and started it June 20, 2019 and did experience some improvement in symptoms so surgery was not pursued at the time. She reported constipation and was prescribed Miralax in April 2021.      She is continuing to use Miralax with sometimes loose stools. Appetite is increased on the steroids. She feels a lot better on the Uceris, not miserable and waking up overnight anymore. She still has some episodes of wanting to poop and not being able to poop. She's eating moderately. She has 3 more weeks of Uceris left right now. Her weight has gone up, 113# up to 130.      Interval Events: 01/26/20  She had an ED visit 11/05/19 for abdominal pain. Mobic was refilled and gabapentin prescribed.      Lower abd pain, sometimes wrapping around to the back. Pelvic ultrasound was normal. Has been having problems with bowel movements. Has not received  budesonide this week. Entire lower abdomen. miralax works only fair. Budesonide was increasing appetite, but eating makes pain worse. Throughout day there is a periodic ache. bm's 1-2 times per day, but has to lean back and forth to have bm's, normally 3-4 small piees in the toilet. Symptoms tend to be relieved with bowel movements. Weight is around 142.      Interval events: 01/29/20:  01/29/20 plan was to take Uceris every other day for 3 months to see if it can be weaned, prescribed ongoing miralax and a bowel preparation in case needed. Referred for anorectal manometry/biofeedback but insurance reportedly does not cover biofeedback. Taking miralax every other day because daily use gives diarrhea. PCP tapering pain medications. Entocort 3mg  tabs prescribed     She's having problems with the budesonide. She is back to feeling how she was before starting steroids. Bowel habits have been loose and runny, back and forth to the bathroom all day, with stomach pains. Appetite has been low from stomach hurting, very bad, weight is now going down again. This happened when budesonide changed from 9 to 3. Currently not taking miralax. No vomiting. Weight is now 134.      Interval Events 05/31/20:   Pain comes and goes, pants/breathes wih it. Happening 4-5 times per day. Pain is in the lower abdomen. Appetite increased a bit, weight coming back. Weight 141 now. Taking probiotic. Stools are smaller with some mucus. Stools are soft solids. Budesonide helps with the appetite and keeping weight up but not with the abdominal pains.      Interval events 07/01/20  Thinks mesalamine may be working. Weight is steady, 140. However still having lots of mucus frequently during the day and with bowel movements. Feels like it helps mucus come out. Bowel movements now formed and length of her finger or slightly longer. Takes 2 budesonides 3mg  with mesalamine. She has questions about if ERCP is needed. Feels a problem with swallowing in the  neck and throat, not in the chest.      07/19/20 phone call w blood in stool  08/16/20 course of metronidazole for suspected SIBO  09/2020 colonoscopy: advised stop budesonide, +polyps, next colonoscopy recommended in 3 years.   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; 3 day course of flagyl; still on mesalamine, stopped probiotic bc of expense, advised try peppermint oil  12/29/21 patienet called notifying that can't take flagyl anymore  06/14/22 call in with abdominal pain"      Allergies/Contraindications   Allergen Reactions    Metronidazole Other (See Comments)     Flares up yeast     Outpatient Encounter Medications as of 08/28/2023   Medication Sig Dispense Refill    beclomethasone (QVAR REDIHALER) 80 mcg/actuation breath activated inhaler       calcium carbonate-vitamin D 600 mg(1,500mg ) -400 unit tablet       docusate sodium (COLACE) 100 mg capsule Take 1 capsule (100 mg total) by mouth daily 90 capsule 3    HYDROcodone-acetaminophen (NORCO) 5-325 mg tablet Take 1 tablet by mouth      hydrocortisone (CORTEF) 5 mg tablet 2.5 tabs PO Q AM and one tab PO Q PM 300 tablet 1    loratadine (CLARITIN) 10 mg tablet Take 1 tablet (10 mg total) by mouth daily      lubiprostone (AMITIZA) 24 mcg capsule TAKE 1 CAPSULE (24 MCG TOTAL) BY MOUTH IN THE MORNING AND 1 CAPSULE (24 MCG TOTAL) IN THE EVENING. TAKE WITH MEALS. 180 capsule 0    mesalamine (LIALDA) 1.2 gram EC tablet TAKE 1 TABLET BY MOUTH EVERY DAY 30 tablet 5    pregabalin (LYRICA) 75 mg capsule Take 1 capsule (75 mg total) by mouth daily as needed       No facility-administered encounter medications on file as of  08/28/2023.     Past Medical History:   Diagnosis Date    Abdominal pain 01/21/2019    Asthma     Bipolar 1 disorder (CMS code) 01/21/2019    Depression 01/21/2019    GERD (gastroesophageal reflux disease) 05/04/2016    History of motion sickness     PONV (postoperative nausea and vomiting)        Past Surgical History:   Procedure Laterality Date    CHOLECYSTECTOMY  04/26/2017    HYSTERECTOMY      right open carpal tunnel release (Right Wrist)  05/15/2018     Family History   Problem Relation Name Age of Onset    Malig hyperten Neg Hx      Malig hypertherm Neg Hx      Anesth problems Neg Hx      Bleeding disorder Neg Hx         Social History     Tobacco Use    Smoking status: Every Day     Current packs/day: 1.00     Average packs/day: 1 pack/day for 10.0 years (10.0 ttl pk-yrs)     Types: Cigarettes    Smokeless tobacco: Never    Tobacco comments:     3 cigarettes/day   Substance and Sexual Activity    Alcohol use: Yes     Alcohol/week: 4.0 - 5.0 standard drinks of alcohol     Types: 2 - 3 Glasses of wine, 2 Cans of beer per week    Drug use: Not Currently     Comment: cocaine 7 years ago  Objective              Physical Exam  General:  Well nourished, well developed, NCAT, no acute distress, non-toxic  Affect:  Normal  HEENT:  No scleral icterus, no conjunctival pallor, mucus membranes moist  Lungs: breathing comfortably on room air  Cardiovascular:  No pallor  Abdomen:  Non-distended  Extremities:  No peripheral edema  Skin:  Non-icteric  Neuro:  Alert and oriented x 3    Review of Prior Testing    08/2022 Colonoscopy:  Impression:         - Severe diverticulosis in the sigmoid colon but no visible inflammation.          - The perianal and digital rectal examinations were normal.          -  Two 1 to 2 mm polyps in the ascending colon, removed with a cold biopsy             forceps.  Resected and retrieved.          -  Two 2 to 3 mm polyps in the transverse colon, removed with a cold biopsy              forceps.  Resected and retrieved.          -  The distal rectum and anal verge are normal on retroflexion view.   Recommendation:         -  Await pathology results.          -  Repeat colonoscopy for surveillance based on pathology results.     FINAL PATHOLOGIC DIAGNOSIS     A. Ascending colon, polyps, biopsy:  Tubular adenoma.     B. Transverse colon, polyps, biopsy:  Tubular adenoma.    11/2020 EGD:  IMPRESSIONS:     1.  The entire examined duodenum appeared normal   2.  The mucosa of the stomach appeared normal   3.  The z-line appeared normal was located 36cm from the incisors   4.  The mucosa of the esophagus appeared normal   5.  Biopsies taken of the mid esophagus to rule out eosinphilic   esophagitis     RECOMMENDATIONS:    Await pathology results   REPEAT EXAM:     EGD as needed for clinical indication.     09/2020 colonoscopy:  IMPRESSIONS:     1.  11 mm sessile polyp was found in the ascending   colon; endoscopic mucosal resection was performed   2.  5 mm sessile polyp was found in the ascending colon; polypectomy   was performed   3.  Four 2 to 4mm mm in size polyps were found in the sigmoid colon;   polypectomy was performed with a cold snare   4.  Three 2 mm polyps were found in the rectum; polypectomy was   performed with a cold snare   5.  Severe non-bleeding diverticulosis was noted in the sigmoid colon   6.  Retroflexed views revealed internal hemorrhoids     RECOMMENDATIONS:     1.  I suspect the recent bleeding was from   internal hemorrhoids.  The diverticulosis did not have a lot of   inflammation today visually.  I recommend slowly going off of the   budesonide medicine since there is not much visible inflammation but   continuing the mesalamine.   2.  Avoid NSAIDS (over the counter pain killers, Aspirin, Ibuprofen,   Aleve, Motrin  etc.) as well as blood thinners (Heparin, Coumadin   etc.) 7 day(s)   REPEAT COLONOSCOPY:     Polyps were found, therefore your next   colonoscopy will be determined  based on your pathology results, and a   MyChart message will be sent to you with results and recommendations.     05/2023 CTAP:  IMPRESSION:      1.  No acute process in the abdomen and pelvis.      2.  Solid pulmonary nodule measuring up to 3 mm in the right middle lobe. Per Fleischner Society guidelines, if the patient is at low risk for lung cancer, no follow-up is required; if the patient is at high risk, follow-up with a low-dose non-contrast CT chest in 12 months is optional.     Assessment and Plan         This is a 39F with history of IBS, diverticulitis, SCAD, CCY (2019), depression substance abuse, hysterectomy who presents for follow up    # IBS-C  # SCAD  # GERD  # Colon polyps    Patient with abdominal pain with constipation, which improves with bowel movements. Unfortunately not taking any laxatives other than stool softeners currently. She tried Miralax in the past but states it caused diarrhea. Started Amitiza once daily but still having incomplete evacuation. We will increase her Amatiza to twice daily to see if this helps. In the future she may need referral to pelvic phsyiology if continues to have incomplete evacuation despite aggressive bowel regimen. Reassuringly her CTAP was unremarkable.     RECS:  - Continue mesalamine 2.4g/d  - Increase amitiza 24 mcg PO BID  - Start omeprazole BID    RTC: 3 months or sooner PRN    I spent a total of 30 minutes on this patient's care on the day of their visit excluding time spent related to any billed procedures. This time includes time spent with the patient as well as time spent documenting in the medical record, reviewing patient's records and tests, obtaining history, placing orders, communicating with other healthcare professionals, counseling the patient, family, or caregiver, and/or care coordination for the diagnoses above.    Bjorn Bullocks, MD  Assistant Professor  [facility]

## 2023-08-28 NOTE — Patient Instructions (Signed)
 Greysen, it was great to meet you in clinic. As discussed, I recommend the following:    Please start Amitiza twice daily and omeprazole twice daily  If you have any questions, please reach out via My Chart    Bjorn Bullocks, MD  Assistant Professor  [facility]

## 2023-08-29 ENCOUNTER — Ambulatory Visit: Admit: 2023-08-29 | Payer: Medicaid (Managed Care) | Attending: Nurse Practitioner | Primary: Physician

## 2023-08-29 DIAGNOSIS — E2740 Unspecified adrenocortical insufficiency: Secondary | ICD-10-CM

## 2023-08-29 NOTE — Patient Instructions (Addendum)
 After Your Visit with [facility]:    Very nice to speak with you today!  Please continue your hydrocortisone dose 2.5 tablets at 0800am every morning  Then at 2pm take 1 tablet of hydrocortisone     0800- take hydrocortisone 2.5 tablets   0900- take omeprazole 1 capsule BEFORE meal  11am- take lubiprostone 1 capsule with meal  2pm - take 1 tablet hydrocortisone   5pm take omeprazole 1 capsule before meal  Then eat dinner  With dinner take mesalamine and lubiprostone     From Dr. [name]-   Please start Amitiza twice daily and omeprazole twice daily    Let's plan on seeing you again in 1 month.     Sincerely,  Reagan Camera. Alva Jewels, NP  [facility]      ADRENAL INSUFFICIENCY  WHAT YOU NEED TO KNOW  Emergency Toolkit  Adrenal insufficiency is a condition where the body is unable to produce a normal supply of the hormone cortisol, and sometimes also can't produce the hormone aldosterone.  These hormones control blood pressure and metabolism.  Symptoms of adrenal insufficiency include feeling tired, dizzy, nauseated, feverish, and having belly pain.  Some people with adrenal insufficiency may also notice salt craving and darkening of the skin.  With proper treatment, the symptoms of adrenal insufficiency will not be present very often.    Treatment of adrenal insufficiency involves taking steroid hormone pills to replace cortisol (and aldosterone if needed).  Cortisol is replaced with prednisone or hydrocortisone.  Aldosterone is replaced with fludrocortisone.     Daily Treatment:  Take your prescribed medicines for adrenal insufficiency every day.     Sick day Rules for patients on steroids:  If you have adrenal insufficiency and are sick, be aware that you will need increased doses of your glucocorticoid (steroid) medication  If you are ill, for example- fever; illnesses for which you are on antibiotics such as pneumonia, urinary tract infections; diarrhea; nausea or vomiting- then you should double or  triple the steroid dose (hydrocortisone, prednisone, or dexamethasone) while you are unwell.    Take a double dose for mild to moderate symptoms and a triple dose for severe symptoms   Contact your doctor if you are not better within 1 week. When you are better, return to your regular daily dose.   If you are unable to keep down the oral steroid medicine and keep having vomiting and diarrhea, then this is an emergency and you should go to the local ER. If your doctor has given you a prescription for the hydrocortisone injection (Solu-Cortef), then you should give yourself the injection (or have a family member give you the injection) and then go straight to the ER.   When you are sick, you should drink sugar and salt-containing liquids to help prevent dehydration    Other Precautions:  Educate family members and/or friends about your condition  Carry the medical emergency card (sent from our clinic) indicating you have adrenal insufficiency  Additionally, you can wear a medical alert bracelet or necklace indicating you have adrenal insufficiency  If you are having an outpatient procedure such as colonoscopy, barium enema, arteriography, or minor surgery, then talk to the doctors performing the procedure about the steroids. Usually, you should double up the steroid on the day of the procedure and if you are back to normal, then can go back to your regular dose the following day.    Be aware of the following signs and symptoms of acute  glucocorticoid deficiency:  Profound fatigue  Nausea/vomiting  Light headedness  Poor appetite/weight loss  Abdominal pain, loose stool    Below is a suggested list of important supplies that you may need in an emergency:  [  ]  Sufficient supply of hydrocortisone  [  ]  If you have primary adrenal insufficiency, sufficient supply of fludrocortisone   [  ]  Based on your doctor's discretion: Solu-Cortef emergency injection kit including Solu-Cortef vial, syringes, needles, alcohol swabs    [  ]  Medical emergency card  [  ]  Medical alert bracelet or necklace    For further questions/concerns, please visit:  [email or web address]  [email or web address] [id number]    Contact Information for [facility]    Your lab results, if your lab tests are done at a [place] lab, will be available through MyChart, a patient web portal to your health record at [facility].  Using MyChart, you can also request medication refills and send an electronic message to your physician if you have questions.  These messages are sent in secure fashion and are maintained as part of your health record.  MyChart is the best way to reach your physician if you have questions or issues in between office visits.     Our clinic staff can be reached by calling [phone] and our clinic fax number is [phone].     Radiology scheduling for imaging studies can be reached at [phone]       Nurse Triage Line: [phone], Monday-Friday 8:00am-4:30pm  Please call this number to report symptoms, or if you have any questions or concerns prior to your next appointment. Please have the following information available when calling: patient's name, date of birth or medical record number and provider's name.      After hours answering service: [phone]   If you have an urgent/emergent problem or have a question or concern after 5pm, on the weekend, or on a holiday, please call this number and ask the operator to page the on call Endocrinologist.     MyChart Messages  For your safety and best care, please DO NOT use MyChart messages to report symptoms. (Symptoms should be reported by calling the nurse triage line). Please use MyChart for non-urgent matters such as general questions, non-urgent prescription refills, or non-urgent scheduling  issues.   - Please do not use MyChart for messages in the evenings or on weekends, as messages are only checked during regular business hours.   - Please note that MyChart messages are routed to a central pool and one of your provider's team members will get back to you.  - Expect up to 3 or more business days for a response

## 2023-08-29 NOTE — Progress Notes (Signed)
 [facility] Follow Up Note:    Ms. Womac returns in follow up of secondary adrenal insufficiency and osteoporosis.    Last visit: 08/08/2023 w/NP    Endocrine history:  (adapted from prior notes)  Pt saw Modesto Andreas MD once and his note reports:   Chelsea Ball is a 63 y.o. woman who is referred for adrenal insufficiency.  eConsult by Dr. Randy Buttery 11/04/20 reviewed  She has followed with [facility] [name] since 2020. She has a hx of irritable bowel syndrome, diverticulitis, cholecystectomy, and was referred for abdominal pain. 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 She subsequently developed significant fatigue, and had labs checked which showed:     12/08/20 (746am) - ACTH < 5, cortisol 0.5     After these results, she was started on hydrocortisone 10/5. She feels her energy level has slightly improved, but is still continuing to feel overall fatigued and having difficulty sustaining energy throughout the day. She takes 10mg  around 10am, and then 5mg  around 1pm. We discussed more optimal timing of hydrocortisone (e.g. 8-9am and 3-4pm), as well as stress dosing and obtaining a medical alert bracelet (provided order form for the bracelet). Also discussed the diagnosis of adrenal insufficiency and importance of consistently taking hydrocortisone.      She feels her heartburn has worsened recently and she is out of Nexium. Is having difficulty figuring out the right dosing of laxative to balance her constipation/diarrhea. No current abd pain, n/v, or lightheadedness. She is going on a cruise starting at the end of January 2023.     No headaches, no hx of immunotherapy/checkpoint-inhibitor treatments.     05/2021: MRI pituitary normal  Since that visit in 03/2021 pt next saw E  Lilith Solana NP 09/2021 on [facility] 15/5. Intermittently on opiates. Had been on lower dose  (GI upset, thin stools) improved on increase from 10/5 to 15/5. Actually takes it 15 mg PO Q AM. She has been told she has UC. Does not recall sick day management or what to do if vomiting.     DEXA: (Date: 10/2022, Facility: [facility], Manufacturer: H)  Region T-score Z-score % Change (*)   Lumbar Spine -2.5 -0.9 Baseline   Femoral Neck -1.5       Total Hip -1.4          At her first visit w Dr. Geraldo Klippel- 09/2022 I asked for pt to call GI clinic that day [name] from GI to weigh in on whether tapering steroids was causing GI sxs to flare. [name] recommended increase in mesalamine briefly. In Sept [name] recommended an increase in docusate for diverticulosis. Has new GI sched for next year in January. Has upper back pain. Is edentulous. Was waiting for referral for dental implants but is no longer planning this.   10/2022: 25-D 30, Ca 9.4    At her 01/2023 visit plan was for her to take hydrocortisone 15/5 mg BID and to get Reclast. She got it 02/2023 and now reports she had nausea for over a week. Seen 04/2023 at which time she was about to see new GI doctor (Dr. [name]) and see about tapering steroids. Per his notes he felt her sxs were mainly due to IBS and she was given amitiza and psyllium. They will follow up  next month. Today she reports difficulty turning head. Released from pain management clinic clinic for this because she refused steroid injections.     08/08/2023 visit- Taking reduced dose of hydrocortisone to 12.5mg  daily, states she never took the afternoon dose- would miss it due to needing to take other meds.   Hs felt low energy for years, but has noticed no change in the past 2 weeks since dose reduction. Continues to experience "tummy troubles" every day. Saw Dr. [name] last month and has been taking amitiza and psyllium, not sure if it's helping or not.      Interval History today 08/29/2023:  Presents in person  for follow up.   Taking HC 12.5mg  daily, has not been taking the afternoon dose for many months due to feeling overwhelmed with pill burden and timing certain meds w/food. Has been feeling more nausea and more vomiting frequently over the past month since decreasing from 15/5. Denies diarrhea, has small frequent stools.   Denies feeling faint, light headed or dizzy. Endorses low energy and fatigue.   Trying to drink 3 16 oz bottles of water per day  Saw Dr. [name] yesterday and was advised to increase her omeprazole to twice daily.     No recent sick days, understands need to double dose - has sufficient supply at home        Other ongoing medical issues from the patient's PMH include   Past Medical History:   Diagnosis Date    Abdominal pain 01/21/2019    Asthma     Bipolar 1 disorder (CMS code) 01/21/2019    Depression 01/21/2019    GERD (gastroesophageal reflux disease) 05/04/2016    History of motion sickness     PONV (postoperative nausea and vomiting)        Current medications   Current Outpatient Medications   Medication Sig Dispense Refill    beclomethasone (QVAR REDIHALER) 80 mcg/actuation breath activated inhaler       calcium carbonate-vitamin D 600 mg(1,500mg ) -400 unit tablet       docusate sodium (COLACE) 100 mg capsule Take 1 capsule (100 mg total) by mouth daily 90 capsule 3    HYDROcodone-acetaminophen (NORCO) 5-325 mg tablet Take 1 tablet by mouth      hydrocortisone (CORTEF) 5 mg tablet 2.5 tabs PO Q AM and one tab PO Q PM 300 tablet 1    loratadine (CLARITIN) 10 mg tablet Take 1 tablet (10 mg total) by mouth daily      lubiprostone (AMITIZA) 24 mcg capsule Take 1 capsule (24 mcg total) by mouth in the morning and 1 capsule (24 mcg total) in the evening. Take with meals. 180 capsule 0    mesalamine (LIALDA) 1.2 gram EC tablet TAKE 1 TABLET BY MOUTH EVERY DAY 30 tablet 5    omeprazole (PRILOSEC) 20 mg capsule Take 1 capsule (20 mg total) by mouth in the morning and 1 capsule (20 mg total) in the  evening. Take before meals. 180 capsule 0    pregabalin (LYRICA) 75 mg capsule Take 1 capsule (75 mg total) by mouth daily as needed       No current facility-administered medications for this visit.       There have been no significant changes to the patient's PMH or FH.    SOC:   Social History     Socioeconomic History    Marital status: Single     Spouse name: Not on file    Number of children:  Not on file    Years of education: Not on file    Highest education level: Not on file   Occupational History    Not on file   Tobacco Use    Smoking status: Every Day     Current packs/day: 1.00     Average packs/day: 1 pack/day for 10.0 years (10.0 ttl pk-yrs)     Types: Cigarettes    Smokeless tobacco: Never    Tobacco comments:     3 cigarettes/day   Substance and Sexual Activity    Alcohol use: Yes     Alcohol/week: 4.0 - 5.0 standard drinks of alcohol     Types: 2 - 3 Glasses of wine, 2 Cans of beer per week    Drug use: Not Currently     Comment: cocaine 7 years ago    Sexual activity: Not on file   Other Topics Concern    Not on file   Social History Narrative    Not on file     Social Drivers of Health     Financial Resource Strain: Not on File (11/27/2017)    Received from [employer], [employer]     Financial Resource Strain: 0   Food Insecurity: Not on File (01/04/2023)    Received from [employer] Insecurity     Food: 0   Transportation Needs: Not on File (11/27/2017)    Received from [place], [employer] Needs     Transportation: 0   Housing Stability: Not on File (11/27/2017)    Received from [place], [employer]    Housing Stability     Housing: 0       VITAL SIGNS:   Vitals:    08/29/23 0848   BP: 110/71   BP Location: Right upper arm   Patient Position: Sitting   Cuff Size: Adult   Pulse: 98   Weight: 71.8 kg (158 lb 4.8 oz)   Height: 154.9 cm (5\' 1" )       Physical Exam  Constitutional:       Appearance: Normal appearance.   HENT:      Head: Normocephalic.      Nose: Nose normal.    Cardiovascular:      Rate and Rhythm: Normal rate.   Pulmonary:      Effort: Pulmonary effort is normal.   Neurological:      General: No focal deficit present.      Mental Status: She is alert. Mental status is at baseline.   Skin:     General: Skin is warm and dry.      Capillary Refill: Capillary refill takes less than 2 seconds.   Psychiatric:         Mood and Affect: Mood normal.         Behavior: Behavior is cooperative.         Thought Content: Thought content normal.         Judgment: Judgment normal.   Vitals reviewed.         Records Reviewed Today:  - labs: I have reviewed    Latest Reference Range & Units 12/08/20 07:46 05/20/21 07:32 05/20/21 07:34 09/23/21 07:58   ACTH, Plasma 6 - 50 pg/mL <5 (L) <5 (L) <5 (L) <5 (L)   Cortisol, serum mcg/dL 0.5 (L) <4.0 (L) <3.4 (L) <0.5 (L)   Z-Score (Female) -2.0 - 2.0 SD   -1.0    IGF-1, Adult  50 - 317 ng/mL   79    Prolactin ng/mL   9.7    Free T4 0.8 - 1.8 ng/dL   0.9 1.1   Thyroid Stimulating Hormone 0.40 - 4.50 mIU/L   5.36 (H) 2.37     - imaging: I have reviewed  BONE DENSITOMETRY REPORT 10/25/2022     L1-L4 Total  BMD values= 0.873   T-score= -2.5  Z-score= -0.9                          Total Femur  BMD values= 0.816  T-score= -1.4 Z-score= -0.6     Femur Neck  BMD values= 0.735  T-score= -1.5 Z-score= -0.4     Positioning, artifacts and morphologic findings:  Multilevel degenerative changes of the lumbar spine      Assessment & Recommendations:  Ms. Huron returns for follow up of adrenal insufficiency and osteoporosis.  08/16/2023 ACTH <5, cortisol 0.5  Currently taking reduced dose of 12.5mg  for the past 1 month. It was advised to take 12.5/5 BID, however she admits to forgetting and not taking the afternoon 5mg . Since the decrease in [facility] one month ago, she has felt increased nausea, vomiting, low energy/fatigue. Cortisol is low at 0.5, I recommend at this time add back the 5mg  afternoon dose- we discussed this at length and together we made a schedule of  her meds to guide her through out the day/timing. She will take 12.5/5mg  for the next month, monitor symptoms and notify me with any concerns. We will plan to then slowly taper over time. She verbalizes understanding of the plan.     Osteoporosis:  - no longer planning on dental implants  - #1 Reclast 02/27/2023- plan to repeat 02/2024      Follow up:  10/03/2023 Lucrezia Sachs, NP  07/2024 Dr. Fransisco Jack D. Alva Jewels, NP  [facility]   08/29/2023      APP Visit Information:   APP Service Type:  Independent  Available MD consultant:  Marcellina Sermon, MD  I attest that I have verbally informed the patient that I am an Advanced Practice Provider and explained my role in their team-based care.    I spent a total of 42 non-overlapping minutes on this patient's care on the day of their visit excluding time spent related to any billed procedures. This time includes time spent with the patient as well as time spent documenting in the medical record, reviewing patient's records and tests, obtaining history, placing orders, communicating with other healthcare professionals, counseling the patient, family, or caregiver, and/or care coordination for the diagnoses above.

## 2023-09-21 IMAGING — CT CT MAXILLOFACIAL W/ CM
3 of 6 series · 15 of 47 positions shown, 18 images · IV contrast (APPLIED)
Comparison: None Available.

CLINICAL DATA: Chemosis of conjunctiva of both eyes
(0SM-WD-CM)

Conjunctivitis of both eyes, unspecified conjunctivitis type
(0SM-WD-CM)Temperature elevation L2J.M (0SM-WD-CM)
EXAM:
CT MAXILLOFACIAL WITH CONTRAST
TECHNIQUE: Multidetector CT imaging of the maxillofacial structures was
performed with intravenous contrast. Multiplanar CT image
reconstructions were also generated.

[Series 2: maxilllofacial 2.0 hr40 3 · axial · 0.29mm/px · z∈[-226,-86]mm · 10 of 81 slices shown, 13 images]
[im 6/81  brain]
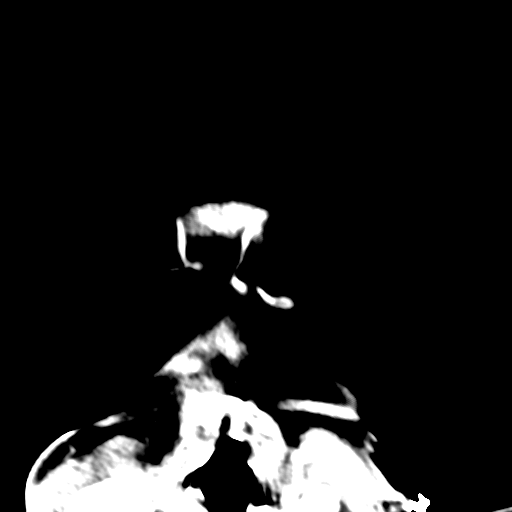
[im 6/81  bone]
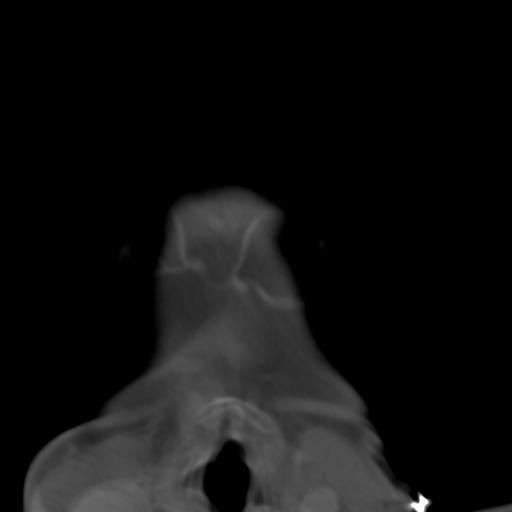
[im 12/81  bone]
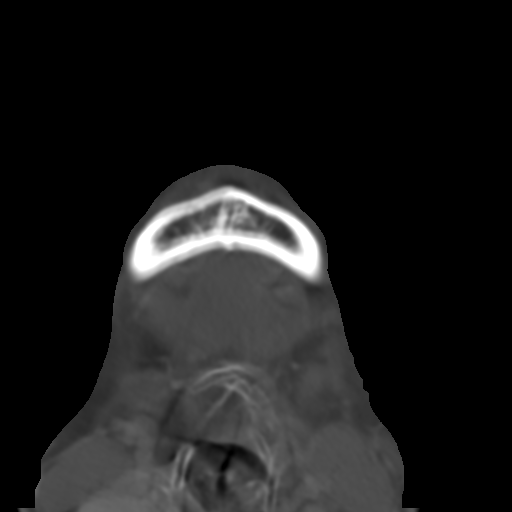
[im 23/81  bone]
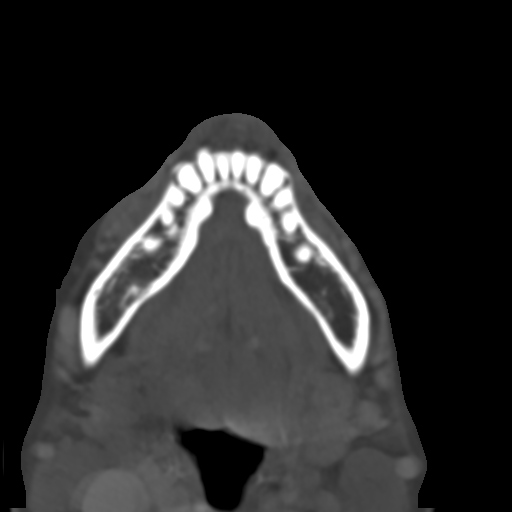
[im 29/81  bone]
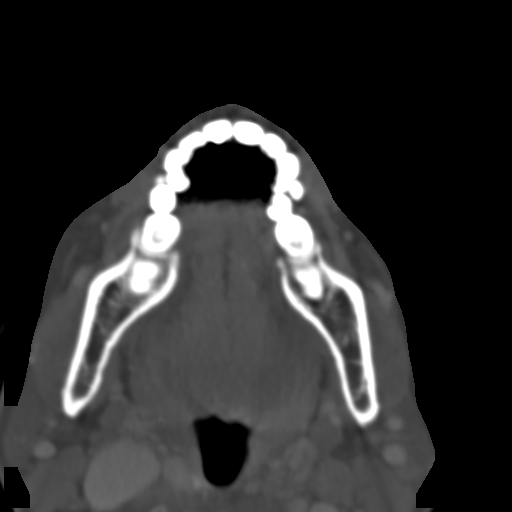
[im 35/81  brain]
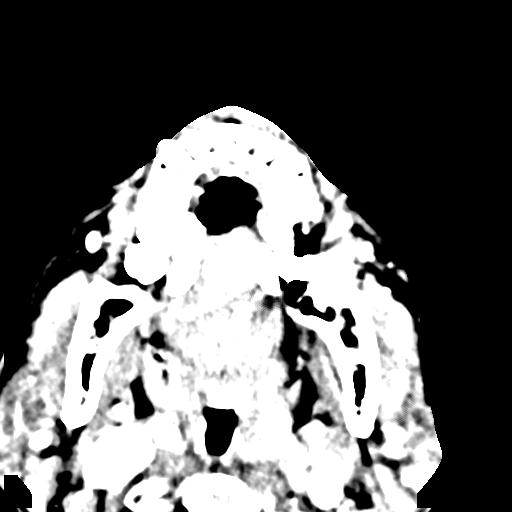
[im 35/81  bone]
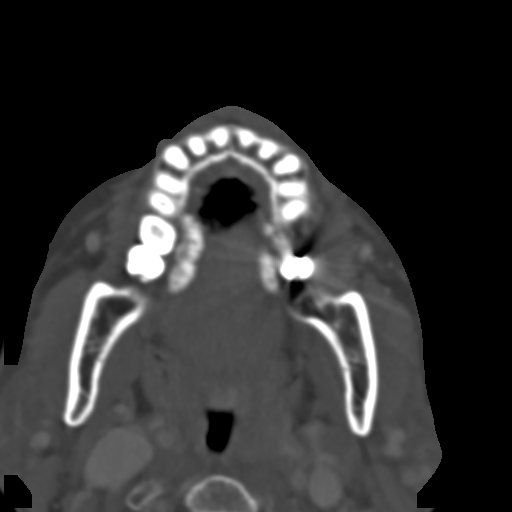
[im 46/81  bone]
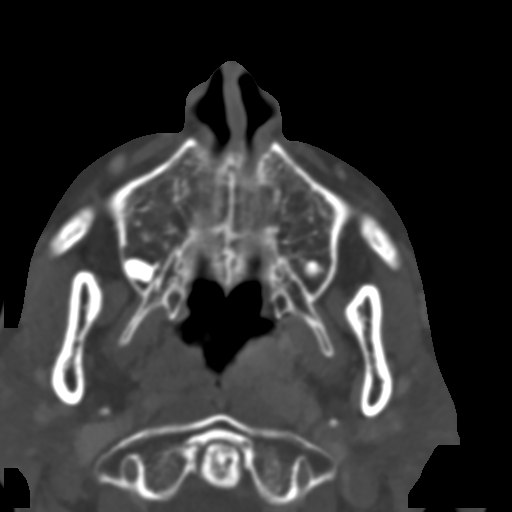
[im 52/81  bone]
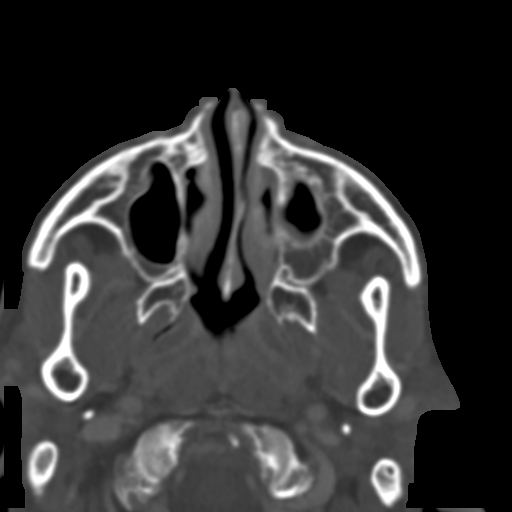
[im 58/81  bone]
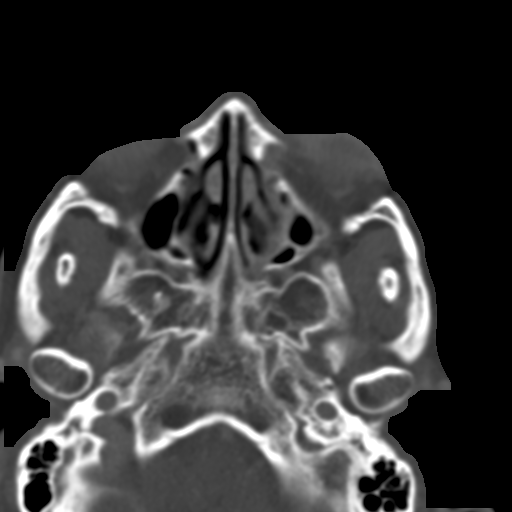
[im 69/81  brain]
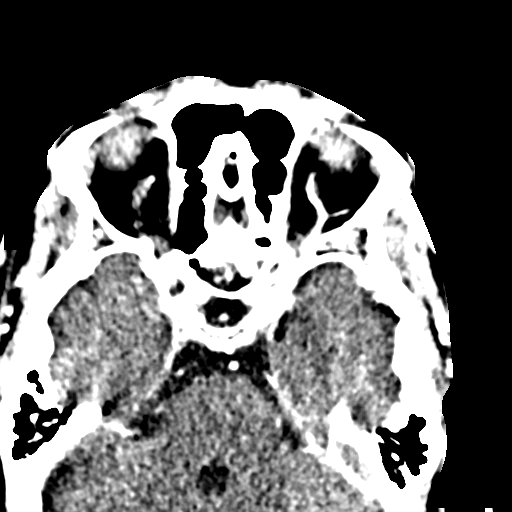
[im 69/81  bone]
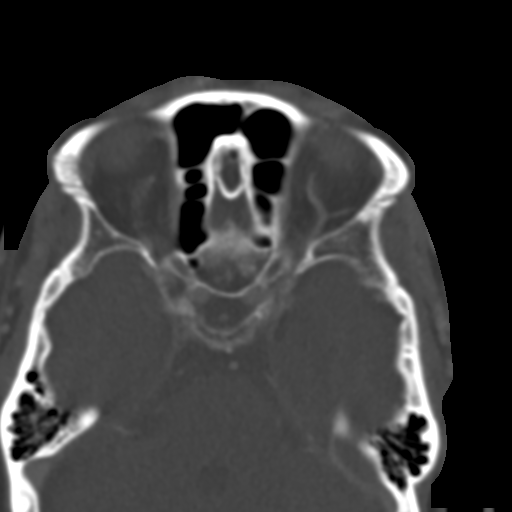
[im 75/81  bone]
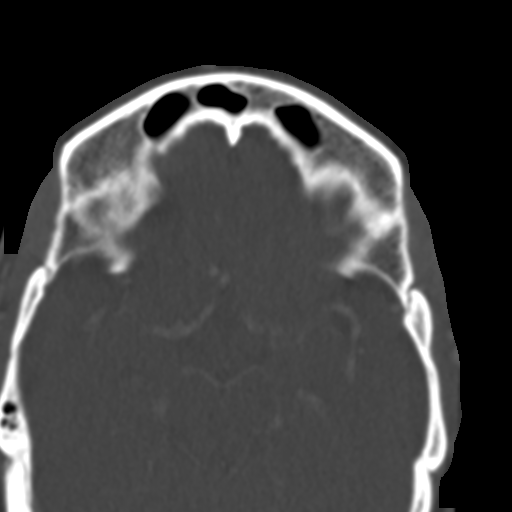

[Series 8: bone cor · coronal · 0.32mm/px · 3 of 88 slices shown]
[im 22/88  bone]
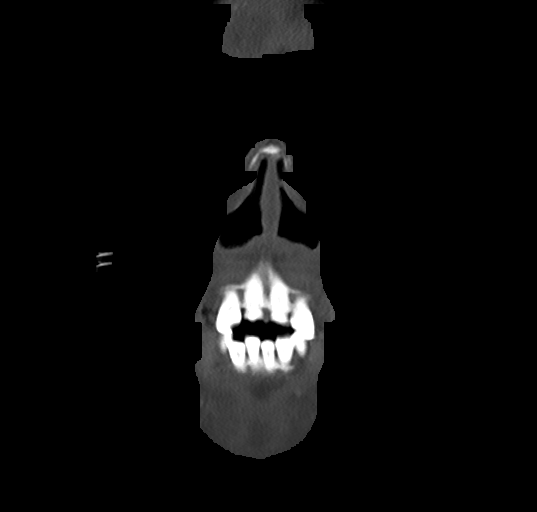
[im 44/88  bone]
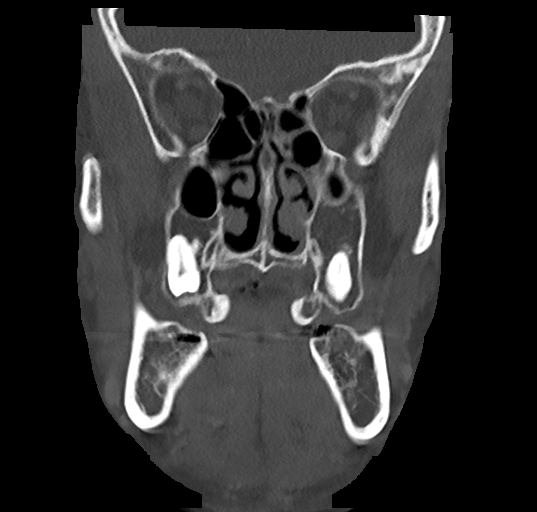
[im 66/88  bone]
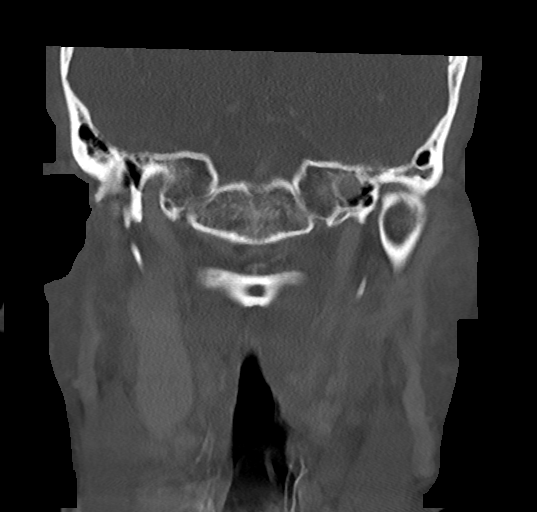

[Series 9: bone sag · sagittal · 0.31mm/px · 2 of 85 slices shown]
[im 29/85  bone]
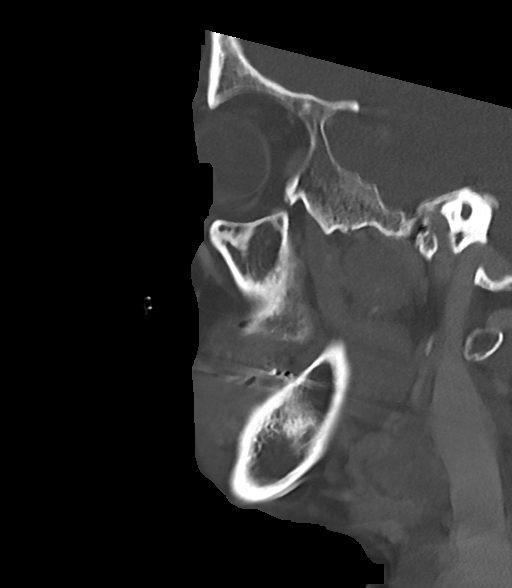
[im 57/85  bone]
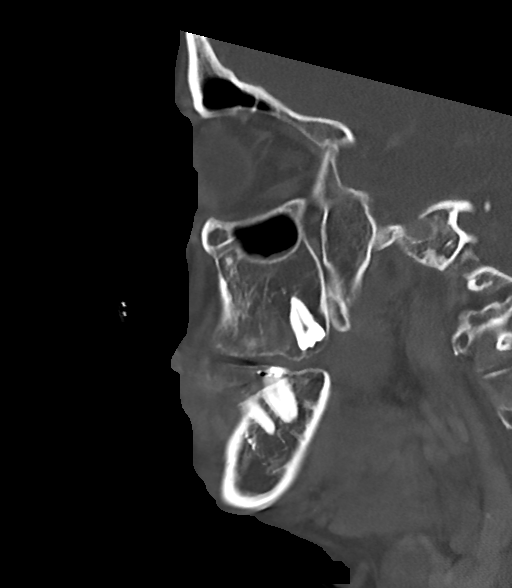

[15 of 47 positions shown; findings below may reference images not displayed]

RADIATION DOSE REDUCTION: This exam was performed according to the
departmental dose-optimization program which includes automated
exposure control, adjustment of the mA and/or kV according to
patient size and/or use of iterative reconstruction technique.

CONTRAST:  100mL OMNIPAQUE IOHEXOL 300 MG/ML  SOLN
FINDINGS: Motion limited study.  Within this limitation:

Osseous: No fracture or mandibular dislocation. No destructive
process.

Orbits: Peripherally enhancing approximately 4 x 21 x 15 mm fluid
collection abutting the anterolateral aspect of the left globe
(series 2, image 60; series 7, image 66). No postseptal edema.
Otherwise, the globes, extraocular muscles and lacrimal glands are
unremarkable.

Sinuses: Mild paranasal sinus mucosal thickening.

Soft tissues: Left preseptal findings described above. Partially
imaged borderline enlarged left upper cervical chain lymph node.

Limited intracranial: No significant or unexpected finding.
IMPRESSION: 1. Peripherally enhancing 4 x 21 x 15 mm fluid collection abutting
the anterolateral aspect of the left globe. Given the provided
clinical history, findings are concerning for conjunctivitis and
possible conjunctival abscess. Recommend correlation with direct
inspection and consideration of ophthalmology consult.
2. No evidence of postseptal involvement by CT.
3. Partially imaged borderline enlarged left upper cervical chain
lymph node, nonspecific. Recommend clinical follow-up.

## 2023-10-03 ENCOUNTER — Ambulatory Visit: Admit: 2023-10-03 | Payer: Medicaid (Managed Care) | Attending: Nurse Practitioner | Primary: Physician

## 2023-10-03 DIAGNOSIS — E2740 Unspecified adrenocortical insufficiency: Secondary | ICD-10-CM

## 2023-10-03 NOTE — Progress Notes (Signed)
 [facility] Follow Up Note:    Chelsea Ball returns in follow up of secondary adrenal insufficiency and osteoporosis.    Last visit: 08/29/2023 w/NP    Endocrine history:  (adapted from prior notes)  Pt saw [name] MD once and his note reports:   Chelsea Ball is a 63 y.o. woman who is referred for adrenal insufficiency.  eConsult by Dr. Melanee 11/04/20 reviewed  She has followed with [facility] Dr. Karleen since 2020. She has a hx of irritable bowel syndrome, diverticulitis, cholecystectomy, and was referred for abdominal pain. 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 She subsequently developed significant fatigue, and had labs checked which showed:     12/08/20 (746am) - ACTH < 5, cortisol 0.5     After these results, she was started on hydrocortisone  10/5. She feels her energy level has slightly improved, but is still continuing to feel overall fatigued and having difficulty sustaining energy throughout the day. She takes 10mg  around 10am, and then 5mg  around 1pm. We discussed more optimal timing of hydrocortisone  (e.g. 8-9am and 3-4pm), as well as stress dosing and obtaining a medical alert bracelet (provided order form for the bracelet). Also discussed the diagnosis of adrenal insufficiency and importance of consistently taking hydrocortisone .      She feels her heartburn has worsened recently and she is out of Nexium . Is having difficulty figuring out the right dosing of laxative to balance her constipation/diarrhea. No current abd pain, n/v, or lightheadedness. She is going on a cruise starting at the end of January 2023.     No headaches, no hx of immunotherapy/checkpoint-inhibitor treatments.     05/2021: MRI pituitary normal  Since that visit in 03/2021 pt next saw E  Jinan Biggins NP 09/2021 on [facility] 15/5. Intermittently on opiates. Had been on lower dose  (GI upset, thin stools) improved on increase from 10/5 to 15/5. Actually takes it 15 mg PO Q AM. She has been told she has UC. Does not recall sick day management or what to do if vomiting.     DEXA: (Date: 10/2022, Facility:  [facility], Manufacturer: H)  Region T-score Z-score % Change (*)   Lumbar Spine -2.5 -0.9 Baseline   Femoral Neck -1.5       Total Hip -1.4          At her first visit w Dr. Ina- 09/2022 I asked for pt to call GI clinic that day Dr. Karleen from GI to weigh in on whether tapering steroids was causing GI sxs to flare. Dr. Selvig recommended increase in mesalamine  briefly. In Sept Dr. Karleen recommended an increase in docusate for diverticulosis. Has new GI sched for next year in January. Has upper back pain. Is edentulous. Was waiting for referral for dental implants but is no longer planning this.   10/2022: 25-D 30, Ca 9.4    At her 01/2023 visit plan was for her to take hydrocortisone  15/5 mg BID and to get Reclast . She got it 02/2023 and now reports she had nausea for over a week. Seen 04/2023 at which time she was about to see new GI doctor (Dr. Dominique) and see about tapering steroids. Per his notes he felt her sxs were mainly due to IBS and she was given amitiza  and psyllium. They will follow up  next month. Today she reports difficulty turning head. Released from pain management clinic clinic for this because she refused steroid injections.     08/08/2023 visit- Taking reduced dose of hydrocortisone  to 12.5mg  daily, states she never took the afternoon dose- would miss it due to needing to take other meds.   Hs felt low energy for years, but has noticed no change in the past 2 weeks since dose reduction. Continues to experience "tummy troubles" every day. Saw Dr. Dominique last month and has been taking amitiza  and psyllium, not sure if it's helping or not.    08/29/2023 NP visit- Taking HC 12.5mg  daily, has not been  taking the afternoon dose for many months due to feeling overwhelmed with pill burden and timing certain meds w/food. Has been feeling more nausea and more vomiting frequently over the past month since decreasing from 15/5. Denies diarrhea, has small frequent stools.   Denies feeling faint, light headed or dizzy. Endorses low energy and fatigue.   Trying to drink 3 16 oz bottles of water  per day  Saw Dr. Dominique yesterday and was advised to increase her omeprazole  to twice daily.         Interval history today 10/03/2023:  Presents in person for follow up.   Reports feeling well today, has had intermittent headaches last Ball weeks. Saw optometrist recently who recommended eye gtts due to dry eyes.   Denies fatigue, feeling faint, abd pain/N/V, loss of appetite. Has intermittent diarrhea w/IBS- she states BMs have been improving.   Taking HC 12.5/5mg - admits to forgetting to take the afternoon dose 2-3x a week  No recent sick days, does not recall how to double dose but has not needed to recently.   Does not have medical alert bracelet- will order one      Other ongoing medical issues from the patient's PMH include   Past Medical History:   Diagnosis Date    Abdominal pain 01/21/2019    Asthma     Bipolar 1 disorder (CMS code) 01/21/2019    Depression 01/21/2019    GERD (gastroesophageal reflux disease) 05/04/2016    History of motion sickness     PONV (postoperative nausea and vomiting)        Current medications   Current Outpatient Medications   Medication Sig Dispense Refill    beclomethasone (QVAR  REDIHALER) 80 mcg/actuation breath activated inhaler       calcium  carbonate-vitamin D 600 mg(1,500mg ) -400 unit tablet       docusate sodium  (COLACE) 100 mg capsule Take 1 capsule (100 mg total) by mouth daily 90 capsule 3    HYDROcodone -acetaminophen  (NORCO) 5-325 mg tablet Take 1 tablet by mouth      hydrocortisone  (CORTEF ) 5 mg tablet 2.5 tabs PO Q AM and one tab PO Q PM 300 tablet 1    loratadine  (CLARITIN ) 10 mg  tablet Take 1 tablet (10 mg total) by mouth in the morning.      lubiprostone  (AMITIZA ) 24 mcg capsule Take 1 capsule (24 mcg total) by mouth in the morning and 1 capsule (24 mcg total) in the evening. Take with meals. 180 capsule 0    mesalamine  (LIALDA ) 1.2 gram EC tablet TAKE 1 TABLET BY MOUTH EVERY DAY 30 tablet 5    omeprazole  (PRILOSEC) 20 mg capsule Take 1 capsule (20 mg total) by mouth in the morning and 1 capsule (20 mg total) in the evening. Take before meals. 180 capsule 0    pregabalin  (LYRICA ) 75 mg  capsule Take 1 capsule (75 mg total) by mouth daily as needed       No current facility-administered medications for this visit.       There have been no significant changes to the patient's PMH or FH.    SOC:   Social History     Socioeconomic History    Marital status: Single     Spouse name: Not on file    Number of children: Not on file    Years of education: Not on file    Highest education level: Not on file   Occupational History    Not on file   Tobacco Use    Smoking status: Every Day     Current packs/day: 1.00     Average packs/day: 1 pack/day for 10.0 years (10.0 ttl pk-yrs)     Types: Cigarettes    Smokeless tobacco: Never    Tobacco comments:     3 cigarettes/day   Substance and Sexual Activity    Alcohol use: Yes     Alcohol/week: 4.0 - 5.0 standard drinks of alcohol     Types: 2 - 3 Glasses of wine, 2 Cans of beer per week    Drug use: Not Currently     Comment: cocaine 7 years ago    Sexual activity: Not on file   Other Topics Concern    Not on file   Social History Narrative    Not on file     Social Drivers of Health     Financial Resource Strain: Not on File (11/27/2017)    Received from [employer]     Financial Resource Strain: 0   Food Insecurity: Not on File (01/04/2023)    Received from [employer] Insecurity     Food: 0   Transportation Needs: Not on File (11/27/2017)    Received from [employer] Needs     Transportation: 0   Housing Stability: Not  on File (11/27/2017)    Received from [employer]     Housing: 0       VITAL SIGNS:   Vitals:    10/03/23 0918   BP: 107/70   BP Location: Left upper arm   Patient Position: Sitting   Cuff Size: Adult   Pulse: 86   Weight: 70.8 kg (156 lb 1.6 oz)   Height: 154.9 cm (5\' 1" )       Physical Exam  Constitutional:       Appearance: Normal appearance.   HENT:      Head: Normocephalic.      Nose: Nose normal.   Cardiovascular:      Rate and Rhythm: Normal rate.   Pulmonary:      Effort: Pulmonary effort is normal.   Neurological:      General: No focal deficit present.      Mental Status: She is alert. Mental status is at baseline.   Skin:     General: Skin is warm and dry.      Capillary Refill: Capillary refill takes less than 2 seconds.   Psychiatric:         Mood and Affect: Mood normal.         Behavior: Behavior is cooperative.         Thought Content: Thought content normal.         Judgment: Judgment normal.   Vitals reviewed.  Records Reviewed Today:  - labs: I have reviewed    Latest Reference Range & Units 12/08/20 07:46 05/20/21 07:32 05/20/21 07:34 09/23/21 07:58   ACTH, Plasma 6 - 50 pg/mL <5 (L) <5 (L) <5 (L) <5 (L)   Cortisol, serum mcg/dL 0.5 (L) <9.4 (L) <9.4 (L) <0.5 (L)   Z-Score (Female) -2.0 - 2.0 SD   -1.0    IGF-1, Adult 50 - 317 ng/mL   79    Prolactin ng/mL   9.7    Free T4 0.8 - 1.8 ng/dL   0.9 1.1   Thyroid Stimulating Hormone 0.40 - 4.50 mIU/L   5.36 (H) 2.37     - imaging: I have reviewed  BONE DENSITOMETRY REPORT 10/25/2022     L1-L4 Total  BMD values= 0.873   T-score= -2.5  Z-score= -0.9                          Total Femur  BMD values= 0.816  T-score= -1.4 Z-score= -0.6     Femur Neck  BMD values= 0.735  T-score= -1.5 Z-score= -0.4     Positioning, artifacts and morphologic findings:  Multilevel degenerative changes of the lumbar spine        Assessment & Recommendations:  Chelsea Ball returns for follow up of adrenal insufficiency and osteoporosis.  08/16/2023 ACTH <5,  cortisol 0.5  Currently taking reduced dose of 12.5mg  for the past 1 month. It was advised to take 12.5/5 BID, however she admits to forgetting and not taking the afternoon 5mg . Since the decrease in [facility] one month ago, she has felt increased nausea, vomiting, low energy/fatigue. Cortisol is low at 0.5, I recommend at this time add back the 5mg  afternoon dose- we discussed this at length and together we made a schedule of her meds to guide her through out the day/timing. She will take 12.5/5mg  for the next month, monitor symptoms and notify me with any concerns. We will plan to then slowly taper over time. She verbalizes understanding of the plan.       10/03/2023- Feeling well overall, has some intermittent headaches, otherwise no concerning sx of AI. Taking HC 12.5/5, although admits to forgetting to take the afternoon dose 2-3 times per week. Recommend continue current dose 12.5/5 for now and will eval for dose adjustment/lowering at next visit.   - self care for AI discussed and info printed, given in AVS  - reminded her to obtain medical alert bracelet- she states she will do so    Osteoporosis:  - no longer planning on dental implants  - #1 Reclast  02/27/2023- plan to repeat 02/2024      Follow up:  10/31/2023 Almarie Halbert Piety, NP  07/2024 Dr. Ina Almarie D. Piety, NP  [facility]   10/03/2023      APP Visit Information:   APP Service Type:  Independent  Available MD consultant:  Almarie Girard Ina, MD  I attest that I have verbally informed the patient that I am an Advanced Practice Provider and explained my role in their team-based care.    I spent a total of 35 non-overlapping minutes on this patient's care on the day of their visit excluding time spent related to any billed procedures. This time includes time spent with the patient as well as time spent documenting in the medical record, reviewing patient's records and tests, obtaining history, placing orders, communicating with  other healthcare professionals,  counseling the patient, family, or caregiver, and/or care coordination for the diagnoses above.

## 2023-10-03 NOTE — Patient Instructions (Signed)
 After Your Visit with [facility]:    Very nice to speak with you today!  Please continue your hydrocortisone  dose 12.5mg  in the AM and 5mg  in the afternoon around 3pm.     Let's plan on seeing you again in 3-4 weeks.       Sincerely,  Almarie BIRCH. Lenon, NP  [facility]      ADRENAL INSUFFICIENCY  WHAT YOU NEED TO KNOW  Emergency Toolkit  Adrenal insufficiency is a condition where the body is unable to produce a normal supply of the hormone cortisol, and sometimes also can't produce the hormone aldosterone.  These hormones control blood pressure and metabolism.  Symptoms of adrenal insufficiency include feeling tired, dizzy, nauseated, feverish, and having belly pain.  Some people with adrenal insufficiency may also notice salt craving and darkening of the skin.  With proper treatment, the symptoms of adrenal insufficiency will not be present very often.    Treatment of adrenal insufficiency involves taking steroid hormone pills to replace cortisol (and aldosterone if needed).  Cortisol is replaced with prednisone or hydrocortisone .  Aldosterone is replaced with fludrocortisone.     Daily Treatment:  Take your prescribed medicines for adrenal insufficiency every day.     Sick day Rules for patients on steroids:  If you have adrenal insufficiency and are sick, be aware that you will need increased doses of your glucocorticoid (steroid) medication  If you are ill, for example- fever; illnesses for which you are on antibiotics such as pneumonia, urinary tract infections; diarrhea; nausea or vomiting- then you should double or triple the steroid dose (hydrocortisone , prednisone, or dexamethasone) while you are unwell.    Take a double dose for mild to moderate symptoms and a triple dose for severe symptoms   Contact your doctor if you are not better within 1 week. When you are better, return to your regular daily dose.   If you are unable to keep down the oral steroid medicine and keep having  vomiting and diarrhea, then this is an emergency and you should go to the local ER. If your doctor has given you a prescription for the hydrocortisone  injection (Solu-Cortef ), then you should give yourself the injection (or have a family member give you the injection) and then go straight to the ER.   When you are sick, you should drink sugar and salt-containing liquids to help prevent dehydration    Other Precautions:  Educate family members and/or friends about your condition  Carry the medical emergency card (sent from our clinic) indicating you have adrenal insufficiency  Additionally, you can wear a medical alert bracelet or necklace indicating you have adrenal insufficiency  If you are having an outpatient procedure such as colonoscopy, barium enema, arteriography, or minor surgery, then talk to the doctors performing the procedure about the steroids. Usually, you should double up the steroid on the day of the procedure and if you are back to normal, then can go back to your regular dose the following day.    Be aware of the following signs and symptoms of acute glucocorticoid deficiency:  Profound fatigue  Nausea/vomiting  Light headedness  Poor appetite/weight loss  Abdominal pain, loose stool    Below is a suggested list of important supplies that you may need in an emergency:  [  ]  Sufficient supply of hydrocortisone   [  ]  If you have primary adrenal insufficiency, sufficient supply of fludrocortisone   [  ]  Based on your doctor's discretion:  Solu-Cortef  emergency injection kit including Solu-Cortef  vial, syringes, needles, alcohol swabs   [  ]  Medical emergency card  [  ]  Medical alert bracelet or necklace    For further questions/concerns, please  visit:  [email or web address]  [email or web address] [id number]    Contact Information for [facility]    Your lab results, if your lab tests are done at a [facility] lab, will be available through MyChart, a patient web portal to your health record at [facility].  Using MyChart, you can also request medication refills and send an electronic message to your physician if you have questions.  These messages are sent in secure fashion and are maintained as part of your health record.  MyChart is the best way to reach your physician if you have questions or issues in between office visits.     Our clinic staff can be reached by calling [phone] and our clinic fax number is [phone].     Radiology scheduling for imaging studies can be reached at [phone]       Nurse Triage Line: [phone], Monday-Friday 8:00am-4:30pm  Please call this number to report symptoms, or if you have any questions or concerns prior to your next appointment. Please have the following information available when calling: patient's name, date of birth or medical record number and provider's name.      After hours answering service: [phone]   If you have an urgent/emergent problem or have a question or concern after 5pm, on the weekend, or on a holiday, please call this number and ask the operator to page the on call Endocrinologist.     MyChart Messages  For your safety and best care, please DO NOT use MyChart messages to report symptoms. (Symptoms should be reported by calling the nurse triage line). Please use MyChart for non-urgent matters such as general questions, non-urgent prescription refills, or non-urgent scheduling issues.   - Please do not use MyChart for messages in the evenings or on weekends, as messages are only checked  during regular business hours.   - Please note that MyChart messages are routed to a central pool and one of your provider's team members will get back to you.  - Expect up to 3 or more business days for a response

## 2023-10-08 ENCOUNTER — Ambulatory Visit (LOCAL_COMMUNITY_HEALTH_CENTER): Payer: Self-pay

## 2023-10-08 DIAGNOSIS — Z111 Encounter for screening for respiratory tuberculosis: Secondary | ICD-10-CM

## 2023-10-10 ENCOUNTER — Ambulatory Visit: Payer: Self-pay

## 2023-10-10 DIAGNOSIS — Z111 Encounter for screening for respiratory tuberculosis: Secondary | ICD-10-CM

## 2023-10-10 LAB — TB SKIN TEST
Induration: 0 mm
TB Skin Test: NEGATIVE

## 2023-10-10 NOTE — Progress Notes (Signed)
 PPD Reading Note PPD read and results entered in EpicCare. Result: 0 mm induration. Interpretation: Negative Allergic reaction: no  Patient given results for records. Kandi KATHEE Glatter, RN

## 2023-10-31 ENCOUNTER — Ambulatory Visit
Admit: 2023-10-31 | Discharge: 2023-10-31 | Payer: Medicaid (Managed Care) | Attending: Nurse Practitioner | Primary: Physician

## 2023-10-31 DIAGNOSIS — E2740 Unspecified adrenocortical insufficiency: Secondary | ICD-10-CM

## 2023-10-31 NOTE — Patient Instructions (Signed)
 After Your Visit with [facility]:    Very nice to speak with you today!  Please lower your hydrocortisone  dose: take 10mg  every morning, and take 5mg  every afternoon.     Let's plan on seeing you again in 1 months.   Please have your labs drawn 1 week beforehand so we can review the results together. Make sure to hold the hydrocortisone  the morning of and afternoon before your labs. Get the labs done in the morning, FASTING.  Labs have been ordered @ [facility]         Sincerely,  Almarie BIRCH. Lenon, NP  [facility]      ADRENAL INSUFFICIENCY  WHAT YOU NEED TO KNOW  Emergency Toolkit  Adrenal insufficiency is a condition where the body is unable to produce a normal supply of the hormone cortisol, and sometimes also can't produce the hormone aldosterone.  These hormones control blood pressure and metabolism.  Symptoms of adrenal insufficiency include feeling tired, dizzy, nauseated, feverish, and having belly pain.  Some people with adrenal insufficiency may also notice salt craving and darkening of the skin.  With proper treatment, the symptoms of adrenal insufficiency will not be present very often.    Treatment of adrenal insufficiency involves taking steroid hormone pills to replace cortisol (and aldosterone if needed).  Cortisol is replaced with prednisone or hydrocortisone .  Aldosterone is replaced with fludrocortisone.     Daily Treatment:  Take your prescribed medicines for adrenal insufficiency every day.     Sick day Rules for patients on steroids:  If you have adrenal insufficiency and are sick, be aware that you will need increased doses of your glucocorticoid (steroid) medication  If you are ill, for example- fever; illnesses for which you are on antibiotics such as pneumonia, urinary tract infections; diarrhea; nausea or vomiting- then you should double or triple the steroid dose (hydrocortisone , prednisone, or dexamethasone) while you are unwell.    Take a double dose for mild to  moderate symptoms and a triple dose for severe symptoms   Contact your doctor if you are not better within 1 week. When you are better, return to your regular daily dose.   If you are unable to keep down the oral steroid medicine and keep having vomiting and diarrhea, then this is an emergency and you should go to the local ER. If your doctor has given you a prescription for the hydrocortisone  injection (Solu-Cortef ), then you should give yourself the injection (or have a family member give you the injection) and then go straight to the ER.   When you are sick, you should drink sugar and salt-containing liquids to help prevent dehydration    Other Precautions:  Educate family members and/or friends about your condition  Carry the medical emergency card (sent from our clinic) indicating you have adrenal insufficiency  Additionally, you can wear a medical alert bracelet or necklace indicating you have adrenal insufficiency  If you are having an outpatient procedure such as colonoscopy, barium enema, arteriography, or minor surgery, then talk to the doctors performing the procedure about the steroids. Usually, you should double up the steroid on the day of the procedure and if you are back to normal, then can go back to your regular dose the following day.    Be aware of the following signs and symptoms of acute glucocorticoid deficiency:  Profound fatigue  Nausea/vomiting  Light headedness  Poor appetite/weight loss  Abdominal pain, loose stool    Below is a suggested  list of important supplies that you may need in an emergency:  [  ]  Sufficient supply of hydrocortisone   [  ]  If you have primary adrenal insufficiency, sufficient supply of fludrocortisone   [  ]  Based on your doctor's discretion: Solu-Cortef  emergency injection kit including Solu-Cortef  vial, syringes, needles, alcohol swabs   [  ]  Medical emergency card  [  ]  Medical alert bracelet or necklace    For further questions/concerns, please  visit:  [email or web address]  [email or web address] [id number]    Contact Information for [facility]    Your lab results, if your lab tests are done at a [facility] lab, will be available through MyChart, a patient web portal to your health record at [facility].  Using MyChart, you can also request medication refills and send an electronic message to your physician if you have questions.  These messages are sent in secure fashion and are maintained as part of your health record.  MyChart is the best way to reach your physician if you have questions or issues in between office visits.     Our clinic staff can be reached by calling [phone] and our clinic fax number is [phone].     Radiology scheduling for imaging studies can be reached at [phone]       Nurse Triage Line: [phone], Monday-Friday 8:00am-4:30pm  Please call this number to report symptoms, or if you have any questions or concerns prior to your next appointment. Please have the following information available when calling: patient's name, date of birth or medical record number and provider's name.      After hours answering service: [phone]   If you have an urgent/emergent problem or have a question or concern after 5pm, on the weekend, or on a holiday, please call this number and ask the operator to page the on call Endocrinologist.     MyChart Messages  For your safety and best care, please DO NOT use MyChart messages to report symptoms. (Symptoms should be reported by calling the nurse triage line). Please use MyChart for non-urgent matters such as general questions, non-urgent prescription refills, or non-urgent scheduling issues.   - Please do not use MyChart for messages in the evenings or on weekends, as messages are only checked  during regular business hours.   - Please note that MyChart messages are routed to a central pool and one of your provider's team members will get back to you.  - Expect up to 3 or more business days for a response

## 2023-10-31 NOTE — Progress Notes (Signed)
 [facility] Follow Up Note:    Chelsea Ball returns in follow up of secondary adrenal insufficiency and osteoporosis.    Last visit: 10/03/2023 w/NP    Endocrine history:  (adapted from prior notes)  Pt saw Chelsea Queen MD once and his note reports:   Chelsea Ball is a 63 y.o. woman who is referred for adrenal insufficiency.  eConsult by Dr. Melanee 11/04/20 reviewed  She has followed with [facility] Dr. Karleen since 2020. She has a hx of irritable bowel syndrome, diverticulitis, cholecystectomy, and was referred for abdominal pain. 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 She subsequently developed significant fatigue, and had labs checked which showed:     12/08/20 (746am) - ACTH < 5, cortisol 0.5     After these results, she was started on hydrocortisone  10/5. She feels her energy level has slightly improved, but is still continuing to feel overall fatigued and having difficulty sustaining energy throughout the day. She takes 10mg  around 10am, and then 5mg  around 1pm. We discussed more optimal timing of hydrocortisone  (e.g. 8-9am and 3-4pm), as well as stress dosing and obtaining a medical alert bracelet (provided order form for the bracelet). Also discussed the diagnosis of adrenal insufficiency and importance of consistently taking hydrocortisone .      She feels her heartburn has worsened recently and she is out of Nexium . Is having difficulty figuring out the right dosing of laxative to balance her constipation/diarrhea. No current abd pain, n/v, or lightheadedness. She is going on a cruise starting at the end of January 2023.     No headaches, no hx of immunotherapy/checkpoint-inhibitor treatments.     05/2021: MRI pituitary normal  Since that visit in 03/2021 pt next saw E  Gearl Baratta NP 09/2021 on [facility] 15/5. Intermittently on opiates. Had been on lower dose  (GI upset, thin stools) improved on increase from 10/5 to 15/5. Actually takes it 15 mg PO Q AM. She has been told she has UC. Does not recall sick day management or what to do if vomiting.     DEXA: (Date: 10/2022, Facility: [facility], Manufacturer: H)  Region T-score Z-score % Change (*)   Lumbar Spine -2.5 -0.9 Baseline   Femoral Neck -1.5       Total Hip -1.4          At her first visit w Dr. Ina- 09/2022 I asked for pt to call GI clinic that day Dr. Karleen from GI to weigh in on whether tapering steroids was causing GI sxs to flare. Dr. Selvig recommended increase in mesalamine  briefly. In Sept Dr. Karleen recommended an increase in docusate for diverticulosis. Has new GI sched for next year in January. Has upper back pain. Is edentulous. Was waiting for referral for dental implants but is no longer planning this.   10/2022: 25-D 30, Ca 9.4    At her 01/2023 visit plan was for her to take hydrocortisone  15/5 mg BID and to get Reclast . She got it 02/2023 and now reports she had nausea for over a week. Seen 04/2023 at which time she was about to see new GI doctor (Dr. Dominique) and see about tapering steroids. Per his notes he felt her sxs were mainly due to IBS and she was given amitiza  and psyllium. They will follow up  next month. Today she reports difficulty turning head. Released from pain management clinic clinic for this because she refused steroid injections.     08/08/2023 visit- Taking reduced dose of hydrocortisone  to 12.5mg  daily, states she never took the afternoon dose- would miss it due to needing to take other meds.   Hs felt low energy for years, but has noticed no change in the past 2 weeks since dose reduction. Continues to experience "tummy troubles" every day. Saw Dr. Dominique last month and has been taking amitiza  and psyllium, not sure if it's helping or not.    08/29/2023 NP visit- Taking HC 12.5mg  daily, has not been  taking the afternoon dose for many months due to feeling overwhelmed with pill burden and timing certain meds w/food. Has been feeling more nausea and more vomiting frequently over the past month since decreasing from 15/5. Denies diarrhea, has small frequent stools.   Denies feeling faint, light headed or dizzy. Endorses low energy and fatigue.   Trying to drink 3 16 oz bottles of water  per day  Saw Dr. Dominique yesterday and was advised to increase her omeprazole  to twice daily.     10/03/2023- Taking HC 12.5/5mg - admits to forgetting to take the afternoon dose 2-3x a week      Interval history today 10/31/2023:  Presents in person for follow up.   Taking HC 12.5mg   every AM and 5mg  every afternoon. Denies any worsening fatigue, feeling faint, N/V.  Reports feeling well today, reports intermittent diarrhea and incomplete bms.     No recent sick days, does not recall how to double dose but has not needed to recently.   Does not have medical alert bracelet- will order one      Other ongoing medical issues from the patient's PMH include   Past Medical History:   Diagnosis Date    Abdominal pain 01/21/2019    Asthma     Bipolar 1 disorder (CMS code) 01/21/2019    Depression 01/21/2019    GERD (gastroesophageal reflux disease) 05/04/2016    History of motion sickness     PONV (postoperative nausea and vomiting)        Current medications   Current Outpatient Medications   Medication Sig Dispense Refill    beclomethasone (QVAR  REDIHALER) 80 mcg/actuation breath activated inhaler       calcium  carbonate-vitamin D 600 mg(1,500mg ) -400 unit tablet       docusate sodium  (COLACE) 100 mg capsule Take 1 capsule (100 mg total) by mouth daily 90 capsule 3    HYDROcodone -acetaminophen  (NORCO) 5-325 mg tablet Take 1 tablet by mouth      hydrocortisone  (CORTEF ) 5 mg tablet 2.5 tabs PO Q AM and one tab PO Q PM 300 tablet 1    loratadine  (CLARITIN ) 10 mg tablet Take 1 tablet (10 mg total) by mouth in the morning.      lubiprostone   (AMITIZA ) 24 mcg capsule Take 1 capsule (24 mcg total) by mouth in the morning and 1 capsule (24 mcg total) in the evening. Take with meals. 180 capsule 0    mesalamine  (LIALDA ) 1.2 gram EC tablet TAKE 1 TABLET BY MOUTH EVERY DAY 30 tablet 5    omeprazole  (PRILOSEC) 20 mg capsule Take 1 capsule (20 mg total) by mouth in the morning and 1 capsule (20 mg total) in the evening. Take before meals. 180 capsule 0    pregabalin  (LYRICA ) 75 mg capsule Take 1 capsule (75 mg total) by mouth daily as needed  No current facility-administered medications for this visit.       There have been no significant changes to the patient's PMH or FH.    SOC:   Social History     Socioeconomic History    Marital status: Single     Spouse name: Not on file    Number of children: Not on file    Years of education: Not on file    Highest education level: Not on file   Occupational History    Not on file   Tobacco Use    Smoking status: Every Day     Current packs/day: 1.00     Average packs/day: 1 pack/day for 10.0 years (10.0 ttl pk-yrs)     Types: Cigarettes    Smokeless tobacco: Never    Tobacco comments:     3 cigarettes/day   Substance and Sexual Activity    Alcohol use: Yes     Alcohol/week: 4.0 - 5.0 standard drinks of alcohol     Types: 2 - 3 Glasses of wine, 2 Cans of beer per week    Drug use: Not Currently     Comment: cocaine 7 years ago    Sexual activity: Not on file   Other Topics Concern    Not on file   Social History Narrative    Not on file     Social Drivers of Health     Financial Resource Strain: Not on File (11/27/2017)    Received from [employer]     Financial Resource Strain: 0   Food Insecurity: Not on File (01/04/2023)    Received from [employer] [facility]     Food: 0   Transportation Needs: Not on File (11/27/2017)    Received from [employer] [facility]     Transportation: 0   Housing Stability: Not on File (11/27/2017)    Received from [employer]     Housing: 0        VITAL SIGNS:   There were no vitals filed for this visit.      Physical Exam  Constitutional:       Appearance: Normal appearance.   HENT:      Head: Normocephalic.      Nose: Nose normal.   Cardiovascular:      Rate and Rhythm: Normal rate.   Pulmonary:      Effort: Pulmonary effort is normal.   Neurological:      General: No focal deficit present.      Mental Status: She is alert. Mental status is at baseline.   Skin:     General: Skin is warm and dry.      Capillary Refill: Capillary refill takes less than 2 seconds.   Psychiatric:         Mood and Affect: Mood normal.         Behavior: Behavior is cooperative.         Thought Content: Thought content normal.         Judgment: Judgment normal.   Vitals reviewed.         Records Reviewed Today:  - labs: I have reviewed    Latest Reference Range & Units 12/08/20 07:46 05/20/21 07:32 05/20/21 07:34 09/23/21 07:58   ACTH, Plasma 6 - 50 pg/mL <5 (L) <5 (L) <5 (L) <5 (L)   Cortisol, serum mcg/dL 0.5 (L) <9.4 (L) <9.4 (L) <0.5 (  L)   Z-Score (Female) -2.0 - 2.0 SD   -1.0    IGF-1, Adult 50 - 317 ng/mL   79    Prolactin ng/mL   9.7    Free T4 0.8 - 1.8 ng/dL   0.9 1.1   Thyroid Stimulating Hormone 0.40 - 4.50 mIU/L   5.36 (H) 2.37     - imaging: I have reviewed  BONE DENSITOMETRY REPORT 10/25/2022     L1-L4 Total  BMD values= 0.873   T-score= -2.5  Z-score= -0.9                          Total Femur  BMD values= 0.816  T-score= -1.4 Z-score= -0.6     Femur Neck  BMD values= 0.735  T-score= -1.5 Z-score= -0.4     Positioning, artifacts and morphologic findings:  Multilevel degenerative changes of the lumbar spine        Assessment & Recommendations:  Chelsea Ball returns for follow up of adrenal insufficiency and osteoporosis.  08/16/2023 ACTH <5, cortisol 0.5. Taking reduced dose of 12.5mg  for the past 1 month. It was advised to take 12.5/5 BID, however she admits to forgetting and not taking the afternoon 5mg . Since the decrease in [facility] one month ago, she has felt  increased nausea, vomiting, low energy/fatigue. Cortisol is low at 0.5, I recommend at this time add back the 5mg  afternoon dose- we discussed this at length and together we made a schedule of her meds to guide her through out the day/timing. She will take 12.5/5mg  for the next month, monitor symptoms and notify me with any concerns. We will plan to then slowly taper over time. She verbalizes understanding of the plan.     10/03/2023- Feeling well overall, has some intermittent headaches, otherwise no concerning sx of AI.   Taking HC 12.5/5, although admits to forgetting to take the afternoon dose 2-3 times per week. Recommend continue current dose 12.5/5 for now and will eval for dose adjustment/lowering at next visit.   - self care for AI discussed and info printed, given in AVS  - reminded her to obtain medical alert bracelet- she states she will do so      10/31/2023 Taking HC 12.5/5 and overall no concerning sx for AI.   Recommend trial of lowering HC to 10/5.  Repeat cortisol/ATCH in 3 weeks.  Sick day/AI self care reviewed today. She verbalizes understanding.     Osteoporosis:  - no longer planning on dental implants  - #1 Reclast  02/27/2023- plan to repeat 02/2024      Follow up:  12/04/2023 Almarie Halbert Piety, NP w/cortisol/ACTH  07/2024 Dr. Ina Almarie D. Piety, NP  [facility]   10/31/2023      APP Visit Information:   APP Service Type:  Independent  Available MD consultant:  Almarie Girard Ina, MD  I attest that I have verbally informed the patient that I am an Advanced Practice Provider and explained my role in their team-based care.    I spent a total of 35 non-overlapping minutes on this patient's care on the day of their visit excluding time spent related to any billed procedures. This time includes time spent with the patient as well as time spent documenting in the medical record, reviewing patient's records and tests, obtaining history, placing orders, communicating with  other healthcare professionals, counseling the patient, family, or caregiver, and/or care coordination for  the diagnoses above.

## 2023-11-28 MED ORDER — OMEPRAZOLE 20 MG CAPSULE,DELAYED RELEASE
20 | ORAL_CAPSULE | ORAL | 0 refills | 30.00000 days | Status: DC
Start: 2023-11-28 — End: 2024-03-05

## 2023-11-29 NOTE — Telephone Encounter (Signed)
 Patient/Caregiver calling for the following reason: Patient called and advised they have not yet completed their lab order and plan to do so in early September. Patient cancelled the follow-up appointment with NP Vertell scheduled for 08/26 at 10:00 AM. Patient requested to reschedule but declined the offered PM appointments, stating they are only available for AM slots, no later than 10:30 AM. Patient is requesting a call back.    Preferred mode of communication is Phone number on file.  Preferred phone number:  [phone]    [name] to leave message Yes.    Last appointment: Department has no specialty    Next appointment: Visit date not found    Chelsea Ball  [facility]

## 2023-12-19 ENCOUNTER — Telehealth
Admit: 2023-12-19 | Discharge: 2023-12-19 | Payer: Medicaid (Managed Care) | Attending: Nurse Practitioner | Primary: Physician

## 2023-12-19 DIAGNOSIS — K581 Irritable bowel syndrome with constipation: Secondary | ICD-10-CM

## 2023-12-19 LAB — CORTISOL, A.M.: Cortisol, serum: 0.5 ug/dL — ABNORMAL LOW

## 2023-12-19 LAB — ADRENOCORTICOTROPIC HORMONE: ACTH, Plasma: <5 pg/mL — ABNORMAL LOW (ref 6–50)

## 2023-12-19 MED ORDER — LUBIPROSTONE 24 MCG CAPSULE
24 | ORAL | Status: AC
Start: 2023-12-19 — End: ?

## 2023-12-19 NOTE — Progress Notes (Addendum)
 APP Visit Information:   APP Service Type:  Independent  Available MD consultant:  Loyal Meter, MD  I attest that I have verbally informed the patient that I am an Advanced Practice Provider and explained my role in their team-based care.    I spent a total of 70 non-overlapping minutes on this patient's care on the day of their visit excluding time spent related to any billed procedures. This time includes time spent with the patient as well as time spent documenting in the medical record, reviewing patient's records and tests, obtaining history, placing orders, communicating with other healthcare professionals, counseling the patient, family, or caregiver, and/or care coordination for the diagnoses above.    I performed this evaluation using real-time telehealth tools, including a live video Zoom connection between my location and the patient's location. Prior to initiating, the patient consented to perform this evaluation using telehealth tools.    Subjective    Chelsea Ball is a 63 y.o. female who presents with the following:             History of Present Illness     12/19/2023 ETTER Moats ): This is a follow up visit. Patient reports in follow up having ongoing lower abdominal pain, which resolves following bowel movement. Denies fever, nausea, or vomitting, chills.  She is having 1-5 bowel movements a day. She associated her bowel habits to be like small blobs ( [place] 4-5). She also reports significant straining with bowel movements and incomplete emptying requiring multiple times to the bathroom. Requires some body manipulation with bending forward/ legs up. Denies rectal stimulation. Current bowel regimen includes: Amitiza  24 mcg twice daily w/ meals.     Also notes acid reflux, though controlled with omeprazole  20 mg twice daily, recently diagnosed with osteoporosis ( managed by [facility]) will be due for repeat biphosphates infusion in December.     Was recommended by PCP to start amitriptyline 25 mg  for sleep,  but has not started yet. Hasn't had a DRE. Has some discharge from her rectum which smells like fish. Has been given a prescription for Bismuth  in the past. Prior reaction/ intolerance to budesonide  oral tablet leading to Adrenal insuffiencey. Would prefer to avoid stool testing at this time. Has not met with Pelvic physiotherapy.    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         Previous Clinic Visits    08/28/23 Christina):  This is follow up visit. In interim, had normal CTAP 05/2023. She started Amitiza  once daily in addition to continuing mesalamine . She has 2-3 BM/d without bleeding but still has mild abdominal discomfort. Unclear why didn't take twice daily Amatiza but would like new Rx. Hasn't received omeprazole  but GERD is stable. Overall feels ok but thinks stools are small which leads to more frequent trips to bathroom.     04/26/23 Christina):  This is initial visit with me, previously followed with Dr. Karleen. In interim, continues to have abdominal pain and constipation. Notes having 1-2 days per where she feels well without pain or constipation but the other days she has bilateral lower abdominal pain going to her back worsened with constipation. She notes she has to rock on the commode or distort her self to pass a bowel movement. Takes a stool softener but no other laxatives. Currently taking mesalamine  2.4g/d per day which was changed from 1.2g/d about 1 year ago. Denies having blood in the stool. Lastly has acid reflux at times but is  not taking omeprazole  regularly. No other acute complaints.    07/05/22 Justin):  "This is a 63 y.o. female with history of irritable bowel syndrome with constipation, diverticulitis, cholecystectomy (2019), depression, substance use and hysterectomy who is referred to gastroenterology for diverticulosis with abdominal pain and incomplete colonoscopy, following up after colonoscopy was able to be completed.      Background history:  Referred  from Dr. Elgin Endo in [place]. 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 When she eats she has to eat some fruit to have a bowel movement and it can be runny. The patient reports weight loss because she gets severe stomach pain when she eats. The pain is happening on a daily basis. The entire stomach is painful, not one particular area- she feels she has to pant (breathe heavily) which can calm the pain a little bit. She reports that she has been put on bed rest by Dr. Endo because of this. She has had weight loss related to eating less, she was 142 pounds, now down to 115 pounds. The only thing she can eat is soft foods, and reports she's not able to eat much. If she eats more she gets severe pain. She has some nausea but no vomiting. She reports being very weak now. She is also having rotator cuff problem. She reports having bowel movements that are runny - they used to be small, solid, curved, but now have transitioned to being runny. Some days she does not have a bowel movement, and some days she will have a few runny bowel movements. She has some fear of eating. There is no blood in the stools.      She has not seen a nutritionist, does not take any nutrition shakes. She has stopped taking any of her medications because of the abdominal pain, uses Norco maybe once a week for rotator cuff, occasionally takes Calcium .     The patient reads me her medication list, although she is not curently taking these: Mirtazapine 15, megestrol 15, 25mg  hydroxyzine , 6mg  tizanadine PRN, calcium  +vitamin D, docusate, fluoxetine , lamotrigine , albuterol , QVAR , Norco 5. She takes esomeprazole  40mg  as needed for heartburn. She has tried FiberChoice tablets, didn't help her. Prior medicines from Dr. Endo gave her  diarrhea. She tried miralax . She takes lactulose sometimes when constipated, helps her go. No enemas. Her prior medication list from June 2020 note of Dr. Endo also mentions several PPI's, as well as metamucil, Creon, Amitiza , Bentyl, despiramine, Donnatal, fleet's enemas, but she reports prior medicines prescribed by Dr. Endo just gave her diarrhea so she's not on them now.      She was seen in June 2020 by her gastroenterologist Dr. Endo who noted that he had attempted colonoscopy but was unable to advance past an area.      Her cholecystectomy did not help her pain in 2019.      Note, utox has been positive for cocaine metabolites on 05/29/18, 12/25/16, ad 01/25/16.      02/28/19 phone visit:  At my initial clinic visit on 01/21/19, I planned to repeat colonoscopy with anesthesia. 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 I started the patient on mesalamine  2.4g daily which  she did not tolerate after it causing sour stomach for a week.      At first she was having small bowel movements with postprandial abdominal pain. She is still having thin stools and postprandial discomfort that begins even right when she begins to eat a meal that has caused some fear of eating larger meals. At our 02/28/19 visit she is a little better but not much. Now she reports walking more after being off bed rest and is having still thin bowel movements. She reports still not eating very well but thinks she has not lost further weight. She has not filled other medications with her PCP at this point. She is back on her psych meds. She takes opioids but only rarely, not every day.      05/21/19 visit:  At our 04/29/18 video visit, she was given a course of rifaximin  with some improvement initially but symptoms returned. She was prescribed a course of Uceris  but was not able to get  this due to insurance for a couple of months.      At our 05/21/19 visit, she reports symptoms are getting worse. She has frequent small volume diarrhea, sometimes with urges to have a bowel movement but nothing comes out. When she does have bowel movments, it tends to be small bits or runny. She has had some episodes of incontinence. Some days she goes to the bathroom every 20 or 30 minutes and bowel movements are still very thin- bowel movements are like very long worms if it's not watery. Her abdominal pain is severe- reports it is worse than labor pains, intermittent spasms affecting the whole abdomen - she has to lay still and focus on her breathing to get the pain improve. It involves the whole abdomen and tends to happen when she is on the toilet trying to have a bowel movement. She continues to be afraid to eat because when she eats she usually has to go to the bathroom within 10-15 minutes for a bowel movement and then she gest the pain while on the toilet. She changes her body position (leaning backwards) sometimes helps a little stool come out. She continues to lose weight, she thinks now down to maybe 100 pounds although does not have a scale to confirm this.      Interval Events- 08/29/19 visit:   At our 05/21/19 visit I had referred the patient to colorectal surgery given the ongoing weight loss and severity of her symptoms. She then had the Uceris  approved and started it June 20, 2019 and did experience some improvement in symptoms so surgery was not pursued at the time. She reported constipation and was prescribed Miralax  in April 2021.      She is continuing to use Miralax  with sometimes loose stools. Appetite is increased on the steroids. She feels a lot better on the Uceris , not miserable and waking up overnight anymore. She still has some episodes of wanting to poop and not being able to poop. She's eating moderately. She has 3 more weeks of Uceris  left right now. Her weight has gone up, 113# up to  130.      Interval Events: 01/26/20  She had an ED visit 11/05/19 for abdominal pain. Mobic was refilled and gabapentin prescribed.      Lower abd pain, sometimes wrapping around to the back. Pelvic ultrasound was normal. Has been having problems with bowel movements. Has not received budesonide  this week. Entire lower abdomen. miralax  works only fair. Budesonide  was increasing  appetite, but eating makes pain worse. Throughout day there is a periodic ache. bm's 1-2 times per day, but has to lean back and forth to have bm's, normally 3-4 small piees in the toilet. Symptoms tend to be relieved with bowel movements. Weight is around 142.      Interval events: 01/29/20:  01/29/20 plan was to take Uceris  every other day for 3 months to see if it can be weaned, prescribed ongoing miralax  and a bowel preparation in case needed. Referred for anorectal manometry/biofeedback but insurance reportedly does not cover biofeedback. Taking miralax  every other day because daily use gives diarrhea. PCP tapering pain medications. Entocort 3mg  tabs prescribed     She's having problems with the budesonide . She is back to feeling how she was before starting steroids. Bowel habits have been loose and runny, back and forth to the bathroom all day, with stomach pains. Appetite has been low from stomach hurting, very bad, weight is now going down again. This happened when budesonide  changed from 9 to 3. Currently not taking miralax . No vomiting. Weight is now 134.      Interval Events 05/31/20:   Pain comes and goes, pants/breathes wih it. Happening 4-5 times per day. Pain is in the lower abdomen. Appetite increased a bit, weight coming back. Weight 141 now. Taking probiotic. Stools are smaller with some mucus. Stools are soft solids. Budesonide  helps with the appetite and keeping weight up but not with the abdominal pains.      Interval events 07/01/20  Thinks mesalamine  may be working. Weight is steady, 140. However still having lots of mucus  frequently during the day and with bowel movements. Feels like it helps mucus come out. Bowel movements now formed and length of her finger or slightly longer. Takes 2 budesonides 3mg  with mesalamine . She has questions about if ERCP is needed. Feels a problem with swallowing in the neck and throat, not in the chest.      07/19/20 phone call w blood in stool  08/16/20 course of metronidazole  for suspected SIBO  09/2020 colonoscopy: advised stop budesonide , +polyps, next colonoscopy recommended in 3 years.   11/2020 egd with esophagus biopsies normal  12/2020 labs suggestive of adrenal insufficiency  01/2021 bismuthfor change in smell of stool, not covered, tried 3 days metronidazole   10/13/21 endocrinology follow up, plan was decrease hydrocortisone  to 10/5, ok to intermittently hold afternoon dose, plan was to repeat acth and cortisol in 3-4 months  -12/13/21 telephone contact note: suspected flare of SCAD; 3 day course of flagyl ; still on mesalamine , stopped probiotic bc of expense, advised try peppermint oil  12/29/21 patienet called notifying that can't take flagyl  anymore  06/14/22 call in with abdominal pain"      Allergies/Contraindications   Allergen Reactions    Metronidazole  Other (See Comments)     Flares up yeast     Current Medications         Dosage    beclomethasone (QVAR  REDIHALER) 80 mcg/actuation breath activated inhaler As Needed    calcium  carbonate-vitamin D 600 mg(1,500mg ) -400 unit tablet     docusate sodium  (COLACE) 100 mg capsule Take 1 capsule (100 mg total) by mouth daily    HYDROcodone -acetaminophen  (NORCO) 5-325 mg tablet Take 1 tablet by mouth As Needed    hydrocortisone  (CORTEF ) 5 mg tablet 2.5 tabs PO Q AM and one tab PO Q PM    loratadine  (CLARITIN ) 10 mg tablet Take 1 tablet (10 mg total) by mouth in the morning.  lubiprostone  (AMITIZA ) 24 mcg capsule Take by mouth    mesalamine  (LIALDA ) 1.2 gram EC tablet TAKE 1 TABLET BY MOUTH EVERY DAY    omeprazole  (PRILOSEC) 20 mg capsule TAKE 1 CAPSULE  (20 MG) BY MOUTH IN THE MORNING AND 1 CAPSULE IN THE EVENING. TAKE BEFORE MEALS.    pregabalin  (LYRICA ) 75 mg capsule Take 1 capsule (75 mg total) by mouth daily as needed             Past Medical History:   Diagnosis Date    Abdominal pain 01/21/2019    Asthma     Bipolar 1 disorder (CMS code) 01/21/2019    Depression 01/21/2019    GERD (gastroesophageal reflux disease) 05/04/2016    History of motion sickness     PONV (postoperative nausea and vomiting)        Past Surgical History:   Procedure Laterality Date    CHOLECYSTECTOMY  04/26/2017    HYSTERECTOMY      right open carpal tunnel release (Right Wrist)  05/15/2018     Family History   Problem Relation Name Age of Onset    Malig hyperten Neg Hx      Malig hypertherm Neg Hx      Anesth problems Neg Hx      Bleeding disorder Neg Hx         Social History     Tobacco Use    Smoking status: Every Day     Current packs/day: 1.00     Average packs/day: 1 pack/day for 10.0 years (10.0 ttl pk-yrs)     Types: Cigarettes    Smokeless tobacco: Never    Tobacco comments:     3 cigarettes/day   Substance and Sexual Activity    Alcohol use: Yes     Alcohol/week: 4.0 - 5.0 standard drinks of alcohol     Types: 2 - 3 Glasses of wine, 2 Cans of beer per week    Drug use: Not Currently     Comment: cocaine 7 years ago           Objective              Physical Exam  Constitutional: Appears well, no apparent distress  HENT: Head: Normocephalic, atraumatic  Neck: no visible thyroid enlargement  Eyes: Extraocular movements intact  Pulmonary: Normal respiratory effort  Cardiovascular: No visible edema  Musculoskeletal: Appears normal, no swelling or deformity  Skin: Normal color and no visible rash  Neurologic: Alert and oriented x3, cranial nerves grossly intact  Psychiatric: normal mood, affect and behavior    Review of Prior Testing    08/2022 Colonoscopy:  Impression:         - Severe diverticulosis in the sigmoid colon but no visible inflammation.          - The perianal and  digital rectal examinations were normal.          -  Two 1 to 2 mm polyps in the ascending colon, removed with a cold biopsy             forceps.  Resected and retrieved.          -  Two 2 to 3 mm polyps in the transverse colon, removed with a cold biopsy             forceps.  Resected and retrieved.          -  The distal rectum and anal verge are normal on  retroflexion view.   Recommendation:         -  Await pathology results.          -  Repeat colonoscopy for surveillance based on pathology results.     FINAL PATHOLOGIC DIAGNOSIS     A. Ascending colon, polyps, biopsy:  Tubular adenoma.     B. Transverse colon, polyps, biopsy:  Tubular adenoma.    11/2020 EGD:  IMPRESSIONS:     1.  The entire examined duodenum appeared normal   2.  The mucosa of the stomach appeared normal   3.  The z-line appeared normal was located 36cm from the incisors   4.  The mucosa of the esophagus appeared normal   5.  Biopsies taken of the mid esophagus to rule out eosinphilic   esophagitis     RECOMMENDATIONS:    Await pathology results   REPEAT EXAM:     EGD as needed for clinical indication.     09/2020 colonoscopy:  IMPRESSIONS:     1.  11 mm sessile polyp was found in the ascending   colon; endoscopic mucosal resection was performed   2.  5 mm sessile polyp was found in the ascending colon; polypectomy   was performed   3.  Four 2 to 4mm mm in size polyps were found in the sigmoid colon;   polypectomy was performed with a cold snare   4.  Three 2 mm polyps were found in the rectum; polypectomy was   performed with a cold snare   5.  Severe non-bleeding diverticulosis was noted in the sigmoid colon   6.  Retroflexed views revealed internal hemorrhoids     RECOMMENDATIONS:     1.  I suspect the recent bleeding was from   internal hemorrhoids.  The diverticulosis did not have a lot of   inflammation today visually.  I recommend slowly going off of the   budesonide  medicine since there is not much visible inflammation but   continuing  the mesalamine .   2.  Avoid NSAIDS (over the counter pain killers, Aspirin, Ibuprofen,   Aleve, Motrin etc.) as well as blood thinners (Heparin, Coumadin   etc.) 7 day(s)   REPEAT COLONOSCOPY:     Polyps were found, therefore your next   colonoscopy will be determined based on your pathology results, and a   MyChart message will be sent to you with results and recommendations.     05/2023 CTAP:  IMPRESSION:      1.  No acute process in the abdomen and pelvis.      2.  Solid pulmonary nodule measuring up to 3 mm in the right middle lobe. Per Fleischner Society guidelines, if the patient is at low risk for lung cancer, no follow-up is required; if the patient is at high risk, follow-up with a low-dose non-contrast CT chest in 12 months is optional.     Assessment and Plan         This is a 79f with history of IBS, diverticulitis, SCAD, CCY (2019), depression substance abuse, GERD, hysterectomy who presents for follow up    # IBS-C  # SCAD  # GERD  # Colon polyps    Patient with abdominal pain with constipation, which improves with bowel movements. She tried Miralax  in the past but states it caused diarrhea. Started Amitiza  once daily with little effect, following last visit was dose escalated and recommended to take Amitizaz 24 mcg twice daily for better effect.  Despite this, she continues to have reports of incomplete emptying, straining and urinary incontinence. We discussed placing a referral to Urogynecology and Pelvic physiotherapy for further assessment. We also discussed performing a DRE for now given she is currently having fishy odor, which is likely from her known SCAD. Recommend warm sitz bath, good perianal hygiene, and adequate water  intake.     For her abdominal pain, she was prescribed Amitriptyline 25 mg by PCP for sleep. I discussed this medication can be effective in chronic abdominal pain management, nausea and sleep. The medication is best positioned at bedtime due to his drowsiness side effects.  Discussed monitoring for worsening mood, insomnia, or lethargy, as dose escalation in treatment may be warranted. Told her it can take 3-4 weeks for medication to take full effect.     Last colonoscopy 2024, revealing findings  two sessile polyps in the ascending colon (1-2 mm), two sessile polyps in one in the transverse colon (2-3 mm). Pathology consistent with tubular adenoma. We would recommend repeat CRC colonoscopy in 3 years, due 2027.      Recommendations:  -Continue mesalamine  2.4g/d for SCAD  -Schedule DRE  -Start Amitriptyline 25 mg at bedtime for sleep, abdominal pain ( managed by PCP); monitor for side effects  -Start sitz bath with warm water , perianal hygiene  -Continue Amitiza  24 mcg twice daily w/ meals  -Continue omeprazole  20 mg BID 30-60 min before meals; discuss tapering dosage in the future  -CRC colonoscopy due 2027  -Pelvic physiotherapy referral placed for ARM testing  -Uro gynecological referral placed for urinary incontinence/ leakage  - Avoid long wait times on toilet, can obtain squatty potty to assist in defecation  RTC: 3 months

## 2023-12-19 NOTE — Patient Instructions (Signed)
 It is a pleasure participating in [facility] care. Please do not hesitate to contact me should you have any questions regarding this visit. Below is my current recommendations:      Recommendations:  -Continue mesalamine  2.4g/d for SCAD  -Schedule Digital rectal examination  -Start Amitriptyline 25 mg at bedtime for sleep, abdominal pain ( managed by PCP); monitor for side effects  -Start sitz bath with warm water , perianal hygiene  -Continue Amitiza  24 mcg twice daily w/ meals  -Continue omeprazole  20 mg BID 30-60 min before meals; discuss tapering dosage in the future  -CRC colonoscopy due 2027  -Pelvic physiotherapy referral placed for ARM testing  -Uro gynecological referral placed for urinary incontinence/ leakage  - Avoid long wait times on toilet, can obtain squatty potty to assist in defecation   RTC: 3 months       The plan above was discussed with the patient, along with its benefits, risks, and alternatives. The patient had an opportunity to ask questions and all questions were answered. The patient expressed interest in proceeding with the plan and was appreciative of our care. An After Visit Summary was  given to the patient.  Thank you for the opportunity to care for this patient. Please do not hesitate to contact me with any questions or concerns.     As always, the patient was counseled to contact the GI clinic or the after-hours physician on call for any new symptoms or worsening symptoms or complications in the interim between now and the patient's next follow-up visit with me.      Thank you for involving me in the care of this very kind patient. Please do not hesitate to contact me with any questions or concerns.     Powell Parents, Nurse Practitioner   Gastroenterology  [address]  [place], [place] [address]  Phone [phone]  Fax [phone]

## 2023-12-21 NOTE — Telephone Encounter (Signed)
 Patient declined to schedule Anal Manometry appointment because she will need fleet prescribed so her insurance can pay for it due to being on a fixed income. She is not MyChart active but does have zoom and IVV link was sent so she cannot complete e check in and CPP document need to be mailed to her.

## 2023-12-25 ENCOUNTER — Telehealth
Admit: 2023-12-25 | Discharge: 2023-12-25 | Payer: Medicaid (Managed Care) | Attending: Nurse Practitioner | Primary: Physician

## 2023-12-25 DIAGNOSIS — M810 Age-related osteoporosis without current pathological fracture: Secondary | ICD-10-CM

## 2023-12-25 NOTE — Progress Notes (Signed)
 [facility] Follow Up Note:    Chelsea Ball returns in follow up of secondary adrenal insufficiency and osteoporosis.    Last visit: 10/31/2023 w/NP    Endocrine history:  (adapted from prior notes)  Pt saw [name] MD once and his note reports:   Chelsea Ball is a 63 y.o. woman who is referred for adrenal insufficiency.  eConsult by Dr. Melanee 11/04/20 reviewed  She has followed with [facility] Dr. Karleen since 2020. She has a hx of irritable bowel syndrome, diverticulitis, cholecystectomy, and was referred for abdominal pain. 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 She subsequently developed significant fatigue, and had labs checked which showed:     12/08/20 (746am) - ACTH < 5, cortisol 0.5     After these results, she was started on hydrocortisone  10/5. She feels her energy level has slightly improved, but is still continuing to feel overall fatigued and having difficulty sustaining energy throughout the day. She takes 10mg  around 10am, and then 5mg  around 1pm. We discussed more optimal timing of hydrocortisone  (Chelsea.g. 8-9am and 3-4pm), as well as stress dosing and obtaining a medical alert bracelet (provided order form for the bracelet). Also discussed the diagnosis of adrenal insufficiency and importance of consistently taking hydrocortisone .      She feels her heartburn has worsened recently and she is out of Nexium . Is having difficulty figuring out the right dosing of laxative to balance her constipation/diarrhea. No current abd pain, n/v, or lightheadedness. She is going on a cruise starting at the end of January 2023.     No headaches, no hx of immunotherapy/checkpoint-inhibitor treatments.     05/2021: MRI pituitary normal  Since that visit in 03/2021 pt next saw Chelsea  Aeron Lheureux NP 09/2021 on [facility] 15/5. Intermittently on opiates. Had been on lower dose  (GI upset, thin stools) improved on increase from 10/5 to 15/5. Actually takes it 15 mg PO Q AM. She has been told she has UC. Does not recall sick day management or what to do if vomiting.     DEXA: (Date: 10/2022, Facility:  [place], Manufacturer: H)  Region T-score Z-score % Change (*)   Lumbar Spine -2.5 -0.9 Baseline   Femoral Neck -1.5       Total Hip -1.4          At her first visit w Dr. Ina- 09/2022 I asked for pt to call GI clinic that day Dr. Karleen from GI to weigh in on whether tapering steroids was causing GI sxs to flare. Dr. Selvig recommended increase in mesalamine  briefly. In Sept Dr. Karleen recommended an increase in docusate for diverticulosis. Has new GI sched for next year in January. Has upper back pain. Is edentulous. Was waiting for referral for dental implants but is no longer planning this.   10/2022: 25-D 30, Ca 9.4    At her 01/2023 visit plan was for her to take hydrocortisone  15/5 mg BID and to get Reclast . She got it 02/2023 and now reports she had nausea for over a week. Seen 04/2023 at which time she was about to see new GI doctor (Dr. Dominique) and see about tapering steroids. Per his notes he felt her sxs were mainly due to IBS and she was given amitiza  and psyllium. They will follow up  next month. Today she reports difficulty turning head. Released from pain management clinic clinic for this because she refused steroid injections.     08/08/2023 visit- Taking reduced dose of hydrocortisone  to 12.5mg  daily, states she never took the afternoon dose- would miss it due to needing to take other meds.   Hs felt low energy for years, but has noticed no change in the past 2 weeks since dose reduction. Continues to experience "tummy troubles" every day. Saw Dr. Dominique last month and has been taking amitiza  and psyllium, not sure if it's helping or not.    08/29/2023 NP visit- Taking HC 12.5mg  daily, has not been  taking the afternoon dose for many months due to feeling overwhelmed with pill burden and timing certain meds w/food. Has been feeling more nausea and more vomiting frequently over the past month since decreasing from 15/5. Denies diarrhea, has small frequent stools.   Denies feeling faint, light headed or dizzy. Endorses low energy and fatigue.   Trying to drink 3 16 oz bottles of water  per day  Saw Dr. Dominique yesterday and was advised to increase her omeprazole  to twice daily.     10/03/2023- Taking HC 12.5/5mg - admits to forgetting to take the afternoon dose 2-3x a week      Interval history today 12/25/2023:  Presents over video for follow up.   Taking HC 10/5 BID, lowered since last visit 12.5/5. Has been experiencing some low appetite, no vomiting or diarrhea. Denies feeling faint, headache, light headedness. Admits to forgetting the afternoon dose a few days this past month but otherwise has remembered to take HC BID. No recent sick days, does not recall how to double dose but has not needed to recently. Does not have medical alert bracelet- will order one, asks if it is covered by medical.  Has chronic abdominal pain with intermittent constipation, she reports that the abdominal pain improves when she has bm. Has increased frequency of bms recently but not diarrhea. Taking Amitiza  and following closely with GI. Will be seeing Urogynecology and Pelvic physiotherapy for incomplete bowel emptying, straining and urinary incontinence.   Has not been sleeping well/worsening insomnia. Was prescribed amitryptiline for sleep by PCP, but has not started yet due to fear of feeling more exhausted/incoherent as she lives alone.     Other ongoing medical issues from the patient's PMH include   Past Medical History:   Diagnosis Date    Abdominal pain 01/21/2019    Asthma     Bipolar 1 disorder (CMS code) 01/21/2019    Depression 01/21/2019    GERD (gastroesophageal reflux disease) 05/04/2016    History of motion sickness      PONV (postoperative nausea and vomiting)        Current medications   Current Outpatient Medications   Medication Sig Dispense Refill    beclomethasone (QVAR  REDIHALER) 80 mcg/actuation breath activated inhaler As Needed      calcium  carbonate-vitamin D 600 mg(1,500mg ) -400 unit tablet       docusate sodium  (COLACE) 100 mg capsule Take 1 capsule (100 mg total) by mouth daily 90 capsule 3    HYDROcodone -acetaminophen  (NORCO) 5-325 mg tablet Take 1 tablet by mouth As Needed      hydrocortisone  (CORTEF ) 5 mg tablet 2.5 tabs PO Q AM and one tab PO Q PM 300 tablet 1    loratadine  (CLARITIN ) 10 mg tablet Take 1 tablet (10 mg total) by mouth in the morning.      lubiprostone  (AMITIZA )  24 mcg capsule Take by mouth      mesalamine  (LIALDA ) 1.2 gram EC tablet TAKE 1 TABLET BY MOUTH EVERY DAY 30 tablet 5    omeprazole  (PRILOSEC) 20 mg capsule TAKE 1 CAPSULE (20 MG) BY MOUTH IN THE MORNING AND 1 CAPSULE IN THE EVENING. TAKE BEFORE MEALS. 180 capsule 0    pregabalin  (LYRICA ) 75 mg capsule Take 1 capsule (75 mg total) by mouth daily as needed       No current facility-administered medications for this visit.       There have been no significant changes to the patient's PMH or FH.    SOC:   Social History     Socioeconomic History    Marital status: Single     Spouse name: Not on file    Number of children: Not on file    Years of education: Not on file    Highest education level: Not on file   Occupational History    Not on file   Tobacco Use    Smoking status: Every Day     Current packs/day: 1.00     Average packs/day: 1 pack/day for 10.0 years (10.0 ttl pk-yrs)     Types: Cigarettes    Smokeless tobacco: Never    Tobacco comments:     3 cigarettes/day   Substance and Sexual Activity    Alcohol use: Yes     Alcohol/week: 4.0 - 5.0 standard drinks of alcohol     Types: 2 - 3 Glasses of wine, 2 Cans of beer per week    Drug use: Not Currently     Comment: cocaine 7 years ago    Sexual activity: Not on file   Other Topics Concern     Not on file   Social History Narrative    Not on file     Social Drivers of Health     Financial Resource Strain: Not on File (11/27/2017)    Received from [employer]     Financial Resource Strain: 0   Food Insecurity: Not on File (01/04/2023)    Received from [employer] Insecurity     Food: 0   Transportation Needs: Not on File (11/27/2017)    Received from [employer] Needs     Transportation: 0   Housing Stability: Not on File (11/27/2017)    Received from [employer]     Housing: 0       VITAL SIGNS:   There were no vitals filed for this visit.      Physical Exam  Constitutional:       Appearance: Normal appearance.   HENT:      Head: Normocephalic.      Nose: Nose normal.   Cardiovascular:      Rate and Rhythm: Normal rate.   Pulmonary:      Effort: Pulmonary effort is normal.   Neurological:      General: No focal deficit present.      Mental Status: She is alert. Mental status is at baseline.   Skin:     General: Skin is warm and dry.      Capillary Refill: Capillary refill takes less than 2 seconds.   Psychiatric:         Mood and Affect: Mood normal.         Behavior: Behavior is cooperative.  Thought Content: Thought content normal.         Judgment: Judgment normal.   Vitals reviewed.         Records Reviewed Today:  - labs: I have reviewed    Latest Reference Range & Units 12/08/20 07:46 05/20/21 07:32 05/20/21 07:34 09/23/21 07:58   ACTH, Plasma 6 - 50 pg/mL <5 (L) <5 (L) <5 (L) <5 (L)   Cortisol, serum mcg/dL 0.5 (L) <9.4 (L) <9.4 (L) <0.5 (L)   Z-Score (Female) -2.0 - 2.0 SD   -1.0    IGF-1, Adult 50 - 317 ng/mL   79    Prolactin ng/mL   9.7    Free T4 0.8 - 1.8 ng/dL   0.9 1.1   Thyroid Stimulating Hormone 0.40 - 4.50 mIU/L   5.36 (H) 2.37     Component      Latest Ref Rng 08/16/2023 12/17/2023   ACTH, Plasma      6 - 50 pg/mL <5 (L)  <5 (L)    Cortisol, serum      mcg/dL 0.5 (L)  0.5 (L)         - imaging: I have reviewed  BONE DENSITOMETRY  REPORT 10/25/2022     L1-L4 Total  BMD values= 0.873   T-score= -2.5  Z-score= -0.9                          Total Femur  BMD values= 0.816  T-score= -1.4 Z-score= -0.6     Femur Neck  BMD values= 0.735  T-score= -1.5 Z-score= -0.4     Positioning, artifacts and morphologic findings:  Multilevel degenerative changes of the lumbar spine        Assessment & Recommendations:  Enid Maultsby returns for follow up of adrenal insufficiency and osteoporosis.  08/16/2023 ACTH <5, cortisol 0.5. Taking reduced dose HC 12.5mg  for the past 1 month. It was advised to take 12.5/5 BID, however she admits to forgetting and not taking the afternoon 5mg . Since the decrease in [facility] one month ago, she has felt increased nausea, vomiting, low energy/fatigue. Cortisol is low at 0.5, I recommend at this time add back the 5mg  afternoon dose- we discussed this at length and together we made a schedule of her meds to guide her through out the day/timing. She will take 12.5/5mg  for the next month, monitor symptoms and notify me with any concerns. We will plan to then slowly taper over time. She verbalizes understanding of the plan.     10/03/2023- Feeling well overall, has some intermittent headaches, otherwise no concerning sx of AI.   Taking HC 12.5/5, although admits to forgetting to take the afternoon dose 2-3 times per week. Recommend continue current dose 12.5/5 for now and will eval for dose adjustment/lowering at next visit.   - self care for AI discussed and info printed, given in AVS  - reminded her to obtain medical alert bracelet- she states she will do so    10/31/2023 Taking HC 12.5/5 and overall no concerning sx for AI.   Recommend trial of lowering HC to 10/5.  Repeat cortisol/ATCH in 3 weeks.  Sick day/AI self care reviewed today. She verbalizes understanding.     12/17/2023 ACTH <5, cortisol 0.5  12/24/2023- taking HC 10/5, this was lowered at last visit from 12.5/5. She has been feeling low appetite, some waves of intermittent nausea  and ongoing fatigue. No vomiting, no persistent abdominal pain- does have chronic intermittent  abd pain which is relieved w/bowel movements. Cortisol remains low, confirmed she did hold the AM dose prior to these labs. Given low cortisol and symptoms of low appetite, intermittent nausea, I recommend continue the same dose HC 10/5 and cont to monitor symptoms, will not lower dose at this time. Follow up in 3-4 weeks to consider lowering HC dose and slow taper.     Osteoporosis:  - no longer planning on dental implants  - #1 Reclast  02/27/2023- plan to repeat 02/2024      Follow up:  NP 1 month check in and Reclast  planning  07/2024 Dr. Ina Norris D. Lenon, NP  [facility]   12/25/23       APP Visit Information:   APP Service Type:  Independent  Available MD consultant:  Norris Girard Ina, MD  I attest that I have verbally informed the patient that I am an Advanced Practice Provider and explained my role in their team-based care.    I spent a total of 51 non-overlapping minutes on this patient's care on the day of their visit excluding time spent related to any billed procedures. This time includes time spent with the patient as well as time spent documenting in the medical record, reviewing patient's records and tests, obtaining history, placing orders, communicating with other healthcare professionals, counseling the patient, family, or caregiver, and/or care coordination for the diagnoses above.

## 2023-12-25 NOTE — Patient Instructions (Addendum)
 After Your Visit with [facility]:    Please continue your hydrocortisone  dose 10mg  in the am and 5mg  in the afternoon around 3pm.     Let's plan on seeing you again in 1 month.   Please have your labs drawn beforehand so we can review the results together. Labs have been ordered @ [facility].     Sincerely,  Almarie BIRCH. Lenon, NP  [facility]      ADRENAL INSUFFICIENCY  WHAT YOU NEED TO KNOW  Emergency Toolkit  Adrenal insufficiency is a condition where the body is unable to produce a normal supply of the hormone cortisol, and sometimes also can't produce the hormone aldosterone.  These hormones control blood pressure and metabolism.  Symptoms of adrenal insufficiency include feeling tired, dizzy, nauseated, feverish, and having belly pain.  Some people with adrenal insufficiency may also notice salt craving and darkening of the skin.  With proper treatment, the symptoms of adrenal insufficiency will not be present very often.    Treatment of adrenal insufficiency involves taking steroid hormone pills to replace cortisol (and aldosterone if needed).  Cortisol is replaced with prednisone or hydrocortisone .  Aldosterone is replaced with fludrocortisone.     Daily Treatment:  Take your prescribed medicines for adrenal insufficiency every day.     Sick day Rules for patients on steroids:  If you have adrenal insufficiency and are sick, be aware that you will need increased doses of your glucocorticoid (steroid) medication  If you are ill, for example- fever; illnesses for which you are on antibiotics such as pneumonia, urinary tract infections; diarrhea; nausea or vomiting- then you should double or triple the steroid dose (hydrocortisone , prednisone, or dexamethasone) while you are unwell.    Take a double dose for mild to moderate symptoms and a triple dose for severe symptoms   Contact your doctor if you are not better within 1 week. When you are better, return to your regular daily dose.   If you are  unable to keep down the oral steroid medicine and keep having vomiting and diarrhea, then this is an emergency and you should go to the local ER. If your doctor has given you a prescription for the hydrocortisone  injection (Solu-Cortef ), then you should give yourself the injection (or have a family member give you the injection) and then go straight to the ER.   When you are sick, you should drink sugar and salt-containing liquids to help prevent dehydration    Other Precautions:  Educate family members and/or friends about your condition  Carry the medical emergency card (sent from our clinic) indicating you have adrenal insufficiency  Additionally, you can wear a medical alert bracelet or necklace indicating you have adrenal insufficiency  If you are having an outpatient procedure such as colonoscopy, barium enema, arteriography, or minor surgery, then talk to the doctors performing the procedure about the steroids. Usually, you should double up the steroid on the day of the procedure and if you are back to normal, then can go back to your regular dose the following day.    Be aware of the following signs and symptoms of acute glucocorticoid deficiency:  Profound fatigue  Nausea/vomiting  Light headedness  Poor appetite/weight loss  Abdominal pain, loose stool    Below is a suggested list of important supplies that you may need in an emergency:  [  ]  Sufficient supply of hydrocortisone   [  ]  If you have primary adrenal insufficiency, sufficient supply of fludrocortisone   [  ]  Based on your doctor's discretion: Solu-Cortef  emergency injection kit including Solu-Cortef  vial, syringes, needles, alcohol swabs   [  ]  Medical emergency card  [  ]  Medical alert bracelet or necklace    For further questions/concerns, please  visit:  [email or web address]  [email or web address] [id number]    Contact Information for [facility]    Your lab results, if your lab tests are done at a [facility] lab, will be available through MyChart, a patient web portal to your health record at [facility].  Using MyChart, you can also request medication refills and send an electronic message to your physician if you have questions.  These messages are sent in secure fashion and are maintained as part of your health record.  MyChart is the best way to reach your physician if you have questions or issues in between office visits.     Our clinic staff can be reached by calling [phone] and our clinic fax number is [phone].     Radiology scheduling for imaging studies can be reached at [phone]       Nurse Triage Line: [phone], Monday-Friday 8:00am-4:30pm  Please call this number to report symptoms, or if you have any questions or concerns prior to your next appointment. Please have the following information available when calling: patient's name, date of birth or medical record number and provider's name.      After hours answering service: [phone]   If you have an urgent/emergent problem or have a question or concern after 5pm, on the weekend, or on a holiday, please call this number and ask the operator to page the on call Endocrinologist.     MyChart Messages  For your safety and best care, please DO NOT use MyChart messages to report symptoms. (Symptoms should be reported by calling the nurse triage line). Please use MyChart for non-urgent matters such as general questions, non-urgent prescription refills, or non-urgent scheduling issues.   - Please do not use MyChart for messages in the evenings or on weekends, as messages are only checked  during regular business hours.   - Please note that MyChart messages are routed to a central pool and one of your provider's team members will get back to you.  - Expect up to 3 or more business days for a response

## 2024-01-06 MED ORDER — HYDROCORTISONE 5 MG TABLET
5 | ORAL_TABLET | ORAL | 0 refills | Status: DC
Start: 2024-01-06 — End: 2024-04-19

## 2024-02-14 ENCOUNTER — Ambulatory Visit: Admit: 2024-02-14 | Payer: Medicaid (Managed Care) | Attending: Nurse Practitioner | Primary: Physician

## 2024-02-14 DIAGNOSIS — K581 Irritable bowel syndrome with constipation: Secondary | ICD-10-CM

## 2024-02-14 MED ORDER — ALBUTEROL SULFATE HFA 90 MCG/ACTUATION AEROSOL INHALER
90 | RESPIRATORY_TRACT | 1.50 refills | 25.00000 days | Status: AC | PRN
Start: 2024-02-14 — End: ?

## 2024-02-14 MED ORDER — AMITRIPTYLINE 10 MG TABLET
10 | Freq: Every day | ORAL | 1.00 refills | Status: AC
Start: 2024-02-14 — End: ?

## 2024-02-14 NOTE — Progress Notes (Deleted)
 6-20 bowel movements, down to 150lbs. Weight is decreasing because she can't eat, amitiza , still having complee emptying.     Pelvic urogology   Pelvic physiotherapy hyrughtuy    Some new rectal bleeding,. And weight loss, and abdominal cramping, now appetite so she does get nausea.     Mesalamin ( Lialda ) at dinner, liprisotine.            Pnatoprazole      Just started a,itrptylline-

## 2024-02-14 NOTE — Patient Instructions (Signed)
 It is a pleasure participating in [facility] care. Please do not hesitate to contact me should you have any questions regarding this visit. Below is my current recommendations:    Recommendations:  -Continue mesalamine  2.4g/d for SCAD  -Schedule colonoscopy w/ [facility] for further evaluation of rectal bleeding  -Start Amitriptyline 25 mg at bedtime for sleep, abdominal pain ( managed by PCP); monitor for side effects  -Continue Amitiza  24 mcg twice daily w/ meals  -Continue omeprazole  20 mg BID 30-60 min before meals; discuss tapering dosage in the future  -Follow up with Pelvic physiotherapy referral placed for ARM testing  -Follow up Uro gynecological referral placed for urinary incontinence/ leakage   - Avoid long wait times on toilet, can obtain squatty potty to assist in defecation  RTC: 6 months         The plan above was discussed with the patient, along with its benefits, risks, and alternatives. The patient had an opportunity to ask questions and all questions were answered. The patient expressed interest in proceeding with the plan and was appreciative of our care. An After Visit Summary was  given to the patient.  Thank you for the opportunity to care for this patient. Please do not hesitate to contact me with any questions or concerns.     As always, the patient was counseled to contact the GI clinic or the after-hours physician on call for any new symptoms or worsening symptoms or complications in the interim between now and the patient's next follow-up visit with me.      Thank you for involving me in the care of this very kind patient. Please do not hesitate to contact me with any questions or concerns.     Powell Parents, Nurse Practitioner  [facility]  [address], [address]  [place], [place] [address]  Phone [phone]  Fax [phone]

## 2024-02-14 NOTE — Progress Notes (Signed)
 APP Visit Information:   APP Service Type:  Independent  Available MD consultant:  Loyal Meter, MD  I attest that I have verbally informed the patient that I am an Advanced Practice Provider and explained my role in their team-based care.    I spent a total of 30 non-overlapping minutes on this patient's care on the day of their visit excluding time spent related to any billed procedures. This time includes time spent with the patient as well as time spent documenting in the medical record, reviewing patient's records and tests, obtaining history, placing orders, communicating with other healthcare professionals, counseling the patient, family, or caregiver, and/or care coordination for the diagnoses above.        Subjective    Chelsea Ball is a 63 y.o. female who presents with the following:        History of Present Illness     02/15/2024: This is a follow up appointment, Chelsea Ball reports with increased bowel movements though continue to be hard with straining and incomplete evacuation. She was referred to Pelvic Physiotherapy but has not heard from them regarding scheduling. She also continued to leak urine. In review, UroGyn was approved by insurance. She has been having weight loss, abdominal cramping and now new rectal bleeding. She continues pantoprazole twice daily. Just started amitriptyline 10 mg at bedtime instead of 25 mg due to fear of side effects. Currently she has had no side effects reports with this. She is due for colonoscopy 2027, but due to rectal bleeding she would like to be scheduled soon.          Previous Clinic Visits        12/19/2023 Chelsea Ball ): This is a follow up visit. Patient reports in follow up having ongoing lower abdominal pain, which resolves following bowel movement. Denies fever, nausea, or vomitting, chills.  She is having 1-5 bowel movements a day. She associated her bowel habits to be like small blobs ( [place] 4-5). She also reports significant straining with bowel  movements and incomplete emptying requiring multiple times to the bathroom. Requires some body manipulation with bending forward/ legs up. Denies rectal stimulation. Current bowel regimen includes: Amitiza  24 mcg twice daily w/ meals.     Also notes acid reflux, though controlled with omeprazole  20 mg twice daily, recently diagnosed with osteoporosis ( managed by [facility]) will be due for repeat biphosphates infusion in December.     Was recommended by PCP to start amitriptyline 25 mg for sleep,  but has not started yet. Hasn't had a DRE. Has some discharge from her rectum which smells like fish. Has been given a prescription for Bismuth  in the past. Prior reaction/ intolerance to budesonide  oral tablet leading to Adrenal insuffiencey. Would prefer to avoid stool testing at this time. Has not met with Pelvic physiotherapy.    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       08/28/23 Chelsea Ball):  This is follow up visit. In interim, had normal CTAP 05/2023. She started Amitiza  once daily in addition to continuing mesalamine . She has 2-3 BM/d without bleeding but still has mild abdominal discomfort. Unclear why didn't take twice daily Amatiza but would like new Rx. Hasn't received omeprazole  but GERD is stable. Overall feels ok but thinks stools are small which leads to more frequent trips to bathroom.     04/26/23 Chelsea Ball):  This is initial visit with me, previously followed with Dr. Karleen. In interim, continues to have abdominal pain and  constipation. Notes having 1-2 days per where she feels well without pain or constipation but the other days she has bilateral lower abdominal pain going to her back worsened with constipation. She notes she has to rock on the commode or distort her self to pass a bowel movement. Takes a stool softener but no other laxatives. Currently taking mesalamine  2.4g/d per day which was changed from 1.2g/d about 1 year ago. Denies having blood in the stool. Lastly has acid reflux at  times but is not taking omeprazole  regularly. No other acute complaints.    07/05/22 Chelsea Ball):  "This is a 63 y.o. female with history of irritable bowel syndrome with constipation, diverticulitis, cholecystectomy (2019), depression, substance use and hysterectomy who is referred to gastroenterology for diverticulosis with abdominal pain and incomplete colonoscopy, following up after colonoscopy was able to be completed.      Background history:  Referred from Dr. Elgin Endo in [place]. 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 When she eats she has to eat some fruit to have a bowel movement and it can be runny. The patient reports weight loss because she gets severe stomach pain when she eats. The pain is happening on a daily basis. The entire stomach is painful, not one particular area- she feels she has to pant (breathe heavily) which can calm the pain a little bit. She reports that she has been put on bed rest by Dr. Endo because of this. She has had weight loss related to eating less, she was 142 pounds, now down to 115 pounds. The only thing she can eat is soft foods, and reports she's not able to eat much. If she eats more she gets severe pain. She has some nausea but no vomiting. She reports being very weak now. She is also having rotator cuff problem. She reports having bowel movements that are runny - they used to be small, solid, curved, but now have transitioned to being runny. Some days she does not have a bowel movement, and some days she will have a few runny bowel movements. She has some fear of eating. There is no blood in the stools.      She has not seen a nutritionist, does not take any nutrition shakes. She has stopped taking any of her medications because of the abdominal pain,  uses Norco maybe once a week for rotator cuff, occasionally takes Calcium .     The patient reads me her medication list, although she is not curently taking these: Mirtazapine 15, megestrol 15, 25mg  hydroxyzine , 6mg  tizanadine PRN, calcium  +vitamin D, docusate, fluoxetine , lamotrigine , albuterol , QVAR , Norco 5. She takes esomeprazole  40mg  as needed for heartburn. She has tried FiberChoice tablets, didn't help her. Prior medicines from Dr. Endo gave her diarrhea. She tried miralax . She takes lactulose sometimes when constipated, helps her go. No enemas. Her prior medication list from June 2020 note of Dr. Endo also mentions several PPI's, as well as metamucil, Creon, Amitiza , Bentyl, despiramine, Donnatal, fleet's enemas, but she reports prior medicines prescribed by Dr. Endo just gave her diarrhea so she's not on them now.      She was seen in June 2020 by her gastroenterologist Dr. Endo who noted that he had attempted colonoscopy but was unable to advance past an area.      Her cholecystectomy did  not help her pain in 2019.      Note, utox has been positive for cocaine metabolites on 05/29/18, 12/25/16, ad 01/25/16.      02/28/19 phone visit:  At my initial clinic visit on 01/21/19, I planned to repeat colonoscopy with anesthesia. 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 I started the patient on mesalamine  2.4g daily which she did not tolerate after it causing sour stomach for a week.      At first she was having small bowel movements with postprandial abdominal pain. She is still having thin stools and postprandial discomfort that begins even right when she begins to eat a meal that has caused some fear of eating larger meals. At our 02/28/19 visit she is a little better but not much. Now she reports walking more after being off bed rest and is  having still thin bowel movements. She reports still not eating very well but thinks she has not lost further weight. She has not filled other medications with her PCP at this point. She is back on her psych meds. She takes opioids but only rarely, not every day.      05/21/19 visit:  At our 04/29/18 video visit, she was given a course of rifaximin  with some improvement initially but symptoms returned. She was prescribed a course of Uceris  but was not able to get this due to insurance for a couple of months.      At our 05/21/19 visit, she reports symptoms are getting worse. She has frequent small volume diarrhea, sometimes with urges to have a bowel movement but nothing comes out. When she does have bowel movments, it tends to be small bits or runny. She has had some episodes of incontinence. Some days she goes to the bathroom every 20 or 30 minutes and bowel movements are still very thin- bowel movements are like very long worms if it's not watery. Her abdominal pain is severe- reports it is worse than labor pains, intermittent spasms affecting the whole abdomen - she has to lay still and focus on her breathing to get the pain improve. It involves the whole abdomen and tends to happen when she is on the toilet trying to have a bowel movement. She continues to be afraid to eat because when she eats she usually has to go to the bathroom within 10-15 minutes for a bowel movement and then she gest the pain while on the toilet. She changes her body position (leaning backwards) sometimes helps a little stool come out. She continues to lose weight, she thinks now down to maybe 100 pounds although does not have a scale to confirm this.      Interval Events- 08/29/19 visit:   At our 05/21/19 visit I had referred the patient to colorectal surgery given the ongoing weight loss and severity of her symptoms. She then had the Uceris  approved and started it June 20, 2019 and did experience some improvement in symptoms so surgery was  not pursued at the time. She reported constipation and was prescribed Miralax  in April 2021.      She is continuing to use Miralax  with sometimes loose stools. Appetite is increased on the steroids. She feels a lot better on the Uceris , not miserable and waking up overnight anymore. She still has some episodes of wanting to poop and not being able to poop. She's  eating moderately. She has 3 more weeks of Uceris  left right now. Her weight has gone up, 113# up to 130.      Interval Events: 01/26/20  She had an ED visit 11/05/19 for abdominal pain. Mobic was refilled and gabapentin prescribed.      Lower abd pain, sometimes wrapping around to the back. Pelvic ultrasound was normal. Has been having problems with bowel movements. Has not received budesonide  this week. Entire lower abdomen. miralax  works only fair. Budesonide  was increasing appetite, but eating makes pain worse. Throughout day there is a periodic ache. bm's 1-2 times per day, but has to lean back and forth to have bm's, normally 3-4 small piees in the toilet. Symptoms tend to be relieved with bowel movements. Weight is around 142.      Interval events: 01/29/20:  01/29/20 plan was to take Uceris  every other day for 3 months to see if it can be weaned, prescribed ongoing miralax  and a bowel preparation in case needed. Referred for anorectal manometry/biofeedback but insurance reportedly does not cover biofeedback. Taking miralax  every other day because daily use gives diarrhea. PCP tapering pain medications. Entocort 3mg  tabs prescribed     She's having problems with the budesonide . She is back to feeling how she was before starting steroids. Bowel habits have been loose and runny, back and forth to the bathroom all day, with stomach pains. Appetite has been low from stomach hurting, very bad, weight is now going down again. This happened when budesonide  changed from 9 to 3. Currently not taking miralax . No vomiting. Weight is now 134.      Interval Events  05/31/20:   Pain comes and goes, pants/breathes wih it. Happening 4-5 times per day. Pain is in the lower abdomen. Appetite increased a bit, weight coming back. Weight 141 now. Taking probiotic. Stools are smaller with some mucus. Stools are soft solids. Budesonide  helps with the appetite and keeping weight up but not with the abdominal pains.      Interval events 07/01/20  Thinks mesalamine  may be working. Weight is steady, 140. However still having lots of mucus frequently during the day and with bowel movements. Feels like it helps mucus come out. Bowel movements now formed and length of her finger or slightly longer. Takes 2 budesonides 3mg  with mesalamine . She has questions about if ERCP is needed. Feels a problem with swallowing in the neck and throat, not in the chest.      07/19/20 phone call w blood in stool  08/16/20 course of metronidazole  for suspected SIBO  09/2020 colonoscopy: advised stop budesonide , +polyps, next colonoscopy recommended in 3 years.   11/2020 egd with esophagus biopsies normal  12/2020 labs suggestive of adrenal insufficiency  01/2021 bismuthfor change in smell of stool, not covered, tried 3 days metronidazole   10/13/21 endocrinology follow up, plan was decrease hydrocortisone  to 10/5, ok to intermittently hold afternoon dose, plan was to repeat acth and cortisol in 3-4 months  -12/13/21 telephone contact note: suspected flare of SCAD; 3 day course of flagyl ; still on mesalamine , stopped probiotic bc of expense, advised try peppermint oil  12/29/21 patienet called notifying that can't take flagyl  anymore  06/14/22 call in with abdominal pain"      Allergies/Contraindications   Allergen Reactions    Metronidazole  Other (See Comments)     Flares up yeast     Current Medications         Dosage    albuterol  90 mcg/actuation metered dose inhaler Inhale 2  puffs into the lungs every 4 (four) hours as needed    amitriptyline (ELAVIL) 10 mg tablet Take 1 tablet (10 mg total) by mouth nightly at bedtime     beclomethasone (QVAR  REDIHALER) 80 mcg/actuation breath activated inhaler As Needed    calcium  carbonate-vitamin D 600 mg(1,500mg ) -400 unit tablet     docusate sodium  (COLACE) 100 mg capsule Take 1 capsule (100 mg total) by mouth daily    HYDROcodone -acetaminophen  (NORCO) 5-325 mg tablet Take 1 tablet by mouth As Needed    hydrocortisone  (CORTEF ) 5 mg tablet 2 tabs PO Q AM and 1 tab PO Q 3 PM    loratadine  (CLARITIN ) 10 mg tablet Take 1 tablet (10 mg total) by mouth in the morning.    lubiprostone  (AMITIZA ) 24 mcg capsule Take by mouth    mesalamine  (LIALDA ) 1.2 gram EC tablet TAKE 1 TABLET BY MOUTH EVERY DAY    omeprazole  (PRILOSEC) 20 mg capsule TAKE 1 CAPSULE (20 MG) BY MOUTH IN THE MORNING AND 1 CAPSULE IN THE EVENING. TAKE BEFORE MEALS.    pregabalin  (LYRICA ) 75 mg capsule Take 1 capsule (75 mg total) by mouth daily as needed                   Objective        Temp: 36.7 C (98 F) (02/14/2024  9:33 AM)  BP: 109/74 (02/14/2024  9:33 AM)  Heart Rate: 90 (02/14/2024  9:33 AM)  *Resp: 16 (02/14/2024  9:33 AM)  Height: 154.9 cm (5\' 1" ) (02/14/2024  9:33 AM)  Weight: 68 kg (150 lb) (02/14/2024  9:33 AM)  BSA (Calculated - sq m): Mosteller Formula: 1.71 sq meters (02/14/2024  9:33 AM)         Physical Exam  Constitutional: Appears well, no apparent distress  HENT: Head: Normocephalic, atraumatic  Neck: no visible thyroid enlargement  Eyes: Extraocular movements intact  Pulmonary: Normal respiratory effort  Cardiovascular: No visible edema  Musculoskeletal: Appears normal, no swelling or deformity  Skin: Normal color and no visible rash  Neurologic: Alert and oriented x3, cranial nerves grossly intact  Psychiatric: normal mood, affect and behavior    Review of Prior Testing    08/2022 Colonoscopy:  Impression:         - Severe diverticulosis in the sigmoid colon but no visible inflammation.          - The perianal and digital rectal examinations were normal.          -  Two 1 to 2 mm polyps in the ascending colon, removed  with a cold biopsy             forceps.  Resected and retrieved.          -  Two 2 to 3 mm polyps in the transverse colon, removed with a cold biopsy             forceps.  Resected and retrieved.          -  The distal rectum and anal verge are normal on retroflexion view.   Recommendation:         -  Await pathology results.          -  Repeat colonoscopy for surveillance based on pathology results.     FINAL PATHOLOGIC DIAGNOSIS     A. Ascending colon, polyps, biopsy:  Tubular adenoma.     B. Transverse colon, polyps, biopsy:  Tubular adenoma.    11/2020 EGD:  IMPRESSIONS:     1.  The entire examined duodenum appeared normal   2.  The mucosa of the stomach appeared normal   3.  The z-line appeared normal was located 36cm from the incisors   4.  The mucosa of the esophagus appeared normal   5.  Biopsies taken of the mid esophagus to rule out eosinphilic   esophagitis     RECOMMENDATIONS:    Await pathology results   REPEAT EXAM:     EGD as needed for clinical indication.     09/2020 colonoscopy:  IMPRESSIONS:     1.  11 mm sessile polyp was found in the ascending   colon; endoscopic mucosal resection was performed   2.  5 mm sessile polyp was found in the ascending colon; polypectomy   was performed   3.  Four 2 to 4mm mm in size polyps were found in the sigmoid colon;   polypectomy was performed with a cold snare   4.  Three 2 mm polyps were found in the rectum; polypectomy was   performed with a cold snare   5.  Severe non-bleeding diverticulosis was noted in the sigmoid colon   6.  Retroflexed views revealed internal hemorrhoids     RECOMMENDATIONS:     1.  I suspect the recent bleeding was from   internal hemorrhoids.  The diverticulosis did not have a lot of   inflammation today visually.  I recommend slowly going off of the   budesonide  medicine since there is not much visible inflammation but   continuing the mesalamine .   2.  Avoid NSAIDS (over the counter pain killers, Aspirin, Ibuprofen,   Aleve, Motrin  etc.) as well as blood thinners (Heparin, Coumadin   etc.) 7 day(s)   REPEAT COLONOSCOPY:     Polyps were found, therefore your next   colonoscopy will be determined based on your pathology results, and a   MyChart message will be sent to you with results and recommendations.     05/2023 CTAP:  IMPRESSION:      1.  No acute process in the abdomen and pelvis.      2.  Solid pulmonary nodule measuring up to 3 mm in the right middle lobe. Per Fleischner Society guidelines, if the patient is at low risk for lung cancer, no follow-up is required; if the patient is at high risk, follow-up with a low-dose non-contrast CT chest in 12 months is optional.     Assessment and Plan         This is a 79f with history of IBS, diverticulitis, SCAD, CCY (2019), depression substance abuse, GERD, hysterectomy who presents for follow up    # IBS-C  # SCAD  # GERD  # Colon polyps    Patient with abdominal pain with constipation, which improves with bowel movements. She tried Miralax  in the past but states it caused diarrhea. Started Amitiza  once daily with little effect, following last visit was dose escalated and recommended to take Amitizaz 24 mcg twice daily for better effect. Despite this, she continues to have reports of incomplete emptying, straining and urinary incontinence. She was referred to Pelvic physiotherapy at last visit though no tentative appointment arranged. She was also approved to see Uro Gyn given recurrent urinary leakage this was also approved.    We were going to complete a DRE today, however, this was deferred by patient due to her ride share picking her up.     Hor  her abdominal pain, she recently started amitriptyline 10 mg with overall good response, she denies any unwarranted side effects. Her  last colonoscopy 2024, revealing findings  two sessile polyps in the ascending colon (1-2 mm), two sessile polyps in one in the transverse colon (2-3 mm). Pathology consistent with tubular adenoma. She reports today  with intermittent rectal bleeding, weight loss and low appetite. We discussed pursuing colonoscopy earlier given new symptoms.        Recommendations:  -Continue mesalamine  2.4g/d for SCAD  -Schedule colonoscopy w/ Mac for further evaluation of rectal bleeding  -Start Amitriptyline 25 mg at bedtime for sleep, abdominal pain ( managed by PCP); monitor for side effects  -Continue Amitiza  24 mcg twice daily w/ meals  -Continue omeprazole  20 mg BID 30-60 min before meals; discuss tapering dosage in the future  -Follow up with Pelvic physiotherapy referral placed for ARM testing  -Follow up Uro gynecological referral placed for urinary incontinence/ leakage   - Avoid long wait times on toilet, can obtain squatty potty to assist in defecation  RTC: 6 months

## 2024-02-15 MED ORDER — MESALAMINE 1.2 GRAM TABLET,DELAYED RELEASE
1.2 | ORAL_TABLET | Freq: Every day | ORAL | 5 refills | Status: AC
Start: 2024-02-15 — End: ?

## 2024-02-27 NOTE — Telephone Encounter (Signed)
 Patient/Caregiver calling for the following reason: Patient called to speak to NP Lenon regarding her infusion stated that she is due for one since she is having more back problems. Patient stated she is not going to wait until Dec to be seen. (Urgent)     Patient was scheduled on 01/29/24 -no showed due to no my chart active.     Preferred mode of communication is Phone number on file.  Preferred phone number:  [phone]    [name] to leave message Yes.    Last appointment:12/25/23    Next appointment: NA    Thank You,   Zacarias Stank  [facility]

## 2024-03-03 ENCOUNTER — Telehealth
Admit: 2024-03-03 | Discharge: 2024-03-03 | Payer: Medicaid (Managed Care) | Attending: Nurse Practitioner | Primary: Physician

## 2024-03-03 DIAGNOSIS — M810 Age-related osteoporosis without current pathological fracture: Secondary | ICD-10-CM

## 2024-03-03 NOTE — Progress Notes (Signed)
 [facility] Follow Up Note:    Chelsea Ball returns in follow up of secondary adrenal insufficiency and osteoporosis.    Last visit: 12/25/2023 w/NP    Endocrine history:  (adapted from prior notes)  Pt saw Chelsea Queen MD once and his note reports:   Chelsea Ball is a 63 y.o. woman who is referred for adrenal insufficiency.  eConsult by Dr. Melanee 11/04/20 reviewed  She has followed with [facility] Dr. Karleen since 2020. She has a hx of irritable bowel syndrome, diverticulitis, cholecystectomy, and was referred for abdominal pain. 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 She subsequently developed significant fatigue, and had labs checked which showed:     12/08/20 (746am) - ACTH < 5, cortisol 0.5     After these results, she was started on hydrocortisone  10/5. She feels her energy level has slightly improved, but is still continuing to feel overall fatigued and having difficulty sustaining energy throughout the day. She takes 10mg  around 10am, and then 5mg  around 1pm. We discussed more optimal timing of hydrocortisone  (e.g. 8-9am and 3-4pm), as well as stress dosing and obtaining a medical alert bracelet (provided order form for the bracelet). Also discussed the diagnosis of adrenal insufficiency and importance of consistently taking hydrocortisone .      She feels her heartburn has worsened recently and she is out of Nexium . Is having difficulty figuring out the right dosing of laxative to balance her constipation/diarrhea. No current abd pain, n/v, or lightheadedness. She is going on a cruise starting at the end of January 2023.     No headaches, no hx of immunotherapy/checkpoint-inhibitor treatments.     05/2021: MRI pituitary normal  Since that visit in 03/2021 pt next saw E  Modest Draeger NP 09/2021 on [facility] 15/5. Intermittently on opiates. Had been on lower dose  (GI upset, thin stools) improved on increase from 10/5 to 15/5. Actually takes it 15 mg PO Q AM. She has been told she has UC. Does not recall sick day management or what to do if vomiting.     DEXA: (Date: 10/2022, Facility:  [place], Manufacturer: H)  Region T-score Z-score % Change (*)   Lumbar Spine -2.5 -0.9 Baseline   Femoral Neck -1.5       Total Hip -1.4          At her first visit w Dr. Ina- 09/2022 I asked for pt to call GI clinic that day Dr. Karleen from GI to weigh in on whether tapering steroids was causing GI sxs to flare. Dr. Selvig recommended increase in mesalamine  briefly. In Sept Dr. Karleen recommended an increase in docusate for diverticulosis. Has new GI sched for next year in January. Has upper back pain. Is edentulous. Was waiting for referral for dental implants but is no longer planning this.   10/2022: 25-D 30, Ca 9.4    At her 01/2023 visit plan was for her to take hydrocortisone  15/5 mg BID and to get Reclast . She got it 02/2023 and now reports she had nausea for over a week. Seen 04/2023 at which time she was about to see new GI doctor (Dr. Dominique) and see about tapering steroids. Per his notes he felt her sxs were mainly due to IBS and she was given amitiza  and psyllium. They will follow up  next month. Today she reports difficulty turning head. Released from pain management clinic clinic for this because she refused steroid injections.     08/08/2023 visit- Taking reduced dose of hydrocortisone  to 12.5mg  daily, states she never took the afternoon dose- would miss it due to needing to take other meds.   Hs felt low energy for years, but has noticed no change in the past 2 weeks since dose reduction. Continues to experience "tummy troubles" every day. Saw Dr. Dominique last month and has been taking amitiza  and psyllium, not sure if it's helping or not.    08/29/2023 NP visit- Taking HC 12.5mg  daily, has not been  taking the afternoon dose for many months due to feeling overwhelmed with pill burden and timing certain meds w/food. Has been feeling more nausea and more vomiting frequently over the past month since decreasing from 15/5. Denies diarrhea, has small frequent stools.   Denies feeling faint, light headed or dizzy. Endorses low energy and fatigue.   Trying to drink 3 16 oz bottles of water  per day  Saw Dr. Dominique yesterday and was advised to increase her omeprazole  to twice daily.     10/03/2023- Taking HC 12.5/5mg - admits to forgetting to take the afternoon dose 2-3x a week    12/25/2023 visit- Taking HC 10/5 BID, lowered since last visit 12.5/5. Has been experiencing some low appetite, no vomiting or diarrhea. Denies feeling faint, headache, light headedness. Admits to forgetting the afternoon dose a few days this past month but otherwise has remembered to take HC BID. No recent sick days, does not recall how to double dose but has not needed to recently. Does not have medical alert bracelet- will order one, asks if it is covered by medical.  Has chronic abdominal pain with intermittent constipation, she reports that the abdominal pain improves when she has bm. Has increased frequency of bms recently but not diarrhea. Taking Amitiza  and following closely with GI. Will be seeing Urogynecology and Pelvic physiotherapy for incomplete bowel emptying, straining and urinary incontinence.   Has not been sleeping well/worsening insomnia. Was prescribed amitryptiline for sleep by PCP, but has not started yet due to fear of feeling more exhausted/incoherent as she lives alone.       Interval History today 03/03/24:  Presents for follow up, reports increased back pain recently related to lowering pain meds. Started PT for back pain and feels it is helping somewhat.  Denies any recent falls, no new fractures.  Received IV Reclast  one year ago for treatment of osteoporosis.  Taking Calcium  600mg  +D3 1 tab daily.    Eats broccoli  regularly, some ice cream  Dairy intake: minimal due to lactose intolerance    Dental: Has full dentures, she is interested in dental implants and will be visiting dentist soon     Taking HC 10/5, feeling her energy levels are very low, worsening fatigue recently. Not missing doses of HC.     Trying to remain active, walks 2-3 times daily and doing physical therapy.      Other ongoing medical issues from the patient's PMH include   Past Medical History:   Diagnosis Date    Abdominal pain 01/21/2019    Asthma     Bipolar 1 disorder (CMS code) 01/21/2019    Depression 01/21/2019    GERD (gastroesophageal reflux disease) 05/04/2016    History of motion sickness     PONV (postoperative nausea and vomiting)        Current medications  Current Outpatient Medications   Medication Sig Dispense Refill    albuterol  90 mcg/actuation metered dose inhaler Inhale 2 puffs into the lungs every 4 (four) hours as needed      amitriptyline  (ELAVIL ) 10 mg tablet Take 1 tablet (10 mg total) by mouth nightly at bedtime      beclomethasone (QVAR  REDIHALER) 80 mcg/actuation breath activated inhaler As Needed      calcium  carbonate-vitamin D 600 mg(1,500mg ) -400 unit tablet       docusate sodium  (COLACE) 100 mg capsule Take 1 capsule (100 mg total) by mouth daily 90 capsule 3    HYDROcodone -acetaminophen  (NORCO) 5-325 mg tablet Take 1 tablet by mouth As Needed      hydrocortisone  (CORTEF ) 5 mg tablet 2 tabs PO Q AM and 1 tab PO Q 3 PM 270 tablet 0    loratadine  (CLARITIN ) 10 mg tablet Take 1 tablet (10 mg total) by mouth in the morning.      lubiprostone  (AMITIZA ) 24 mcg capsule Take by mouth      mesalamine  (LIALDA ) 1.2 gram EC tablet TAKE 1 TABLET BY MOUTH EVERY DAY 30 tablet 5    omeprazole  (PRILOSEC) 20 mg capsule TAKE 1 CAPSULE (20 MG) BY MOUTH IN THE MORNING AND 1 CAPSULE IN THE EVENING. TAKE BEFORE MEALS. 180 capsule 0    pregabalin  (LYRICA ) 75 mg capsule Take 1 capsule (75 mg total) by mouth daily as needed       No current  facility-administered medications for this visit.       There have been no significant changes to the patient's PMH or FH.    SOC:   Social History     Socioeconomic History    Marital status: Single     Spouse name: Not on file    Number of children: Not on file    Years of education: Not on file    Highest education level: Not on file   Occupational History    Not on file   Tobacco Use    Smoking status: Every Day     Current packs/day: 1.00     Average packs/day: 1 pack/day for 10.0 years (10.0 ttl pk-yrs)     Types: Cigarettes    Smokeless tobacco: Never    Tobacco comments:     3 cigarettes/day   Substance and Sexual Activity    Alcohol use: Yes     Alcohol/week: 4.0 - 5.0 standard drinks of alcohol     Types: 2 - 3 Glasses of wine, 2 Cans of beer per week    Drug use: Not Currently     Comment: cocaine 7 years ago    Sexual activity: Not on file   Other Topics Concern    Not on file   Social History Narrative    Not on file     Social Drivers of Health     Financial Resource Strain: Not on File (11/27/2017)    Received from [employer]     Financial Resource Strain: 0   Food Insecurity: Not on File (01/04/2023)    Received from [employer] Insecurity     Food: 0   Transportation Needs: Not on File (11/27/2017)    Received from [employer] Needs     Transportation: 0   Housing Stability: Not on File (11/27/2017)    Received from [employer]     Housing: 0  VITAL SIGNS:   There were no vitals filed for this visit.      Physical Exam  Constitutional:       Appearance: Normal appearance.   HENT:      Head: Normocephalic.      Nose: Nose normal.   Cardiovascular:      Rate and Rhythm: Normal rate.   Pulmonary:      Effort: Pulmonary effort is normal.   Neurological:      General: No focal deficit present.      Mental Status: She is alert. Mental status is at baseline.   Skin:     General: Skin is warm and dry.      Capillary Refill: Capillary refill takes less  than 2 seconds.   Psychiatric:         Mood and Affect: Mood normal.         Behavior: Behavior is cooperative.         Thought Content: Thought content normal.         Judgment: Judgment normal.   Vitals reviewed.         Records Reviewed Today:  - labs: I have reviewed    Latest Reference Range & Units 12/08/20 07:46 05/20/21 07:32 05/20/21 07:34 09/23/21 07:58   ACTH, Plasma 6 - 50 pg/mL <5 (L) <5 (L) <5 (L) <5 (L)   Cortisol, serum mcg/dL 0.5 (L) <9.4 (L) <9.4 (L) <0.5 (L)   Z-Score (Female) -2.0 - 2.0 SD   -1.0    IGF-1, Adult 50 - 317 ng/mL   79    Prolactin ng/mL   9.7    Free T4 0.8 - 1.8 ng/dL   0.9 1.1   Thyroid Stimulating Hormone 0.40 - 4.50 mIU/L   5.36 (H) 2.37     Component      Latest Ref Rng 08/16/2023 12/17/2023   ACTH, Plasma      6 - 50 pg/mL <5 (L)  <5 (L)    Cortisol, serum      mcg/dL 0.5 (L)  0.5 (L)         - imaging: I have reviewed  BONE DENSITOMETRY REPORT 10/25/2022     L1-L4 Total  BMD values= 0.873   T-score= -2.5  Z-score= -0.9                          Total Femur  BMD values= 0.816  T-score= -1.4 Z-score= -0.6     Femur Neck  BMD values= 0.735  T-score= -1.5 Z-score= -0.4     Positioning, artifacts and morphologic findings:  Multilevel degenerative changes of the lumbar spine        Assessment & Recommendations:  Azzie Thiem returns for follow up of adrenal insufficiency and osteoporosis.  12/17/2023 ACTH <5, cortisol 0.5  02/2024- taking HC 10/5  She has been feeling low appetite. No vomiting, no persistent abdominal pain- does have chronic intermittent abd pain which is relieved w/bowel movements. Cortisol remains low, confirmed she did hold the AM dose prior to these labs. Given low cortisol and symptoms of low appetite, intermittent nausea, I recommend continue the same dose HC 10/5 and cont to monitor symptoms, will not lower dose at this time. Follow up in 3-4 weeks to consider lowering HC dose and slow taper.   - Received #1 Reclast  02/27/2023- plan to repeat now- around 02/2024.  She had planned to pursue dental implants (currently is edentulous and wears full dentures).  She has decided not to pursue implants and states she wants to proceed with IV Reclast . I did reiterate importance of dental work prior to repeat Reclast  dosing, now would be ideal, however, she states it is cost prohibitive and will not be getting implants now.   Plan:  Continue HC 10/5, self care reviewed  Reclast  labs now, ordered. Once reviewed, I will order IV Reclast  infusion      Follow up:  Jan 2026 NP  07/2024 Dr. Ina Norris D. Lenon, NP  [facility]   03/03/24       I performed this evaluation using real-time telehealth tools, including a live video Zoom connection between my location and the patient's location. Prior to initiating, the patient consented to perform this evaluation using telehealth tools.      APP Visit Information:   APP Service Type:  Independent  Available MD consultant:  Norris Girard Ina, MD  I attest that I have verbally informed the patient that I am an Advanced Practice Provider and explained my role in their team-based care.    I spent a total of 40 non-overlapping minutes on this patient's care on the day of their visit excluding time spent related to any billed procedures. This time includes time spent with the patient as well as time spent documenting in the medical record, reviewing patient's records and tests, obtaining history, placing orders, communicating with other healthcare professionals, counseling the patient, family, or caregiver, and/or care coordination for the diagnoses above.

## 2024-03-03 NOTE — Patient Instructions (Signed)
 After Your Visit with [facility]:    Very nice to speak with you today!  Please continue your hydrocortisone  dose 10mg  in the morning and 5mg  in the afternoon.   Please get your labs done at [facility] and once I review, I will then order the IV Reclast  infusion.   Let's plan on seeing you again in January 2026.        Sincerely,  Almarie BIRCH. Lenon, NP  [facility]      ADRENAL INSUFFICIENCY  WHAT YOU NEED TO KNOW  Emergency Toolkit  Adrenal insufficiency is a condition where the body is unable to produce a normal supply of the hormone cortisol, and sometimes also can't produce the hormone aldosterone.  These hormones control blood pressure and metabolism.  Symptoms of adrenal insufficiency include feeling tired, dizzy, nauseated, feverish, and having belly pain.  Some people with adrenal insufficiency may also notice salt craving and darkening of the skin.  With proper treatment, the symptoms of adrenal insufficiency will not be present very often.    Treatment of adrenal insufficiency involves taking steroid hormone pills to replace cortisol (and aldosterone if needed).  Cortisol is replaced with prednisone or hydrocortisone .  Aldosterone is replaced with fludrocortisone.     Daily Treatment:  Take your prescribed medicines for adrenal insufficiency every day.     Sick day Rules for patients on steroids:  If you have adrenal insufficiency and are sick, be aware that you will need increased doses of your glucocorticoid (steroid) medication  If you are ill, for example- fever; illnesses for which you are on antibiotics such as pneumonia, urinary tract infections; diarrhea; nausea or vomiting- then you should double or triple the steroid dose (hydrocortisone , prednisone, or dexamethasone) while you are unwell.    Take a double dose for mild to moderate symptoms and a triple dose for severe symptoms   Contact your doctor if you are not better within 1 week. When you are better, return to your regular  daily dose.   If you are unable to keep down the oral steroid medicine and keep having vomiting and diarrhea, then this is an emergency and you should go to the local ER. If your doctor has given you a prescription for the hydrocortisone  injection (Solu-Cortef ), then you should give yourself the injection (or have a family member give you the injection) and then go straight to the ER.   When you are sick, you should drink sugar and salt-containing liquids to help prevent dehydration    Other Precautions:  Educate family members and/or friends about your condition  Carry the medical emergency card (sent from our clinic) indicating you have adrenal insufficiency  Additionally, you can wear a medical alert bracelet or necklace indicating you have adrenal insufficiency  If you are having an outpatient procedure such as colonoscopy, barium enema, arteriography, or minor surgery, then talk to the doctors performing the procedure about the steroids. Usually, you should double up the steroid on the day of the procedure and if you are back to normal, then can go back to your regular dose the following day.    Be aware of the following signs and symptoms of acute glucocorticoid deficiency:  Profound fatigue  Nausea/vomiting  Light headedness  Poor appetite/weight loss  Abdominal pain, loose stool    Below is a suggested list of important supplies that you may need in an emergency:  [  ]  Sufficient supply of hydrocortisone   [  ]  If you have  primary adrenal insufficiency, sufficient supply of fludrocortisone   [  ]  Based on your doctor's discretion: Solu-Cortef  emergency injection kit including Solu-Cortef  vial, syringes, needles, alcohol swabs   [  ]  Medical emergency card  [  ]  Medical alert bracelet or necklace    For further questions/concerns, please  visit:  [email or web address]  [email or web address] [id number]    Contact Information for [facility]    Your lab results, if your lab tests are done at a [place] lab, will be available through MyChart, a patient web portal to your health record at [facility].  Using MyChart, you can also request medication refills and send an electronic message to your physician if you have questions.  These messages are sent in secure fashion and are maintained as part of your health record.  MyChart is the best way to reach your physician if you have questions or issues in between office visits.     Our clinic staff can be reached by calling [phone] and our clinic fax number is [phone].     Radiology scheduling for imaging studies can be reached at [phone]       Nurse Triage Line: [phone], Monday-Friday 8:00am-4:30pm  Please call this number to report symptoms, or if you have any questions or concerns prior to your next appointment. Please have the following information available when calling: patient's name, date of birth or medical record number and provider's name.      After hours answering service: [phone]   If you have an urgent/emergent problem or have a question or concern after 5pm, on the weekend, or on a holiday, please call this number and ask the operator to page the on call Endocrinologist.     MyChart Messages  For your safety and best care, please DO NOT use MyChart messages to report symptoms. (Symptoms should be reported by calling the nurse triage line). Please use MyChart for non-urgent matters such as general questions, non-urgent prescription refills, or non-urgent scheduling issues.   - Please do not use MyChart for messages in the evenings or on weekends, as messages are only checked  during regular business hours.   - Please note that MyChart messages are routed to a central pool and one of your provider's team members will get back to you.  - Expect up to 3 or more business days for a response

## 2024-03-05 MED ORDER — OMEPRAZOLE 20 MG CAPSULE,DELAYED RELEASE
20 | ORAL_CAPSULE | ORAL | 0 refills | 30.00000 days | Status: AC
Start: 2024-03-05 — End: ?

## 2024-03-12 LAB — VITAMIN D, 25-HYDROXY: Vitamin D, 25-Hydroxy: 32 ng/mL (ref 30–100)

## 2024-03-12 LAB — ALBUMIN, SERUM / PLASMA: Albumin, Serum / Plasma: 4.2 g/dL (ref 3.6–5.1)

## 2024-03-12 LAB — BASIC METABOLIC PANEL
BUN/Creatinine Ratio: 17 (calc) (ref 6–22)
Calcium, total, Serum / Plasma: 8.9 mg/dL (ref 8.6–10.4)
Carbon Dioxide, Total: 28 mmol/L (ref 20–32)
Chloride, Serum / Plasma: 104 mmol/L (ref 98–110)
Creatinine, Serum / Plasma: 0.52 mg/dL (ref 0.50–1.05)
Glucose: 93 mg/dL (ref 65–99)
Potassium, Serum / Plasma: 3.9 mmol/L (ref 3.5–5.3)
Sodium, Serum / Plasma: 140 mmol/L (ref 135–146)
Urea Nitrogen, serum/plasma: 9 mg/dL (ref 7–25)
eGFRcr: 105 mL/min/1.73m2 (ref >=60)

## 2024-03-12 LAB — PHOSPHORUS, SERUM / PLASMA: Phosphorus, serum/plasma: 3.4 mg/dL (ref 2.5–4.5)

## 2024-03-12 MED ORDER — CALCIUM 600 MG (AS CARBONATE)-VITAMIN D3 10 MCG (400 UNIT) TABLET
600 | ORAL_TABLET | Freq: Two times a day (BID) | ORAL | 1 refills | 53.00000 days | Status: AC
Start: 2024-03-12 — End: ?

## 2024-03-12 NOTE — Progress Notes (Signed)
 Labs reviewed, appropriate to proceed with Reclast . Patient confirms no plans for invasive dental work.  Ordered in therapy plan

## 2024-03-25 ENCOUNTER — Ambulatory Visit: Admit: 2024-03-25 | Discharge: 2017-08-22 | Payer: Medicaid (Managed Care) | Primary: Physician

## 2024-03-25 DIAGNOSIS — M810 Age-related osteoporosis without current pathological fracture: Secondary | ICD-10-CM

## 2024-03-25 MED ORDER — ZOLEDRONIC ACID 5 MG/100 ML IN MANNITOL 5 %-WATER INTRAVENOUS PIGGYBCK
5 | Freq: Once | INTRAVENOUS | Status: AC
Start: 2024-03-25 — End: 2024-03-25
  Administered 2024-03-25: 23:00:00 5 mg via INTRAVENOUS

## 2024-03-25 MED FILL — ZOLEDRONIC ACID 5 MG/100 ML IN MANNITOL 5 %-WATER INTRAVENOUS PIGGYBCK: 5 5 mg/100 mL | INTRAVENOUS | Qty: 100

## 2024-03-25 NOTE — RN Note (Signed)
 [facility] RN Note    Patient treatment: reclast     Patient arrived ambulatory.    Comment: No change from baseline.  Patient labs are within treatment parameter.    Patient completed treatment without incident. Patient aware of next infusion appointment date: 03/25/25. Patient discharged stable.

## 2024-04-18 ENCOUNTER — Telehealth
Admit: 2017-05-30 | Discharge: 2017-05-30 | Payer: Medicaid (Managed Care) | Attending: Nurse Practitioner | Primary: Physician

## 2024-04-18 DIAGNOSIS — K5900 Constipation, unspecified: Secondary | ICD-10-CM

## 2024-04-18 NOTE — Progress Notes (Signed)
 APP Visit Information:   APP Service Type:  Independent  Available MD consultant:  Erna JUDITHANN Righter, MD    I spent a total of 60 non-overlapping minutes on this patient's care on the day of their visit excluding time spent related to any billed procedures. This time includes time spent with the patient as well as time spent documenting in the medical record, reviewing patient's records and tests, obtaining history, placing orders, communicating with other healthcare professionals, counseling the patient, family, or caregiver, and/or care coordination for the diagnoses above.        I performed this evaluation using real-time telehealth tools, including a live video Zoom connection between my location and the patient's location. Prior to initiating, the patient consented to perform this evaluation using telehealth tools.       Chelsea Ball presents to the [facility] for evaluation of   Chief Complaint   Patient presents with    Constipation       HISTORY OF PRESENT ILLNESS:    Chelsea Ball is a 64 year old with a history of cholecystectomy. presenting to clinic at he referral of Dr. Dominique for a chief complaint of chronic constipation. History of constipation for  as long a she can remember.     Is feeling miserable and having problems with bowel movements. Has to rock back and forth to evacuate. Notes urinary incontinence as well. Frequency of bowel movement varies. At one point has not had a bowel movement for 3 days. Was feeling like she wanted to go, but couldn't evacuate. Some days goes 2-3 times a day. Was having trouble with pain meds and had to cut the norco out. Now takes it when pain is extreme, but didn't notice an improvement in bowel movements. Tries to spend less than 10 minutes on the toilet. Is aware it is not good to sit longer than 10 minutes. Pushes and strains to evacuate.     Is feeling miserable and homebound between constipation and urinary incontinence, and  yesterday while grocery shopping even experienced fecal incontinence.       Sample Diet:  B: 10 am oatmeal apples and cinnamon with a little butter    L: doesn't eat lunch; nibbles throughout the day; nibbles on peanut butter crackers, salami, hawaiian roll, eats in small amounts without sitting down and eating a meal; paces herself because is afraid of pain and doesn't want her stomach hurting   Nibbles throughout the day  Water  4 x 16 ounces of water  bottles     Relevant bowel function history:    Patterns of bowel movements: Bristol Scale:  short, separate pieces, not super hard, lately have been pretty soft   Sense of incomplete evacuation: Yes  Straining:  Yes   Pain during Bowel Movements Yes, peri-anal tissue felt raw  Bleeding during Bowel Movements Yes bright red on toilet paper, but not with stool  Fecal Urgency Yes  Fecal Incontinence Yes  Urinary Incontinence Yes    Routine use of pelvic floor exercises (kegels):  no    Use of Bowel aids(i.e. enema or suppositories) :  No  Use of Laxatives: doesn't like miralax  because it causes her straight diarrhea; now using just docusate sodium  100 mg softgel ;   Fiber supplements: None      Notes from Powell Parents, NP 12/19/23    12/19/2023 ETTER Powell ): This is a follow up visit. Patient reports in follow up having ongoing lower abdominal pain, which resolves  following bowel movement. Denies fever, nausea, or vomitting, chills.  She is having 1-5 bowel movements a day. She associated her bowel habits to be like small blobs ( Bristol 4-5). She also reports significant straining with bowel movements and incomplete emptying requiring multiple times to the bathroom. Requires some body manipulation with bending forward/ legs up. Denies rectal stimulation. Current bowel regimen includes: Amitiza  24 mcg twice daily w/ meals.      Also notes acid reflux, though controlled with omeprazole  20 mg twice daily, recently diagnosed with osteoporosis ( managed by [facility]) will  be due for repeat biphosphates infusion in December.      Was recommended by PCP to start amitriptyline  25 mg for sleep,  but has not started yet. Hasn't had a DRE. Has some discharge from her rectum which smells like fish. Has been given a prescription for Bismuth  in the past. Prior reaction/ intolerance to budesonide  oral tablet leading to Adrenal insuffiencey. Would prefer to avoid stool testing at this time. Has not met with Pelvic physiotherapy.     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         Previous Clinic Visits     08/28/23 Christina):  This is follow up visit. In interim, had normal CTAP 05/2023. She started Amitiza  once daily in addition to continuing mesalamine . She has 2-3 BM/d without bleeding but still has mild abdominal discomfort. Unclear why didn't take twice daily Amatiza but would like new Rx. Hasn't received omeprazole  but GERD is stable. Overall feels ok but thinks stools are small which leads to more frequent trips to bathroom.      04/26/23 Christina):  This is initial visit with me, previously followed with Dr. Karleen. In interim, continues to have abdominal pain and constipation. Notes having 1-2 days per where she feels well without pain or constipation but the other days she has bilateral lower abdominal pain going to her back worsened with constipation. She notes she has to rock on the commode or distort her self to pass a bowel movement. Takes a stool softener but no other laxatives. Currently taking mesalamine  2.4g/d per day which was changed from 1.2g/d about 1 year ago. Denies having blood in the stool. Lastly has acid reflux at times but is not taking omeprazole  regularly. No other acute complaints.     07/05/22 Justin):  "This is a 64 y.o. female with history of irritable bowel syndrome with constipation, diverticulitis, cholecystectomy (2019), depression, substance use and hysterectomy who is referred to gastroenterology for diverticulosis with abdominal pain and  incomplete colonoscopy, following up after colonoscopy was able to be completed.      Background history:  Referred from Dr. Elgin Endo in [place]. 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 When she eats she has to eat some fruit to have a bowel movement and it can be runny. The patient reports weight loss because she gets severe stomach pain when she eats. The pain is happening on a daily basis. The entire stomach is painful, not one particular area- she feels she has to pant (breathe heavily) which can calm the pain a little bit. She reports that she has been put on bed rest by Dr. Endo because of this. She has had weight loss related to eating less, she was 142 pounds, now down to  115 pounds. The only thing she can eat is soft foods, and reports she's not able to eat much. If she eats more she gets severe pain. She has some nausea but no vomiting. She reports being very weak now. She is also having rotator cuff problem. She reports having bowel movements that are runny - they used to be small, solid, curved, but now have transitioned to being runny. Some days she does not have a bowel movement, and some days she will have a few runny bowel movements. She has some fear of eating. There is no blood in the stools.      She has not seen a nutritionist, does not take any nutrition shakes. She has stopped taking any of her medications because of the abdominal pain, uses Norco maybe once a week for rotator cuff, occasionally takes Calcium .     The patient reads me her medication list, although she is not curently taking these: Mirtazapine 15, megestrol 15, 25mg  hydroxyzine , 6mg  tizanadine PRN, calcium  +vitamin D, docusate, fluoxetine , lamotrigine , albuterol , QVAR , Norco 5. She takes esomeprazole  40mg  as  needed for heartburn. She has tried FiberChoice tablets, didn't help her. Prior medicines from Dr. Andra gave her diarrhea. She tried miralax . She takes lactulose sometimes when constipated, helps her go. No enemas. Her prior medication list from June 2020 note of Dr. Andra also mentions several PPI's, as well as metamucil, Creon, Amitiza , Bentyl, despiramine, Donnatal, fleet's enemas, but she reports prior medicines prescribed by Dr. Andra just gave her diarrhea so she's not on them now.      She was seen in June 2020 by her gastroenterologist Dr. Andra who noted that he had attempted colonoscopy but was unable to advance past an area.      Her cholecystectomy did not help her pain in 2019.      Note, utox has been positive for cocaine metabolites on 05/29/18, 12/25/16, ad 01/25/16.      02/28/19 phone visit:  At my initial clinic visit on 01/21/19, I planned to repeat colonoscopy with anesthesia. 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 I started the patient on mesalamine  2.4g daily which she did not tolerate after it causing sour stomach for a week.      At first she was having small bowel movements with postprandial abdominal pain. She is still having thin stools and postprandial discomfort that begins even right when she begins to eat a meal that has caused some fear of eating larger meals. At our 02/28/19 visit she is a little better but not much. Now she reports walking more after being off bed rest and is having still thin bowel movements. She reports still not eating very well but thinks she has not lost further weight. She has not filled other medications with her PCP at this point. She is back on her psych meds. She takes opioids but only rarely, not every day.      05/21/19 visit:  At our 04/29/18 video visit, she was given a course of rifaximin   with some improvement initially but symptoms returned. She was prescribed a course of Uceris  but was not able to get this due to insurance for a couple of months.      At our 05/21/19 visit, she reports symptoms are getting worse. She has frequent small volume diarrhea, sometimes with urges to have a bowel movement  but nothing comes out. When she does have bowel movments, it tends to be small bits or runny. She has had some episodes of incontinence. Some days she goes to the bathroom every 20 or 30 minutes and bowel movements are still very thin- bowel movements are like very long worms if it's not watery. Her abdominal pain is severe- reports it is worse than labor pains, intermittent spasms affecting the whole abdomen - she has to lay still and focus on her breathing to get the pain improve. It involves the whole abdomen and tends to happen when she is on the toilet trying to have a bowel movement. She continues to be afraid to eat because when she eats she usually has to go to the bathroom within 10-15 minutes for a bowel movement and then she gest the pain while on the toilet. She changes her body position (leaning backwards) sometimes helps a little stool come out. She continues to lose weight, she thinks now down to maybe 100 pounds although does not have a scale to confirm this.      Interval Events- 08/29/19 visit:   At our 05/21/19 visit I had referred the patient to colorectal surgery given the ongoing weight loss and severity of her symptoms. She then had the Uceris  approved and started it June 20, 2019 and did experience some improvement in symptoms so surgery was not pursued at the time. She reported constipation and was prescribed Miralax  in April 2021.      She is continuing to use Miralax  with sometimes loose stools. Appetite is increased on the steroids. She feels a lot better on the Uceris , not miserable and waking up overnight anymore. She still has some episodes of wanting to poop and not being  able to poop. She's eating moderately. She has 3 more weeks of Uceris  left right now. Her weight has gone up, 113# up to 130.      Interval Events: 01/26/20  She had an ED visit 11/05/19 for abdominal pain. Mobic was refilled and gabapentin prescribed.      Lower abd pain, sometimes wrapping around to the back. Pelvic ultrasound was normal. Has been having problems with bowel movements. Has not received budesonide  this week. Entire lower abdomen. miralax  works only fair. Budesonide  was increasing appetite, but eating makes pain worse. Throughout day there is a periodic ache. bm's 1-2 times per day, but has to lean back and forth to have bm's, normally 3-4 small piees in the toilet. Symptoms tend to be relieved with bowel movements. Weight is around 142.      Interval events: 01/29/20:  01/29/20 plan was to take Uceris  every other day for 3 months to see if it can be weaned, prescribed ongoing miralax  and a bowel preparation in case needed. Referred for anorectal manometry/biofeedback but insurance reportedly does not cover biofeedback. Taking miralax  every other day because daily use gives diarrhea. PCP tapering pain medications. Entocort 3mg  tabs prescribed     She's having problems with the budesonide . She is back to feeling how she was before starting steroids. Bowel habits have been loose and runny, back and forth to the bathroom all day, with stomach pains. Appetite has been low from stomach hurting, very bad, weight is now going down again. This happened when budesonide  changed from 9 to 3. Currently not taking miralax . No vomiting. Weight is now 134.      Interval Events 05/31/20:   Pain comes and goes, pants/breathes wih it. Happening 4-5 times per day.  Pain is in the lower abdomen. Appetite increased a bit, weight coming back. Weight 141 now. Taking probiotic. Stools are smaller with some mucus. Stools are soft solids. Budesonide  helps with the appetite and keeping weight up but not with the abdominal pains.       Interval events 07/01/20  Thinks mesalamine  may be working. Weight is steady, 140. However still having lots of mucus frequently during the day and with bowel movements. Feels like it helps mucus come out. Bowel movements now formed and length of her finger or slightly longer. Takes 2 budesonides 3mg  with mesalamine . She has questions about if ERCP is needed. Feels a problem with swallowing in the neck and throat, not in the chest.      07/19/20 phone call w blood in stool  08/16/20 course of metronidazole  for suspected SIBO  09/2020 colonoscopy: advised stop budesonide , +polyps, next colonoscopy recommended in 3 years.   11/2020 egd with esophagus biopsies normal  12/2020 labs suggestive of adrenal insufficiency  01/2021 bismuthfor change in smell of stool, not covered, tried 3 days metronidazole   10/13/21 endocrinology follow up, plan was decrease hydrocortisone  to 10/5, ok to intermittently hold afternoon dose, plan was to repeat acth and cortisol in 3-4 months  -12/13/21 telephone contact note: suspected flare of SCAD; 3 day course of flagyl ; still on mesalamine , stopped probiotic bc of expense, advised try peppermint oil  12/29/21 patienet called notifying that can't take flagyl  anymore  06/14/22 call in with abdominal pain"    Patient Active Problem List   Diagnosis    Anxiety    Asthma    Back pain    Bipolar 1 disorder (CMS code)    Cervical stenosis of spinal canal    Chronic low back pain with bilateral sciatica    Depression    Eczema    GERD (gastroesophageal reflux disease)    Gastroduodenitis    Drug-induced constipation    Bilateral carpal tunnel syndrome    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)    Moderate persistent asthma    Neck pain    Neuropathy, 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, right    Osteoarthritis of shoulder    Paresthesia and pain of both upper extremities    Polyarthritis    Schizophrenia (CMS code)    Stress incontinence in female    Tobacco user    Vitamin D deficiency    Abdominal  pain    History of colonoscopy    Segmental colitis associated with diverticulosis (CMS code)    Osteoporosis       Past Medical History:   Diagnosis Date    Abdominal pain 01/21/2019    Asthma     Bipolar 1 disorder (CMS code) 01/21/2019    Depression 01/21/2019    GERD (gastroesophageal reflux disease) 05/04/2016    History of motion sickness     PONV (postoperative nausea and vomiting)        Past Surgical History:   Procedure Laterality Date    CHOLECYSTECTOMY  04/26/2017    ENDO ADULT COLONOSCOPY WITH BIOPSY N/A 01/28/2019    Performed by Toribio Grayce Dupre, MD at [facility] - ENDOSCOPY OR - [address]    ENDO ADULT COLONOSCOPY WITH INJECTION THERAPY N/A 09/20/2020    Performed by Toribio Grayce Dupre, MD at [facility] - ENDOSCOPY OR - [address]    ENDO ADULT COLONOSCOPY WITH POLYPECTOMY N/A 08/25/2022    Performed by Toribio Grayce Dupre, MD at [facility] - ENDOSCOPY OR -  [address]    ENDO ADULT COLONOSCOPY WITH POLYPECTOMY N/A 09/20/2020    Performed by Toribio Grayce Dupre, MD at [facility] - ENDOSCOPY OR - [address]    ENDO ADULT COLONOSCOPY WITH POLYPECTOMY N/A 01/28/2019    Performed by Toribio Grayce Dupre, MD at [facility] - ENDOSCOPY OR - [address]    ENDO ADULT EGD WITH BIOPSY N/A 11/23/2020    Performed by Toribio Grayce Dupre, MD at [facility] - ENDOSCOPY OR    HYSTERECTOMY      right open carpal tunnel release (Right Wrist)  05/15/2018        Allergies/Contraindications   Allergen Reactions    Metronidazole  Other (See Comments)     Flares up yeast       Current Outpatient Medications   Medication Sig Dispense Refill    albuterol  90 mcg/actuation metered dose inhaler Inhale 2 puffs into the lungs every 4 (four) hours as needed      amitriptyline  (ELAVIL ) 10 mg tablet Take 1 tablet (10 mg total) by mouth nightly at bedtime      beclomethasone (QVAR  REDIHALER) 80 mcg/actuation breath activated inhaler As Needed      calcium  carbonate-vitamin D 600 mg-10 mcg (400 unit) tablet Take 1 tablet by mouth in the  morning and 1 tablet in the evening. 180 tablet 1    docusate sodium  (COLACE) 100 mg capsule Take 1 capsule (100 mg total) by mouth daily 90 capsule 3    HYDROcodone -acetaminophen  (NORCO) 5-325 mg tablet Take 1 tablet by mouth As Needed      hydrocortisone  (CORTEF ) 5 mg tablet 2 tabs PO Q AM and 1 tab PO Q 3 PM 270 tablet 0    loratadine  (CLARITIN ) 10 mg tablet Take 1 tablet (10 mg total) by mouth in the morning.      lubiprostone  (AMITIZA ) 24 mcg capsule Take by mouth      mesalamine  (LIALDA ) 1.2 gram EC tablet TAKE 1 TABLET BY MOUTH EVERY DAY 30 tablet 5    omeprazole  (PRILOSEC) 20 mg capsule TAKE 1 CAPSULE (20 MG) BY MOUTH IN THE MORNING AND 1 CAPSULE IN THE EVENING. TAKE BEFORE MEALS. 180 capsule 0    pregabalin  (LYRICA ) 75 mg capsule Take 1 capsule (75 mg total) by mouth daily as needed       No current facility-administered medications for this visit.       There are no discontinued medications.      Social History     Socioeconomic History    Marital status: Single     Spouse name: Not on file    Number of children: Not on file    Years of education: Not on file    Highest education level: Not on file   Tobacco Use    Smoking status: Every Day     Current packs/day: 1.00     Average packs/day: 1 pack/day for 10.0 years (10.0 ttl pk-yrs)     Types: Cigarettes    Smokeless tobacco: Never    Tobacco comments:     3 cigarettes/day   Substance and Sexual Activity    Alcohol use: Yes     Alcohol/week: 4.0 - 5.0 standard drinks of alcohol     Types: 2 - 3 Glasses of wine, 2 Cans of beer per week    Drug use: Not Currently     Comment: cocaine 7 years ago    Sexual activity: Not on file   Other Topics Concern  Not on file   Social History Narrative    Not on file       Family History   Problem Relation Name Age of Onset    Malig hyperten Neg Hx      Malig hypertherm Neg Hx      Anesth problems Neg Hx      Bleeding disorder Neg Hx         There were no vitals filed for this visit.           STUDIES:     No results  found.   Results for orders placed or performed during the hospital encounter of 09/20/20   Endoscopy, colon, diagnostic    Impression    COLONOSCOPY PROCEDURE REPORT     EXAM DATE: 09/20/2020    PATIENT NAME:      Marquetta, Weiskopf           MR #:      [id number]  BIRTHDATE:       1960-06-24  ATTENDING:     Toribio Dupre M.D.  ASSISTANT:    INDICATIONS:  The patient is a 64 yr old female here for a colonoscopy  due to Abdominal pain and bright red blood per rectum  PROCEDURE PERFORMED:     Colonoscopy, diagnostic, Colonoscopy with  snare, and Colonoscopy with EMR  MEDICATIONS:  Nasal oxygen,   MAC    DESCRIPTION OF PROCEDURE:  During intra-op preparation period all  mechanical & medical equipment was checked for proper function. Hand  hygiene and appropriate measures for infection prevention was taken.  A physicial examination was performed.  After the risks, benefits and  alternatives of the procedure and sedation were thoroughly explained,  informed consent was verified, confirmed and timeout was successfully  executed by the treatment team.  The [id number]-[id number] ([id number]) endoscope was  introduced through the anus and advanced to the cecum, which was  identified by both the appendiceal orifice and ileocecal valve.  The  scope was then completely withdrawn from the patient and the  procedure terminated.    QUALITY OF PREP:  Adequate for the indication and for the standard  follow up guidelines The Boston Bowel Prep Score was [facility] Scale  Right colon 3, Transverse colon 3, and Left colon 3.  Total BBPS = 9.      A digital rectal exam was performed and revealed no abnormalities of  the rectum.  COLON FINDINGS: A 11 mm sessile polyp was found in the ascending  colon.  Endoscopic mucosal resection was performed by injecting  saline into the submucosa to raise the lesion.  Resection was then  performed with snare cautery.  The polyp lifted well after submucosal  injection.  The resection was complete, the polyp tissue  was  completely retrieved and sent to histology.  There was no 'target  sign' at EMR site.  There was no blood loss from polypectomy.   A 5  mm sessile polyp was found in the ascending colon.  A polypectomy was  performed using a cold snare.  The resection was complete and the  polyp tissue was completely retrieved.   Four 2 to in size  sessile polyps were found in the sigmoid colon.  A polypectomy was  performed with a cold snare.  The resection was complete and the  polyp tissue was completely retrieved.   Three 2 mm superficial  elevated polyps were found in the rectum.  A polypectomy was  performed with a cold  snare.  The resection was complete and the  polyp tissue was completely retrieved.   There was severe  non-bleeding diverticulosis noted in the sigmoid colon.   Retroflexed  views revealed internal hemorrhoids.  Otherwise the examination was  normal.        ESTIMATED BLOOD LOSS:     None  COMPLICATIONS:      There were no complications.  IMPRESSIONS:     1.  11 mm sessile polyp was found in the ascending  colon; endoscopic mucosal resection was performed  2.  5 mm sessile polyp was found in the ascending colon; polypectomy  was performed  3.  Four 2 to 4mm mm in size polyps were found in the sigmoid colon;  polypectomy was performed with a cold snare  4.  Three 2 mm polyps were found in the rectum; polypectomy was  performed with a cold snare  5.  Severe non-bleeding diverticulosis was noted in the sigmoid colon  6.  Retroflexed views revealed internal hemorrhoids    RECOMMENDATIONS:     1.  I suspect the recent bleeding was from  internal hemorrhoids.  The diverticulosis did not have a lot of  inflammation today visually.  I recommend slowly going off of the  budesonide  medicine since there is not much visible inflammation but  continuing the mesalamine .  2.  Avoid NSAIDS (over the counter pain killers, Aspirin, Ibuprofen,  Aleve, Motrin etc.) as well as blood thinners (Heparin, Coumadin  etc.) 7  day(s)  REPEAT COLONOSCOPY:     Polyps were found, therefore your next  colonoscopy will be determined based on your pathology results, and a  MyChart message will be sent to you with results and recommendations.      The attending physician performed the entire procedure.    _____________________________  Toribio Dupre M.D.  Activated:  09/20/2020 14:31      cc:            Reviewed by:     Toribio Dupre M.D.        PATIENT NAME:  Ahmyah, Gidley  MR#: [id number]              Recent Surgical Pathology  (Last result in the past 365 days)      None            ASSESSMENT AND PLAN:  Anorectal Manometry testing is recommended based on Ms. Clune's history of constipation. Anorectal Manometry testing will evaluate for strength and relaxation of the muscles used in defecation, external anal sphincter muscle and nerve activity, ability to sense stool in the rectum, coordination of the muscles used in defecation and balloon expulsion test. This appointment will also include a digital rectal exam.          I spent a total of 60 minutes on this patient's care on the day of their visit excluding time spent related to any billed procedures. This time includes time spent with the patient as well as time spent documenting in the medical record, reviewing patient's records and tests, obtaining history, placing orders, communicating with other healthcare professionals, counseling the patient, family, or caregiver, and/or care coordination for the diagnoses above.

## 2024-04-18 NOTE — Patient Instructions (Signed)
 It was a pleasure to see you today, Chelsea Ball.  Below are some of the recommendations we discussed today. Please do not hesitate to reach out to me via MyChart with any questions.       Instructions on enema administration. As part of preparation for your visit for anorectal manometry, we ask that you administer an enema if you have not had a bowel movement either the night before or the morning of your appointment.    Follow the instructions on packaging. However, instead of laying down on your side, administer the enema while sitting (or pseudo-squatting) over the toilet. Once the enema tip is inside of you, squeeze as much of the fluid inside, remove the enema tip and sit down over the toilet. Do not try to hold the fluid in. Just relax and allow the fluid and any stool to come out along with it. If needed, you may repeat this step by refilling the enema squeeze bottle with warm tap water , making sure it is not cold or hot.         Please schedule your anorectal manometry appointment.

## 2024-04-19 MED ORDER — HYDROCORTISONE 5 MG TABLET
5 | ORAL_TABLET | ORAL | 0 refills | Status: AC
Start: 2024-04-19 — End: ?
